# Patient Record
Sex: Female | Born: 1955
Health system: Southern US, Community
[De-identification: ages and names within clinical notes are randomized; demographics above are authoritative.]

## PROBLEM LIST (undated history)

## (undated) DIAGNOSIS — E785 Hyperlipidemia, unspecified: Secondary | ICD-10-CM

## (undated) DIAGNOSIS — I10 Essential (primary) hypertension: Secondary | ICD-10-CM

## (undated) DIAGNOSIS — R7309 Other abnormal glucose: Secondary | ICD-10-CM

## (undated) DIAGNOSIS — L659 Nonscarring hair loss, unspecified: Secondary | ICD-10-CM

## (undated) DIAGNOSIS — M1710 Unilateral primary osteoarthritis, unspecified knee: Secondary | ICD-10-CM

## (undated) DIAGNOSIS — R7303 Prediabetes: Secondary | ICD-10-CM

## (undated) DIAGNOSIS — E119 Type 2 diabetes mellitus without complications: Secondary | ICD-10-CM

## (undated) DIAGNOSIS — M179 Osteoarthritis of knee, unspecified: Secondary | ICD-10-CM

## (undated) DIAGNOSIS — N83209 Unspecified ovarian cyst, unspecified side: Secondary | ICD-10-CM

## (undated) DIAGNOSIS — E559 Vitamin D deficiency, unspecified: Secondary | ICD-10-CM

## (undated) DIAGNOSIS — D259 Leiomyoma of uterus, unspecified: Secondary | ICD-10-CM

## (undated) HISTORY — DX: Type 2 diabetes mellitus without complications: E11.9

## (undated) HISTORY — DX: Essential (primary) hypertension: I10

## (undated) HISTORY — DX: Hyperlipidemia, unspecified: E78.5

## (undated) HISTORY — PX: COLONOSCOPY: SHX174

## (undated) HISTORY — DX: Other abnormal glucose: R73.09

## (undated) HISTORY — PX: CYSTECTOMY: SUR359

## (undated) HISTORY — PX: FOOT SURGERY: SHX648

---

## 1998-08-23 ENCOUNTER — Ambulatory Visit (HOSPITAL_COMMUNITY): Admission: RE | Admit: 1998-08-23 | Discharge: 1998-08-23 | Payer: Self-pay | Admitting: Obstetrics

## 1998-08-23 ENCOUNTER — Encounter: Payer: Self-pay | Admitting: Obstetrics

## 1999-05-17 ENCOUNTER — Other Ambulatory Visit: Admission: RE | Admit: 1999-05-17 | Discharge: 1999-05-17 | Payer: Self-pay | Admitting: Obstetrics

## 1999-06-06 ENCOUNTER — Encounter: Payer: Self-pay | Admitting: Urology

## 1999-06-06 ENCOUNTER — Ambulatory Visit (HOSPITAL_COMMUNITY): Admission: RE | Admit: 1999-06-06 | Discharge: 1999-06-06 | Payer: Self-pay | Admitting: Urology

## 1999-10-19 ENCOUNTER — Ambulatory Visit (HOSPITAL_COMMUNITY): Admission: RE | Admit: 1999-10-19 | Discharge: 1999-10-19 | Payer: Self-pay | Admitting: Obstetrics

## 1999-10-19 ENCOUNTER — Encounter: Payer: Self-pay | Admitting: Obstetrics

## 2000-05-06 ENCOUNTER — Other Ambulatory Visit: Admission: RE | Admit: 2000-05-06 | Discharge: 2000-05-06 | Payer: Self-pay | Admitting: Obstetrics

## 2000-11-08 ENCOUNTER — Ambulatory Visit (HOSPITAL_COMMUNITY): Admission: RE | Admit: 2000-11-08 | Discharge: 2000-11-08 | Payer: Self-pay | Admitting: Obstetrics

## 2000-11-08 ENCOUNTER — Encounter: Payer: Self-pay | Admitting: Obstetrics

## 2001-02-28 ENCOUNTER — Encounter: Payer: Self-pay | Admitting: Obstetrics

## 2001-02-28 ENCOUNTER — Ambulatory Visit (HOSPITAL_COMMUNITY): Admission: RE | Admit: 2001-02-28 | Discharge: 2001-02-28 | Payer: Self-pay | Admitting: Obstetrics

## 2001-09-09 ENCOUNTER — Encounter: Payer: Self-pay | Admitting: Obstetrics

## 2001-09-09 ENCOUNTER — Inpatient Hospital Stay (HOSPITAL_COMMUNITY): Admission: AD | Admit: 2001-09-09 | Discharge: 2001-09-09 | Payer: Self-pay | Admitting: Obstetrics

## 2001-11-12 ENCOUNTER — Ambulatory Visit (HOSPITAL_COMMUNITY): Admission: RE | Admit: 2001-11-12 | Discharge: 2001-11-12 | Payer: Self-pay | Admitting: Obstetrics

## 2001-11-12 ENCOUNTER — Encounter: Payer: Self-pay | Admitting: Obstetrics

## 2002-11-26 ENCOUNTER — Encounter: Payer: Self-pay | Admitting: Obstetrics and Gynecology

## 2002-11-26 ENCOUNTER — Ambulatory Visit (HOSPITAL_COMMUNITY): Admission: RE | Admit: 2002-11-26 | Discharge: 2002-11-26 | Payer: Self-pay | Admitting: Obstetrics and Gynecology

## 2002-12-07 ENCOUNTER — Other Ambulatory Visit: Admission: RE | Admit: 2002-12-07 | Discharge: 2002-12-07 | Payer: Self-pay | Admitting: Obstetrics and Gynecology

## 2003-02-19 ENCOUNTER — Encounter: Payer: Self-pay | Admitting: Obstetrics and Gynecology

## 2003-02-19 ENCOUNTER — Ambulatory Visit (HOSPITAL_COMMUNITY): Admission: RE | Admit: 2003-02-19 | Discharge: 2003-02-19 | Payer: Self-pay | Admitting: Obstetrics and Gynecology

## 2003-03-04 ENCOUNTER — Encounter: Payer: Self-pay | Admitting: Obstetrics and Gynecology

## 2003-03-04 ENCOUNTER — Ambulatory Visit (HOSPITAL_COMMUNITY): Admission: RE | Admit: 2003-03-04 | Discharge: 2003-03-04 | Payer: Self-pay | Admitting: Obstetrics and Gynecology

## 2003-05-26 ENCOUNTER — Observation Stay (HOSPITAL_COMMUNITY): Admission: RE | Admit: 2003-05-26 | Discharge: 2003-05-27 | Payer: Self-pay | Admitting: Obstetrics and Gynecology

## 2003-05-26 ENCOUNTER — Encounter (INDEPENDENT_AMBULATORY_CARE_PROVIDER_SITE_OTHER): Payer: Self-pay

## 2003-05-30 ENCOUNTER — Inpatient Hospital Stay (HOSPITAL_COMMUNITY): Admission: AD | Admit: 2003-05-30 | Discharge: 2003-05-30 | Payer: Self-pay | Admitting: Obstetrics and Gynecology

## 2003-06-26 HISTORY — PX: INCONTINENCE SURGERY: SHX676

## 2003-12-08 ENCOUNTER — Ambulatory Visit (HOSPITAL_COMMUNITY): Admission: RE | Admit: 2003-12-08 | Discharge: 2003-12-08 | Payer: Self-pay | Admitting: Obstetrics and Gynecology

## 2004-01-04 ENCOUNTER — Other Ambulatory Visit: Admission: RE | Admit: 2004-01-04 | Discharge: 2004-01-04 | Payer: Self-pay | Admitting: Obstetrics and Gynecology

## 2004-06-25 DIAGNOSIS — E785 Hyperlipidemia, unspecified: Secondary | ICD-10-CM

## 2004-06-25 HISTORY — DX: Hyperlipidemia, unspecified: E78.5

## 2004-12-22 ENCOUNTER — Ambulatory Visit (HOSPITAL_COMMUNITY): Admission: RE | Admit: 2004-12-22 | Discharge: 2004-12-22 | Payer: Self-pay | Admitting: Obstetrics and Gynecology

## 2005-01-08 ENCOUNTER — Other Ambulatory Visit: Admission: RE | Admit: 2005-01-08 | Discharge: 2005-01-08 | Payer: Self-pay | Admitting: Obstetrics and Gynecology

## 2005-01-19 ENCOUNTER — Ambulatory Visit: Payer: Self-pay | Admitting: Internal Medicine

## 2005-12-19 ENCOUNTER — Ambulatory Visit: Payer: Self-pay | Admitting: Internal Medicine

## 2005-12-25 ENCOUNTER — Ambulatory Visit (HOSPITAL_COMMUNITY): Admission: RE | Admit: 2005-12-25 | Discharge: 2005-12-25 | Payer: Self-pay | Admitting: Obstetrics and Gynecology

## 2006-06-11 ENCOUNTER — Ambulatory Visit: Payer: Self-pay | Admitting: Internal Medicine

## 2006-06-11 LAB — CONVERTED CEMR LAB
Creatinine,U: 178.7 mg/dL
Microalb, Ur: 0.7 mg/dL (ref 0.0–1.9)
Potassium: 4 meq/L (ref 3.5–5.1)

## 2006-12-30 ENCOUNTER — Ambulatory Visit (HOSPITAL_COMMUNITY): Admission: RE | Admit: 2006-12-30 | Discharge: 2006-12-30 | Payer: Self-pay | Admitting: Obstetrics and Gynecology

## 2006-12-30 ENCOUNTER — Telehealth (INDEPENDENT_AMBULATORY_CARE_PROVIDER_SITE_OTHER): Payer: Self-pay | Admitting: *Deleted

## 2006-12-31 ENCOUNTER — Ambulatory Visit: Payer: Self-pay | Admitting: Internal Medicine

## 2007-06-03 ENCOUNTER — Encounter: Payer: Self-pay | Admitting: Internal Medicine

## 2008-01-01 ENCOUNTER — Ambulatory Visit (HOSPITAL_COMMUNITY): Admission: RE | Admit: 2008-01-01 | Discharge: 2008-01-01 | Payer: Self-pay | Admitting: Obstetrics and Gynecology

## 2008-02-26 ENCOUNTER — Ambulatory Visit: Payer: Self-pay | Admitting: Internal Medicine

## 2008-02-26 DIAGNOSIS — E1169 Type 2 diabetes mellitus with other specified complication: Secondary | ICD-10-CM | POA: Insufficient documentation

## 2008-02-26 DIAGNOSIS — E785 Hyperlipidemia, unspecified: Secondary | ICD-10-CM | POA: Insufficient documentation

## 2008-02-26 DIAGNOSIS — E1159 Type 2 diabetes mellitus with other circulatory complications: Secondary | ICD-10-CM | POA: Insufficient documentation

## 2008-02-26 DIAGNOSIS — I10 Essential (primary) hypertension: Secondary | ICD-10-CM | POA: Insufficient documentation

## 2008-02-26 LAB — CONVERTED CEMR LAB
HDL goal, serum: 40 mg/dL
LDL Goal: 160 mg/dL

## 2008-02-28 LAB — CONVERTED CEMR LAB
AST: 29 units/L (ref 0–37)
Alkaline Phosphatase: 67 units/L (ref 39–117)
BUN: 14 mg/dL (ref 6–23)
Basophils Absolute: 0.1 10*3/uL (ref 0.0–0.1)
Basophils Relative: 0.8 % (ref 0.0–3.0)
Bilirubin, Direct: 0.1 mg/dL (ref 0.0–0.3)
Calcium: 9.2 mg/dL (ref 8.4–10.5)
Chloride: 108 meq/L (ref 96–112)
Eosinophils Relative: 0.9 % (ref 0.0–5.0)
HCT: 36.7 % (ref 36.0–46.0)
Hemoglobin: 12.6 g/dL (ref 12.0–15.0)
Hgb A1c MFr Bld: 6 % (ref 4.6–6.0)
Lymphocytes Relative: 15.7 % (ref 12.0–46.0)
Neutro Abs: 7.4 10*3/uL (ref 1.4–7.7)
Platelets: 266 10*3/uL (ref 150–400)
Potassium: 4 meq/L (ref 3.5–5.1)
RBC: 4.07 M/uL (ref 3.87–5.11)
Sodium: 141 meq/L (ref 135–145)
TSH: 2.06 microintl units/mL (ref 0.35–5.50)
Total Protein: 7.3 g/dL (ref 6.0–8.3)

## 2008-03-02 ENCOUNTER — Encounter (INDEPENDENT_AMBULATORY_CARE_PROVIDER_SITE_OTHER): Payer: Self-pay | Admitting: *Deleted

## 2008-03-05 ENCOUNTER — Ambulatory Visit: Payer: Self-pay | Admitting: Internal Medicine

## 2008-03-05 LAB — CONVERTED CEMR LAB
OCCULT 1: NEGATIVE
OCCULT 2: NEGATIVE

## 2008-03-08 ENCOUNTER — Encounter (INDEPENDENT_AMBULATORY_CARE_PROVIDER_SITE_OTHER): Payer: Self-pay | Admitting: *Deleted

## 2008-09-29 ENCOUNTER — Telehealth (INDEPENDENT_AMBULATORY_CARE_PROVIDER_SITE_OTHER): Payer: Self-pay | Admitting: *Deleted

## 2009-01-06 ENCOUNTER — Ambulatory Visit (HOSPITAL_COMMUNITY): Admission: RE | Admit: 2009-01-06 | Discharge: 2009-01-06 | Payer: Self-pay | Admitting: Obstetrics and Gynecology

## 2009-01-17 ENCOUNTER — Ambulatory Visit: Payer: Self-pay | Admitting: Internal Medicine

## 2009-03-16 ENCOUNTER — Ambulatory Visit: Payer: Self-pay | Admitting: Internal Medicine

## 2009-03-21 ENCOUNTER — Encounter (INDEPENDENT_AMBULATORY_CARE_PROVIDER_SITE_OTHER): Payer: Self-pay | Admitting: *Deleted

## 2009-03-21 LAB — CONVERTED CEMR LAB
Alkaline Phosphatase: 72 units/L (ref 39–117)
Basophils Absolute: 0 10*3/uL (ref 0.0–0.1)
Basophils Relative: 0.6 % (ref 0.0–3.0)
Bilirubin, Direct: 0 mg/dL (ref 0.0–0.3)
Eosinophils Relative: 2.9 % (ref 0.0–5.0)
HCT: 36.9 % (ref 36.0–46.0)
HDL: 43.1 mg/dL (ref >39.00)
Lymphs Abs: 2.2 10*3/uL (ref 0.7–4.0)
MCHC: 33.8 g/dL (ref 30.0–36.0)
MCV: 89.9 fL (ref 78.0–100.0)
Monocytes Absolute: 0.5 10*3/uL (ref 0.1–1.0)
Neutro Abs: 2.6 10*3/uL (ref 1.4–7.7)
TSH: 2.88 microintl units/mL (ref 0.35–5.50)
Total Bilirubin: 0.6 mg/dL (ref 0.3–1.2)
Total Protein: 7 g/dL (ref 6.0–8.3)
Triglycerides: 61 mg/dL (ref 0.0–149.0)

## 2009-03-24 ENCOUNTER — Ambulatory Visit: Payer: Self-pay | Admitting: Internal Medicine

## 2009-03-24 DIAGNOSIS — R7303 Prediabetes: Secondary | ICD-10-CM

## 2009-03-24 DIAGNOSIS — E119 Type 2 diabetes mellitus without complications: Secondary | ICD-10-CM | POA: Insufficient documentation

## 2009-05-24 ENCOUNTER — Ambulatory Visit (HOSPITAL_BASED_OUTPATIENT_CLINIC_OR_DEPARTMENT_OTHER): Admission: RE | Admit: 2009-05-24 | Discharge: 2009-05-24 | Payer: Self-pay | Admitting: Surgery

## 2009-06-25 HISTORY — PX: LIPOMA EXCISION: SHX5283

## 2010-01-12 ENCOUNTER — Ambulatory Visit (HOSPITAL_COMMUNITY): Admission: RE | Admit: 2010-01-12 | Discharge: 2010-01-12 | Payer: Self-pay | Admitting: Obstetrics and Gynecology

## 2010-04-19 ENCOUNTER — Ambulatory Visit: Payer: Self-pay | Admitting: Internal Medicine

## 2010-04-19 DIAGNOSIS — IMO0002 Reserved for concepts with insufficient information to code with codable children: Secondary | ICD-10-CM | POA: Insufficient documentation

## 2010-04-20 ENCOUNTER — Ambulatory Visit: Payer: Self-pay | Admitting: Internal Medicine

## 2010-04-21 LAB — CONVERTED CEMR LAB
CO2: 29 meq/L (ref 19–32)
Calcium: 8.9 mg/dL (ref 8.4–10.5)
Glucose, Bld: 83 mg/dL (ref 70–99)
Sodium: 134 meq/L — ABNORMAL LOW (ref 135–145)

## 2010-06-22 ENCOUNTER — Ambulatory Visit: Payer: Self-pay | Admitting: Internal Medicine

## 2010-06-22 ENCOUNTER — Encounter: Payer: Self-pay | Admitting: Internal Medicine

## 2010-06-22 ENCOUNTER — Ambulatory Visit
Admission: RE | Admit: 2010-06-22 | Discharge: 2010-06-22 | Payer: Self-pay | Source: Home / Self Care | Attending: Internal Medicine | Admitting: Internal Medicine

## 2010-06-22 DIAGNOSIS — R635 Abnormal weight gain: Secondary | ICD-10-CM | POA: Insufficient documentation

## 2010-06-22 DIAGNOSIS — M546 Pain in thoracic spine: Secondary | ICD-10-CM | POA: Insufficient documentation

## 2010-06-23 LAB — CONVERTED CEMR LAB
Albumin: 3.5 g/dL (ref 3.5–5.2)
Alkaline Phosphatase: 92 units/L (ref 39–117)
Basophils Absolute: 0 10*3/uL (ref 0.0–0.1)
Cholesterol: 164 mg/dL (ref 0–200)
Eosinophils Relative: 1.5 % (ref 0.0–5.0)
HDL: 43.6 mg/dL (ref >39.00)
Hemoglobin: 13.7 g/dL (ref 12.0–15.0)
LDL Cholesterol: 104 mg/dL — ABNORMAL HIGH (ref 0–99)
Lymphs Abs: 1.9 10*3/uL (ref 0.7–4.0)
MCHC: 33.1 g/dL (ref 30.0–36.0)
MCV: 89.1 fL (ref 78.0–100.0)
Monocytes Absolute: 0.5 10*3/uL (ref 0.1–1.0)
Neutro Abs: 3.2 10*3/uL (ref 1.4–7.7)
RDW: 15.1 % — ABNORMAL HIGH (ref 11.5–14.6)
TSH: 1.89 microintl units/mL (ref 0.35–5.50)
Total CHOL/HDL Ratio: 4
WBC: 5.7 10*3/uL (ref 4.5–10.5)

## 2010-07-25 NOTE — Assessment & Plan Note (Signed)
Summary: INCREASED BP--DISCUSS MED/KB   Vital Signs:  Patient profile:   55 year old female Weight:      256 pounds BMI:     44.10 Temp:     98.6 degrees F oral Pulse rate:   76 / minute Resp:     15 per minute BP sitting:   136 / 84  (left arm) Cuff size:   large  Vitals Entered By: Shonna Chock CMA (April 19, 2010 4:16 PM) CC: Increased B/P   CC:  Increased B/P.  History of Present Illness: Hypertension Follow-Up      This is a 55 year old woman who presents for Hypertension follow-up.  Her BP was 160/ 90@ Gyn. The patient reports urinary frequency( she D/Ced HCTZ in 12/2009), but denies lightheadedness, headaches, and fatigue.  The patient denies the following associated symptoms: chest pain, chest pressure, exercise intolerance, dyspnea, palpitations, syncope, leg edema, and pedal edema.  Compliance with medications (by patient report) has been sporadic, missing doses.  The patient reports that dietary compliance has been poor.  The patient reports no regular exercise.  Adjunctive measures currently used by the patient include  some salt restriction.                                                                                   "Electrical shock" @ bra level R > L intermittently 2X/ week for seconds  w/o specific trigger. She questions "shingles" recurrence in samr area . No back trauma.  Allergies: 1)  ! Benazepril Hcl (Benazepril Hcl)  Review of Systems GU:  Denies incontinence. Derm:  Denies lesion(s) and rash. Neuro:  Denies brief paralysis, disturbances in coordination, numbness, poor balance, tingling, and weakness.  Physical Exam  General:  well-nourished,in no acute distress; alert,appropriate and cooperative throughout examination Eyes:  No corneal or conjunctival inflammation noted. Perrla. Funduscopic exam benign, without hemorrhages, exudates or papilledema. Arteriolar narrowing Lungs:  Normal respiratory effort, chest expands symmetrically. Lungs are clear to  auscultation, no crackles or wheezes. Heart:  Normal rate and regular rhythm. S1 and S2 normal without gallop, murmur, click, rub or other extra sounds. Abdomen:  Bowel sounds positive,abdomen soft and non-tender without masses, organomegaly or hernias noted. No AAA or renal bruits Pulses:  R and L carotid,radial,dorsalis pedis and posterior tibial pulses are full and equal bilaterally Extremities:  No clubbing, cyanosis, edema, or deformity noted with normal full range of motion of all joints.   Neurologic:  alert & oriented X3, strength normal in all extremities, and DTRs symmetrical and normal.   Skin:  Intact without suspicious lesions or rashes Psych:  memory intact for recent and remote, normally interactive, and good eye contact.     Impression & Recommendations:  Problem # 1:  HYPERTENSION (ICD-401.9)  The following medications were removed from the medication list:    Hydrochlorothiazide 25 Mg Tabs (Hydrochlorothiazide) .Marland Kitchen... 1 by mouth once daily    Labetalol Hcl 300 Mg Tabs (Labetalol hcl) .Marland Kitchen... Take 1 two times a day Her updated medication list for this problem includes:    Amlodipine Besylate 5 Mg Tabs (Amlodipine besylate) .Marland Kitchen... 1 once daily    Metoprolol Succinate 100 Mg  Xr24h-tab (Metoprolol succinate) .Marland Kitchen... 1 once daily  Problem # 2:  RADICULOPATHY (ICD-729.2) Thoracic , intermittent  Complete Medication List: 1)  Tramadol Hcl 50 Mg Tabs (Tramadol hcl) .Marland Kitchen.. 1 q 6 hrs as needed 2)  Amlodipine Besylate 5 Mg Tabs (Amlodipine besylate) .Marland Kitchen.. 1 once daily 3)  Metoprolol Succinate 100 Mg Xr24h-tab (Metoprolol succinate) .Marland Kitchen.. 1 once daily  Patient Instructions: 1)  Check your Blood Pressure regularly. If it is above: 135/85 ON AVERAGE  you should make an appointment.Keep a Diary of triggers for thoracic neuritis. Consider stretch aerobics or yoga for the back. Please consider fasting labs:BMP , ICD-9:401.9 Prescriptions: METOPROLOL SUCCINATE 100 MG XR24H-TAB (METOPROLOL  SUCCINATE) 1 once daily  #30 x 5   Entered and Authorized by:   Marga Melnick MD   Signed by:   Marga Melnick MD on 04/19/2010   Method used:   Print then Give to Patient   RxID:   [id number] AMLODIPINE BESYLATE 5 MG TABS (AMLODIPINE BESYLATE) 1 once daily  #30 x 5   Entered and Authorized by:   Marga Melnick MD   Signed by:   Marga Melnick MD on 04/19/2010   Method used:   Print then Give to Patient   RxID:   [id number]    Orders Added: 1)  Est. Patient Level IV [84696]

## 2010-07-27 NOTE — Assessment & Plan Note (Signed)
Summary: cpx/cbs   Vital Signs:  Patient profile:   55 year old female Height:      64.5 inches Weight:      252.2 pounds Temp:     98.2 degrees F oral Pulse rate:   60 / minute Pulse rhythm:   regular Resp:     15 per minute BP sitting:   124 / 82  (right arm) Cuff size:   large  Vitals Entered By: Almeta Monas CMA Duncan Dull) (June 22, 2010 12:17 PM) CC: CPX--No problems or concerns, Back pain   CC:  CPX--No problems or concerns and Back pain.  History of Present Illness:     [name] is here for a  physical; she continues to have intermittent sharp & burning  mid  thoracic spine pain.  The patient denies fever, chills, weakness, loss of sensation, fecal incontinence, urinary incontinence, and urinary retention.  The pain  does not radiate; it  is made better by not wearing bra . It also improved with  this holiday vacation. 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 Rx: none. She has gained 20 # over 12 months; bra size has increased from 38 DDD to 42 DDD.                                                                                                                                                       Hypertension Follow-Up:  The patient denies lightheadedness, urinary frequency, headaches, edema, and fatigue.  The patient denies the following associated symptoms: chest pain, chest pressure, exercise intolerance, dyspnea, palpitations, and syncope.  Compliance with medications (by patient report) has been near 100%.  Adjunctive measures currently used by the patient include  modified salt restriction.    Current Medications (verified): 1)  Amlodipine Besylate 5 Mg Tabs (Amlodipine Besylate) .Marland Kitchen.. 1 Once Daily 2)  Metoprolol Succinate 100 Mg Xr24h-Tab (Metoprolol Succinate) .Marland Kitchen.. 1 Once Daily  Allergies (verified): 1)  ! 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  weight up 20#  Physical Exam  General:  well-nourished;alert,appropriate and cooperative throughout examination Head:  Normocephalic and atraumatic without obvious abnormalities. Eyes:  No corneal or conjunctival inflammation noted. EOMI. Perrla. Funduscopic exam benign, without hemorrhages, exudates or papilledema.  Ears:  External ear exam shows no significant lesions or deformities.  Otoscopic examination reveals clear canals, tympanic membranes are intact bilaterally without bulging, retraction, inflammation or discharge. Hearing is grossly normal bilaterally. Nose:  External nasal examination shows no deformity or inflammation. Nasal mucosa are pink and moist without lesions or exudates. Mouth:  Oral mucosa and oropharynx without lesions or exudates.  Teeth in good repair. Neck:  No deformities, masses, or tenderness noted. Lungs:  Normal respiratory effort, chest expands symmetrically. Lungs are clear to auscultation, no crackles or wheezes. Heart:  Normal rate and regular rhythm. S1 and S2 normal without gallop, murmur, click, rub.S4 Abdomen:  Bowel sounds positive,abdomen soft and  non-tender without masses, organomegaly or hernias noted. Genitalia:  Dr Dareen Piano Msk:  No deformity or scoliosis noted of thoracic or lumbar spine.   Pulses:  R and L carotid,radial,dorsalis pedis and posterior tibial pulses are full and equal bilaterally Extremities:  No clubbing, cyanosis, edema, or deformity noted with normal full range of motion of all joints.   Crepitus L knee Neurologic:  alert & oriented X3, strength normal in all extremities, and DTRs symmetrical and normal.   Skin:  Hyperpigmentation mid thoracic spine Cervical Nodes:  No lymphadenopathy noted Axillary Nodes:  No palpable lymphadenopathy Psych:  memory intact for recent and remote, normally interactive, and good eye contact.     Impression & Recommendations:  Problem # 1:  ROUTINE GENERAL MEDICAL EXAM@HEALTH  CARE FACL (ICD-V70.0)  Orders: EKG w/ Interpretation (93000) Venipuncture (04540) TLB-Lipid Panel (80061-LIPID) TLB-CBC Platelet - w/Differential (85025-CBCD) TLB-Hepatic/Liver Function Pnl (80076-HEPATIC) TLB-TSH (Thyroid Stimulating Hormone) (84443-TSH) T-Thoracic Spine 2 Views (98119JY)  Problem # 2:  BACK PAIN, THORACIC REGION (ICD-724.1)  ? related to  large breasts  The following medications were removed from the medication list:    Tramadol Hcl 50 Mg Tabs (Tramadol hcl) .Marland Kitchen... 1 q 6 hrs as needed  Orders: T-Thoracic Spine 2 Views [phone])  Problem # 3:  HYPERTENSION (ICD-401.9)  Her updated medication list for this problem includes:    Amlodipine Besylate 5 Mg Tabs (Amlodipine besylate) .Marland Kitchen... 1 once daily    Metoprolol Succinate 100 Mg Xr24h-tab (Metoprolol succinate) .Marland Kitchen... 1 once daily  Problem # 4:  WEIGHT GAIN (ICD-783.1)  Complete Medication List: 1)  Amlodipine Besylate 5 Mg Tabs (Amlodipine besylate) .Marland Kitchen.. 1 once daily 2)  Metoprolol Succinate 100 Mg Xr24h-tab (Metoprolol succinate) .Marland Kitchen.. 1 once daily  Patient Instructions: 1)  Consider low carb nutrition program such as The  Flat Belly Diet.   Orders Added: 1)  Est. Patient 40-64 years [99396] 2)  EKG w/ Interpretation [93000] 3)  Venipuncture [36415] 4)  TLB-Lipid Panel [80061-LIPID] 5)  TLB-CBC Platelet - w/Differential [85025-CBCD] 6)  TLB-Hepatic/Liver Function Pnl [80076-HEPATIC] 7)  TLB-TSH (Thyroid Stimulating Hormone) [84443-TSH] 8)  T-Thoracic Spine 2 Views [72070TC]

## 2010-09-27 LAB — DIFFERENTIAL
Eosinophils Absolute: 0.1 10*3/uL (ref 0.0–0.7)
Lymphs Abs: 1.9 10*3/uL (ref 0.7–4.0)
Monocytes Absolute: 0.5 10*3/uL (ref 0.1–1.0)
Neutrophils Relative %: 63 % (ref 43–77)

## 2010-09-27 LAB — CBC
HCT: 35.4 % — ABNORMAL LOW (ref 36.0–46.0)
MCHC: 34.4 g/dL (ref 30.0–36.0)
Platelets: 230 10*3/uL (ref 150–400)
RBC: 3.97 MIL/uL (ref 3.87–5.11)
WBC: 6.8 10*3/uL (ref 4.0–10.5)

## 2010-09-27 LAB — BASIC METABOLIC PANEL
BUN: 17 mg/dL (ref 6–23)
Chloride: 105 mEq/L (ref 96–112)
GFR calc non Af Amer: 59 mL/min — ABNORMAL LOW (ref >60)
Sodium: 139 mEq/L (ref 135–145)

## 2010-10-14 ENCOUNTER — Other Ambulatory Visit: Payer: Self-pay | Admitting: Internal Medicine

## 2010-10-16 ENCOUNTER — Telehealth: Payer: Self-pay | Admitting: *Deleted

## 2010-10-16 NOTE — Telephone Encounter (Signed)
Toprol is on manufacture back order. Pharmacy does not know when this issue will resolve. Would like to change to Lopressor.

## 2010-10-16 NOTE — Telephone Encounter (Signed)
Change to metoprolol;s he would be the same total daily dose but in twice a day form. Example: if on Toprol XL  50 daily; then  metoprolol 25 mg twice a day would be substituted.

## 2010-10-17 MED ORDER — METOPROLOL TARTRATE 25 MG PO TABS: 25.0000 mg | ORAL_TABLET | Freq: Two times a day (BID) | ORAL | Status: DC

## 2010-11-10 NOTE — Op Note (Signed)
[name], [name]                          ACCOUNT NO.:  [id number]   MEDICAL RECORD NO.:  [id number]                   PATIENT TYPE:  OBV   LOCATION:  9312                                 FACILITY:  [facility]   PHYSICIAN:  Randye Lobo, M.D.                DATE OF BIRTH:  08/22/1955   DATE OF PROCEDURE:  05/26/2003  DATE OF DISCHARGE:                                 OPERATIVE REPORT   PREOPERATIVE DIAGNOSIS:  Urethral diverticulum.   POSTOPERATIVE DIAGNOSIS:  Urethral diverticulum.   PROCEDURE:  Urethral diverticulectomy, closure of urethrotomy, with  cystoscopy.   SURGEON:  Randye Lobo, M.D.   ANESTHESIA:  General endotracheal.   FLUIDS REPLACED:  2250 mL Ringer's lactate.   ESTIMATED BLOOD LOSS:  300 mL.   URINE OUTPUT:  150 mL.   INDICATION FOR PROCEDURE:  The patient is a 55 year old gravida 1, para 0-0-  1-0, African-American female who presented with a known urethral  diverticulum and dyspareunia urinary incontinence.  The patient specifically  denied any symptoms of leakage of urine with coughing and sneezing, and she  reported postvoid dribbling.  The patient had a voiding cystourethrogram  performed approximately three years ago, which documented the diverticulum.  She had a follow-up voiding cystourethrogram when she presented for  evaluation in August 2004.  It was thought that possibly two urethral  diverticula existed and an MRI was therefore performed to further delineate  the anatomy.  An MRI documented one urethral diverticulum measuring 2 x 1 cm  on the right side of the urethra, displacing the urethra to the left.  The  urethral diverticulum contained a thin stalk distally and anteriorly.   The patient wished for surgical treatment of the urethral diverticulum, and  a plan was made to proceed with the diverticulectomy and cystoscopy after  risks, benefits, and alternatives were discussed with the patient, including  but not limited to potential  worsening of her incontinence.  The patient  chose to proceed.   FINDINGS:  Examination under anesthesia revealed a 1.5 cm suburethral  diverticulum.  The diverticulum appeared to be at the level of the mid- to  proximal urethra and was noted to be on the right side of the urethra.  The  stalk could not be clearly identified but was thought to exist again more  proximally and to the right side.  During the course of the dissection of  the diverticulum, the urethra was entered and repaired.   Cystoscopy performed at the beginning of the procedure documented a normal  bladder throughout 360 degrees.  The ureters were noted to be patent.  The  stalk of the diverticulum could not be clearly identified.  At the  vesicourethral junction, there was a slight widening noted of the urethra to  the right side.  However, this did not fit the description of the  diverticulum noted with the MRI.   Cystoscopy  performed at the end of the procedure documented the bladder to  be normal throughout 360 degrees.  The ureters were noted to be patent  bilaterally.  The bladder dome and the trigone regions were visualized.  The  urethra was noted to be intact.  There was evidence of Vicryl suture  consistent with repair of the urethrotomy.   SPECIMENS:  The urethral diverticulum was sent to pathology.   DESCRIPTION OF PROCEDURE:  The patient was re-identified in the preoperative  hold area.  The patient did receive Ancef 1 g intravenously for antibiotic  prophylaxis.  The patient was brought to the operating room, where general  endotracheal anesthesia was induced.  The patient was then placed in the  dorsal lithotomy position.  The patient's vagina and perineum were then  sterilely prepped and draped.   Cystoscopy was performed first, and the findings are as noted above.  Allis  clamps were then used to mark the midline of the vaginal mucosa overlying  the urethra and the mucosa was injected with 0.5%  lidocaine with 1:200,000  of epinephrine.  A vertical midline incision was then created with a  scalpel, and the subvaginal tissue was dissected off of the overlying  vaginal mucosa using sharp dissection bilaterally.  Allis clamps were used  to retract the vaginal mucosa so that the diverticulum could be identified.  The endopelvic fascia overlying the diverticulum was then incised in a  vertical fashion, and sharp dissection was used to dissect the diverticulum  from the surrounding endopelvic fascia.  This dissection continued off to  the right of the urethra.  During the course of the dissection, the urethra  was entered in this region.  The area involved for the urethrotomy measured  approximately between 1.5 and 2 cm.  The diverticulum was clamped with a  tonsil clamp at its base and the specimen was excised, essentially  performing a partial ablation technique.  The bladder was filled in a  retrograde fashion with sterile crystalloid solution at this time, and the  edges of the urethra were identified.  The urethra was closed using  interrupted sutures of 4-0 Vicryl.  The base of the diverticular stalk was  then closed with two interrupted sutures of 4-0 Vicryl.  The Silastic Foley  catheter was in place during the course of this suturing.  Again the bladder  was retrograde filled with crystalloid solution, and the urethra was noted  to be intact and without leakage of fluid.  A vest-over-pants closure of the  periurethral fascia was next performed using 2-0 Vicryl suture.  This  essentially allowed the closure of two layers of endopelvic fascia overlying  the urethra.  Final cystoscopy was performed at this time after indigo  carmine was injected intravenously.  The repair was noted to be intact and  without evidence of leakage.  The bladder was drained and the Silastic Foley  was replaced, and the vagina was then closed with a running locked suture of 2-0 Vicryl.  A small tear in  the vaginal mucosa up to the patient's left-  hand side of the vaginal mucosa was repaired with a running locked suture of  3-0 Vicryl.  Hemostasis was noted to be excellent at this time.  A vaginal  packing with gauze moistened with Premarin cream was then placed in the  vagina.   This concluded the patient's surgery.  All needle, instrument, and sponge  counts were correct.  Randye Lobo, M.D.    BES/MEDQ  D:  05/26/2003  T:  05/27/2003  Job:  [id number]

## 2010-11-10 NOTE — H&P (Signed)
[name], [name]                          ACCOUNT NO.:  [id number]   MEDICAL RECORD NO.:  [id number]                   PATIENT TYPE:  AMB   LOCATION:  SDC                                  FACILITY:  [facility]   PHYSICIAN:  Randye Lobo, M.D.                DATE OF BIRTH:  11-Jun-1956   DATE OF ADMISSION:  DATE OF DISCHARGE:                                HISTORY & PHYSICAL   CHIEF COMPLAINT:  Urinary incontinence.   HISTORY OF PRESENT ILLNESS:  The patient is a 55 year old, gravida 1, para 0-  0-1-0, African American female who presents with urinary incontinence of  approximately four years' duration.  The patient reports loss of urine which  can occur at any time.  The patient denies any leakage of urine with  coughing or sneezing.  She does report some urgency with urge incontinence.  The patient states that she has had a voiding cystourethrogram at the  [facility] approximately three years ago, at which time she was  diagnosed with a urethral diverticulum.  The patient has represented now out  of interest for proceeding with surgical treatment.  She reports urethral  discomfort with intercourse in addition to the leakage of urine.  The  patient had a repeat voiding cystourethrogram performed on February 19, 2003,  at which time the bladder was noted to be normal and there was no evidence  of vesicoureteral reflux.  With voiding, there were two areas adjacent to  the urethra, to the right and to the left, which were thought to represent  either two or one diverticulum.  In followup to this, the patient had an MRI  performed on March 04, 2003, at which time a single urethral diverticulum  measuring 2.1 cm in size was noted.  The diverticulum is on the right side  of the urethra and displaces the urethra slightly to the left.  The  diverticulum has a thin stalk distally and anteriorly on the urethra.  There  is no wide base seen to the diverticulum.   PAST OBSTETRIC AND  GYNECOLOGIC HISTORY:  Status post spontaneous abortion x  1.  The patient did not have a dilatation and evacuation performed.  The  patient does have a history of infertility.  She is not currently using any  contraception.  The patient has her menses on a monthly basis and reports no  intramenstrual bleeding.  She does have known uterine leiomyomata by  ultrasound.  The patient's last Pap smear was performed on December 07, 2002,  and was within normal limits.  Her last mammogram was performed on November 26, 2002, and was within normal limits with no evidence of malignancy.   PAST MEDICAL HISTORY:  1. Urinary tract infections.  2. Lipoma over the left distal clavicle.   PAST SURGICAL HISTORY:  1. Status post bilateral foot surgeries.  2. Status post excision of a pilonidal cyst  in the 1970s.   MEDICATIONS:  None.   ALLERGIES:  No known drug allergies.   SOCIAL HISTORY:  The patient denies the use of tobacco, alcohol, or illicit  drugs.   FAMILY HISTORY:  Noncontributory.   REVIEW OF SYSTEMS:  Unremarkable.   PHYSICAL EXAMINATION:  VITAL SIGNS:  The blood pressure is 130/78.  WEIGHT:  253 pounds.  GENERAL APPEARANCE:  The patient is [place] American female in no acute  distress.  NECK:  Negative for adenopathy and thyromegaly.  LUNGS:  Clear to auscultation bilaterally.  HEART:  S1 and S2 with a regular rate and rhythm.  ABDOMEN:  Soft and nontender and without evidence of hepatosplenomegaly or  organomegaly.  PELVIC:  Normal external genitalia.  Normal-appearing urethra.  The urethra  is noted to be tender upon palpation.  There is a fullness appreciated  overlying the urethra and the margins are not clearly delineated.  Speculum  exam demonstrates a normal cervix and vagina.  The uterus is noted to be  small and nontender.  No adnexal masses nor tenderness are appreciated.   IMPRESSION:  The patient is a 55 year old, gravida 1, para 0-0-1-0, African  American female with a  symptomatic urethral diverticulum.   PLAN:  Urethral diverticulectomy and cystoscopy will be performed at the  [facility] on May 26, 2003.  The risks, benefits,  and alternatives have been discussed with the patient, who wishes to  proceed.                                               Randye Lobo, M.D.    BES/MEDQ  D:  05/25/2003  T:  05/25/2003  Job:  [id number]

## 2010-11-10 NOTE — Discharge Summary (Signed)
[name], [name]                          ACCOUNT NO.:  [id number]   MEDICAL RECORD NO.:  [id number]                   PATIENT TYPE:  OBV   LOCATION:  9312                                 FACILITY:  [facility]   PHYSICIAN:  Randye Lobo, M.D.                DATE OF BIRTH:  [birth year]   DATE OF ADMISSION:  05/26/2003  DATE OF DISCHARGE:  05/27/2003                                 DISCHARGE SUMMARY   ADMISSION DIAGNOSES:  Urethral diverticulum.   DISCHARGE DIAGNOSES:  1. Urethral diverticulum.  2. Status post urethral diverticulectomy, urethrotomy repair, cystoscopy.   OPERATION/PROCEDURE:  The patient underwent a urethral diverticulectomy with  urethrotomy repair and cystoscopy under the direction of Randye Lobo,  M.D. under the assistance of W. Lodema Hong, M.D. at the [facility]  [place] on May 26, 2003.   PERTINENT HISTORY AND PHYSICAL EXAMINATION:  The patient is a 55 year old  gravida 1, para 0-0-1-0 African-American female with a known history of a  urethral diverticulum by a voiding cystourethrogram approximately three  years prior who presented with painful intercourse and with postvoid urinary  incontinence and a desire for surgical repair.  The patient was noted to  have tenderness underneath the urethra with a soft tissue mass measuring  approximately 1.5 cm on physical examination.  The patient underwent a  repeat voiding cystourethrogram which suggested the possibility of two  diverticula, one to the right and one to the left of the urethra.  An MRI  was therefore ordered in follow-up which documented a single urethral  diverticulum measuring 2 cm x 1 cm in diameter.  There was a thin stalk  which attached the diverticulum to the distal urethra in an anterior  location.   HOSPITAL COURSE:  The patient was admitted on May 26, 2003 at which time  she underwent a urethral diverticulectomy with urethrotomy repair and  cystoscopy at the [facility] of [place].  Estimated blood loss from  surgery was 300 mL.  Cystoscopy performed at the end of the procedure  documented a normal bladder throughout 360 degrees.  The patient had a  normal bladder dome and trigone.  The ureters were noted to be patent  bilaterally.  There was evidence of a Vicryl suture for the urethrotomy  repair.  There was patency of the urethra.   Postoperatively the patient's surgical recovery was uncomplicated.  She was  admitted for a brief overnight stay.  The patient had Toradol for pain  control.  The patient was able to ambulate independently during her  hospitalization.  She had a vaginal packing which was removed on  postoperative day #1.  Her hemoglobin measured 9.7 and she was tolerating  this well.  Her white blood cell count was 6.3.  The patient used TED hose  for DVT prophylaxis.  The patient's urine output remained clear and yellow  during the hospitalization.  The patient was found to be in good condition and ready for discharge on  postoperative day #1.   DISCHARGE INSTRUCTIONS:  1. Discharged to home.  2. The patient will take the following medications:     a. Percocet one p.o. q.4-6h. p.r.n. pain.     b. Ibuprofen 600 mg p.o. q.6h. p.r.n.     c. Macrobid 100 mg p.o. q.h.s. x9 days.     d. Iron sulfate one p.o. b.i.d.  3. The patient will have decreased activity for the next six weeks.  4. The patient will take a regular diet.  5. The patient will follow up for catheter removal in nine days.  6. The patient will call if she experiences fever, increased pain, increased     vaginal drainage, increased vaginal bleeding, nausea and vomiting, or any     other concern.                                               Randye Lobo, M.D.    BES/MEDQ  D:  05/27/2003  T:  05/27/2003  Job:  [id number]

## 2010-12-07 ENCOUNTER — Ambulatory Visit (INDEPENDENT_AMBULATORY_CARE_PROVIDER_SITE_OTHER): Payer: Federal, State, Local not specified - PPO | Admitting: Family Medicine

## 2010-12-07 VITALS — BP 110/84 | Temp 97.8°F | Wt 255.2 lb

## 2010-12-07 DIAGNOSIS — R197 Diarrhea, unspecified: Secondary | ICD-10-CM | POA: Insufficient documentation

## 2010-12-07 MED ORDER — DIPHENOXYLATE-ATROPINE 2.5-0.025 MG PO TABS: 2.0000 | ORAL_TABLET | Freq: Four times a day (QID) | ORAL | Status: AC | PRN

## 2010-12-07 NOTE — Patient Instructions (Signed)
We'll notify you of your lab results Drink LOTS of water- you do not want to get dehydrated! Use the Lomotil as needed for diarrhea.  Take 2 pills to start and then decrease to 1 pill as needed.  Take w/ food to avoid upset stomach Call with any questions or concerns Hang in there!

## 2010-12-07 NOTE — Progress Notes (Signed)
  Subjective:    Patient ID: [name], female    DOB: 1955-08-04, 55 y.o.   MRN: [id number]  HPI Diarrhea- sxs started 3 days ago.  Having explosive loose/watery stools.  No blood.  'i've been on the bathroom for 2 hrs.  i went 4 times w/in 30 minutes'.  Denies abdominal pain.  Having loud gastric noises.  Vomited yesterday x1.  Denies nausea currently.  No fevers.  No one at home w/ similar sxs.  Denies new or different foods, recent travel, recent abx.  Taking immodium, pepto, baking soda.   Review of Systems For ROS see HPI     Objective:   Physical Exam  Constitutional: She is oriented to person, place, and time. She appears well-developed and well-nourished. No distress.  HENT:  Head: Normocephalic and atraumatic.       MMM  Neck: Neck supple.  Cardiovascular: Normal rate, regular rhythm and intact distal pulses.   Pulmonary/Chest: Effort normal and breath sounds normal. No respiratory distress. She has no wheezes. She has no rales.  Abdominal: Soft. She exhibits no distension. There is no tenderness. There is no rebound.       Hyperactive BS  Lymphadenopathy:    She has no cervical adenopathy.  Neurological: She is alert and oriented to person, place, and time.  Skin: Skin is warm and dry.          Assessment & Plan:

## 2010-12-08 LAB — HEPATIC FUNCTION PANEL
AST: 28 U/L (ref 0–37)
Albumin: 3.7 g/dL (ref 3.5–5.2)
Alkaline Phosphatase: 95 U/L (ref 39–117)

## 2010-12-08 LAB — CBC WITH DIFFERENTIAL/PLATELET
Basophils Relative: 0.4 % (ref 0.0–3.0)
Eosinophils Absolute: 0.1 10*3/uL (ref 0.0–0.7)
Hemoglobin: 13.4 g/dL (ref 12.0–15.0)
Lymphs Abs: 1.8 10*3/uL (ref 0.7–4.0)
MCHC: 33 g/dL (ref 30.0–36.0)
MCV: 89.8 fl (ref 78.0–100.0)
Monocytes Absolute: 0.8 10*3/uL (ref 0.1–1.0)
Monocytes Relative: 21.7 % — ABNORMAL HIGH (ref 3.0–12.0)
RBC: 4.54 Mil/uL (ref 3.87–5.11)
RDW: 14.4 % (ref 11.5–14.6)

## 2010-12-08 LAB — BASIC METABOLIC PANEL
BUN: 15 mg/dL (ref 6–23)
CO2: 29 mEq/L (ref 19–32)
Calcium: 8.8 mg/dL (ref 8.4–10.5)
Chloride: 105 mEq/L (ref 96–112)
Creatinine, Ser: 0.8 mg/dL (ref 0.4–1.2)
GFR: 91.89 mL/min (ref >60.00)
Potassium: 4 mEq/L (ref 3.5–5.1)
Sodium: 139 mEq/L (ref 135–145)

## 2010-12-12 NOTE — Assessment & Plan Note (Signed)
Check labs to r/o electrolyte abnormality and infxn.  sxs are most likely viral.  Start Lomotil prn.  Reviewed supportive care and red flags that should prompt return.  Pt expressed understanding and is in agreement w/ plan.

## 2010-12-28 ENCOUNTER — Other Ambulatory Visit (HOSPITAL_COMMUNITY): Payer: Self-pay | Admitting: Obstetrics and Gynecology

## 2010-12-28 DIAGNOSIS — Z1231 Encounter for screening mammogram for malignant neoplasm of breast: Secondary | ICD-10-CM

## 2011-01-18 ENCOUNTER — Ambulatory Visit (HOSPITAL_COMMUNITY)
Admission: RE | Admit: 2011-01-18 | Discharge: 2011-01-18 | Disposition: A | Discharge status: Home / Self Care | Payer: Federal, State, Local not specified - PPO | Source: Ambulatory Visit | Attending: Obstetrics and Gynecology | Admitting: Obstetrics and Gynecology

## 2011-01-18 DIAGNOSIS — Z1231 Encounter for screening mammogram for malignant neoplasm of breast: Secondary | ICD-10-CM | POA: Insufficient documentation

## 2011-04-25 ENCOUNTER — Other Ambulatory Visit: Payer: Self-pay | Admitting: Obstetrics and Gynecology

## 2011-06-27 ENCOUNTER — Other Ambulatory Visit: Payer: Self-pay | Admitting: Internal Medicine

## 2011-08-28 ENCOUNTER — Other Ambulatory Visit: Payer: Self-pay | Admitting: Internal Medicine

## 2011-08-29 NOTE — Telephone Encounter (Signed)
Prescription sent to pharmacy.

## 2011-09-26 ENCOUNTER — Ambulatory Visit (INDEPENDENT_AMBULATORY_CARE_PROVIDER_SITE_OTHER): Payer: Federal, State, Local not specified - PPO | Admitting: Internal Medicine

## 2011-09-26 ENCOUNTER — Encounter: Payer: Self-pay | Admitting: Internal Medicine

## 2011-09-26 VITALS — BP 130/92 | HR 63 | Temp 97.7°F | Resp 12 | Ht 64.25 in | Wt 260.0 lb

## 2011-09-26 DIAGNOSIS — Z0000 Encounter for general adult medical examination without abnormal findings: Secondary | ICD-10-CM

## 2011-09-26 DIAGNOSIS — R7309 Other abnormal glucose: Secondary | ICD-10-CM

## 2011-09-26 LAB — CBC WITH DIFFERENTIAL/PLATELET
Eosinophils Absolute: 0.1 10*3/uL (ref 0.0–0.7)
Eosinophils Relative: 1.9 % (ref 0.0–5.0)
HCT: 41.2 % (ref 36.0–46.0)
Hemoglobin: 13.5 g/dL (ref 12.0–15.0)
Lymphocytes Relative: 30.7 % (ref 12.0–46.0)
MCV: 89 fl (ref 78.0–100.0)
Monocytes Relative: 7.2 % (ref 3.0–12.0)
Neutro Abs: 3.9 10*3/uL (ref 1.4–7.7)
Platelets: 233 10*3/uL (ref 150.0–400.0)
RBC: 4.62 Mil/uL (ref 3.87–5.11)
RDW: 14.7 % — ABNORMAL HIGH (ref 11.5–14.6)

## 2011-09-26 LAB — TSH: TSH: 2.02 u[IU]/mL (ref 0.35–5.50)

## 2011-09-26 LAB — BASIC METABOLIC PANEL
Calcium: 9.2 mg/dL (ref 8.4–10.5)
GFR: 80.35 mL/min (ref >60.00)
Glucose, Bld: 82 mg/dL (ref 70–99)

## 2011-09-26 LAB — HEPATIC FUNCTION PANEL
Bilirubin, Direct: 0 mg/dL (ref 0.0–0.3)
Total Bilirubin: 0.5 mg/dL (ref 0.3–1.2)
Total Protein: 7.2 g/dL (ref 6.0–8.3)

## 2011-09-26 LAB — LIPID PANEL
Cholesterol: 162 mg/dL (ref 0–200)
HDL: 46.5 mg/dL (ref >39.00)
Triglycerides: 51 mg/dL (ref 0.0–149.0)

## 2011-09-26 MED ORDER — METOPROLOL SUCCINATE ER 100 MG PO TB24: ORAL_TABLET | ORAL | Status: DC

## 2011-09-26 MED ORDER — AMLODIPINE BESYLATE 5 MG PO TABS: ORAL_TABLET | ORAL | Status: DC

## 2011-09-26 NOTE — Progress Notes (Signed)
  Subjective:    Patient ID: [name], female    DOB: 11-03-1955, 57 y.o.   MRN: [id number]  HPI  She  is here for a physical; she denies acute issues.      Review of Systems HYPERTENSION: Disease Monitoring: Blood pressure range-variable ,  130-135/69-100  Chest pain, palpitations- no       Dyspnea- no Medications: Compliance- yes  Lightheadedness,Syncope- no    Edema- no  DIABETES, BORDERLINE: Disease Monitoring: Blood Sugar ranges-not monitored  Polyuria/phagia/dipsia- frquency      Visual problems- no  ROS:Abd pain, bowel changes- LUQ intermittent discomfort ,worse bending @ worse. "? Pants too tight with weight gain". She denies melena or rectal bleeding. She denies significant dysphagia or dyspepsia. Colonoscopy was negative in 2007   Muscle aches- occasionally in legs in knees & in popliteal spaces @ end of day. No treatment. Occasional muscle cramps ; better with increased water intake.         Objective:   Physical Exam Gen.: well-nourished in appearance. Alert, appropriate and cooperative throughout exam. Head: Normocephalic without obvious abnormalities Eyes: No corneal or conjunctival inflammation noted. Pupils equal round reactive to light and accommodation. Fundal exam is benign without hemorrhages, exudate, papilledema. Extraocular motion intact. Vision grossly normal. Ears: External  ear exam reveals no significant lesions or deformities. Canals clear .TMs normal. Hearing is grossly normal bilaterally. Nose: External nasal exam reveals no deformity or inflammation. Nasal mucosa are pink and moist. No lesions or exudates noted.  Mouth: Oral mucosa and oropharynx reveal no lesions or exudates. Teeth in good repair. Neck: No deformities, masses, or tenderness noted. Range of motion & Thyroid normal Lungs: Normal respiratory effort; chest expands symmetrically. Lungs are clear to auscultation without rales, wheezes, or increased work of breathing. Heart:  Normal rate and rhythm. Normal S1 and S2. No gallop, click, or rub. Grade 1/2 systolic murmur. Abdomen: Bowel sounds normal; abdomen soft and nontender. No masses, organomegaly or hernias noted. Genitalia: Dr Dareen Piano                           Musculoskeletal/extremities: No deformity or scoliosis noted of  the thoracic or lumbar spine. No clubbing, cyanosis, edema, or deformity noted. Range of motion  normal .Tone & strength  normal.Joints normal. Nail health  good. Vascular: Carotid, radial artery, dorsalis pedis and  posterior tibial pulses are full and equal. No bruits present. Neurologic: Alert and oriented x3. Deep tendon reflexes symmetrical and normal.          Skin: Intact without suspicious lesions or rashes. Lymph: No cervical, axillary lymphadenopathy present. Psych: Mood and affect are normal. Normally interactive                                                                                         Assessment & Plan:  #1 comprehensive physical exam; no acute findings #2 see Problem List with Assessments & Recommendations  #3 intermittent left upper quadrant discomfort which appears to be positional. Hiatal hernia suggested  #4 knee pain, probable degenerative joint disease  #5 muscle cramps Plan: see Orders

## 2011-09-26 NOTE — Patient Instructions (Signed)
Preventive Health Care: Exercise  30-45  minutes a day, 3-4 days a week. Walking is especially valuable in preventing Osteoporosis. Eat a low-fat diet with lots of fruits and vegetables, up to 7-9 servings per day. Avoid obesity; your goal = waist less than 35 inches.Consume less than 30 grams of sugar per day from foods & drinks with High Fructose Corn Syrup as # 1,2,3 or #4 on label. Eye Doctor - have an eye exam @ least annually Health Care Power of Attorney & Living Will place you in charge of your health care  decisions. Verify these are  in place. Blood Pressure Goal  Ideally is an AVERAGE < 135/85. This AVERAGE should be calculated from @ least 5-7 BP readings taken @ different times of day on different days of week. You should not respond to isolated BP readings , but rather the AVERAGE for that week.  Please try to go on My Chart within the next 24 hours to allow me to release the results directly to you.

## 2011-10-02 ENCOUNTER — Other Ambulatory Visit (INDEPENDENT_AMBULATORY_CARE_PROVIDER_SITE_OTHER): Payer: Federal, State, Local not specified - PPO

## 2011-10-02 DIAGNOSIS — Z1289 Encounter for screening for malignant neoplasm of other sites: Secondary | ICD-10-CM

## 2012-01-02 ENCOUNTER — Other Ambulatory Visit: Payer: Self-pay | Admitting: Internal Medicine

## 2012-01-02 DIAGNOSIS — Z1231 Encounter for screening mammogram for malignant neoplasm of breast: Secondary | ICD-10-CM

## 2012-01-23 ENCOUNTER — Ambulatory Visit (HOSPITAL_COMMUNITY)
Admission: RE | Admit: 2012-01-23 | Discharge: 2012-01-23 | Disposition: A | Discharge status: Home / Self Care | Payer: Federal, State, Local not specified - PPO | Source: Ambulatory Visit | Attending: Internal Medicine | Admitting: Internal Medicine

## 2012-01-23 DIAGNOSIS — Z1231 Encounter for screening mammogram for malignant neoplasm of breast: Secondary | ICD-10-CM

## 2012-07-15 ENCOUNTER — Ambulatory Visit (INDEPENDENT_AMBULATORY_CARE_PROVIDER_SITE_OTHER): Payer: Federal, State, Local not specified - PPO | Admitting: Family Medicine

## 2012-07-15 VITALS — BP 130/86 | HR 67 | Temp 98.0°F | Resp 17 | Ht 65.5 in | Wt 270.0 lb

## 2012-07-15 DIAGNOSIS — J069 Acute upper respiratory infection, unspecified: Secondary | ICD-10-CM

## 2012-07-15 MED ORDER — GUAIFENESIN-CODEINE 100-10 MG/5ML PO SYRP: 5.0000 mL | ORAL_SOLUTION | Freq: Three times a day (TID) | ORAL | Status: DC | PRN

## 2012-07-15 NOTE — Patient Instructions (Addendum)
Thank you for coming in today. You have a virus causing your cough. You may cough for 2 more weeks.  Call or go to the emergency room if you get worse, have trouble breathing, have chest pains, or palpitations.  Take the cough medicine as needed.   Upper Respiratory Infection, Adult An upper respiratory infection (URI) is also sometimes known as the common cold. The upper respiratory tract includes the nose, sinuses, throat, trachea, and bronchi. Bronchi are the airways leading to the lungs. Most people improve within 1 week, but symptoms can last up to 2 weeks. A residual cough may last even longer.   CAUSES Many different viruses can infect the tissues lining the upper respiratory tract. The tissues become irritated and inflamed and often become very moist. Mucus production is also common. A cold is contagious. You can easily spread the virus to others by oral contact. This includes kissing, sharing a glass, coughing, or sneezing. Touching your mouth or nose and then touching a surface, which is then touched by another person, can also spread the virus. SYMPTOMS   Symptoms typically develop 1 to 3 days after you come in contact with a cold virus. Symptoms vary from person to person. They may include:  Runny nose.   Sneezing.   Nasal congestion.   Sinus irritation.   Sore throat.   Loss of voice (laryngitis).   Cough.   Fatigue.   Muscle aches.   Loss of appetite.   Headache.   Low-grade fever.  DIAGNOSIS   You might diagnose your own cold based on familiar symptoms, since most people get a cold 2 to 3 times a year. Your caregiver can confirm this based on your exam. Most importantly, your caregiver can check that your symptoms are not due to another disease such as strep throat, sinusitis, pneumonia, asthma, or epiglottitis. Blood tests, throat tests, and X-rays are not necessary to diagnose a common cold, but they may sometimes be helpful in excluding other more serious  diseases. Your caregiver will decide if any further tests are required. RISKS AND COMPLICATIONS   You may be at risk for a more severe case of the common cold if you smoke cigarettes, have chronic heart disease (such as heart failure) or lung disease (such as asthma), or if you have a weakened immune system. The very young and very old are also at risk for more serious infections. Bacterial sinusitis, middle ear infections, and bacterial pneumonia can complicate the common cold. The common cold can worsen asthma and chronic obstructive pulmonary disease (COPD). Sometimes, these complications can require emergency medical care and may be life-threatening. PREVENTION   The best way to protect against getting a cold is to practice good hygiene. Avoid oral or hand contact with people with cold symptoms. Wash your hands often if contact occurs. There is no clear evidence that vitamin C, vitamin E, echinacea, or exercise reduces the chance of developing a cold. However, it is always recommended to get plenty of rest and practice good nutrition. TREATMENT   Treatment is directed at relieving symptoms. There is no cure. Antibiotics are not effective, because the infection is caused by a virus, not by bacteria. Treatment may include:  Increased fluid intake. Sports drinks offer valuable electrolytes, sugars, and fluids.   Breathing heated mist or steam (vaporizer or shower).   Eating chicken soup or other clear broths, and maintaining good nutrition.   Getting plenty of rest.   Using gargles or lozenges for comfort.  Controlling fevers with ibuprofen or acetaminophen as directed by your caregiver.   Increasing usage of your inhaler if you have asthma.  Zinc gel and zinc lozenges, taken in the first 24 hours of the common cold, can shorten the duration and lessen the severity of symptoms. Pain medicines may help with fever, muscle aches, and throat pain. A variety of non-prescription medicines are  available to treat congestion and runny nose. Your caregiver can make recommendations and may suggest nasal or lung inhalers for other symptoms.   HOME CARE INSTRUCTIONS    Only take over-the-counter or prescription medicines for pain, discomfort, or fever as directed by your caregiver.   Use a warm mist humidifier or inhale steam from a shower to increase air moisture. This may keep secretions moist and make it easier to breathe.   Drink enough water and fluids to keep your urine clear or pale yellow.   Rest as needed.   Return to work when your temperature has returned to normal or as your caregiver advises. You may need to stay home longer to avoid infecting others. You can also use a face mask and careful hand washing to prevent spread of the virus.  SEEK MEDICAL CARE IF:    After the first few days, you feel you are getting worse rather than better.   You need your caregiver's advice about medicines to control symptoms.   You develop chills, worsening shortness of breath, or brown or red sputum. These may be signs of pneumonia.   You develop yellow or brown nasal discharge or pain in the face, especially when you bend forward. These may be signs of sinusitis.   You develop a fever, swollen neck glands, pain with swallowing, or white areas in the back of your throat. These may be signs of strep throat.  SEEK IMMEDIATE MEDICAL CARE IF:    You have a fever.   You develop severe or persistent headache, ear pain, sinus pain, or chest pain.   You develop wheezing, a prolonged cough, cough up blood, or have a change in your usual mucus (if you have chronic lung disease).   You develop sore muscles or a stiff neck.  Document Released: 12/05/2000 Document Revised: 09/03/2011 Document Reviewed: 10/13/2010 [facility] Patient Information 2013 [place], [place].

## 2012-07-15 NOTE — Progress Notes (Signed)
[name] is a 57 y.o. female who presents to [facility] today for her 6 days. Initially patient had symptoms such as body aches fevers chills and nasal congestion. Does have improved however she has a persistent nonproductive cough. She denies any trouble breathing current fevers chills nausea vomiting or diarrhea. Initially she denies any chest pain or palpitation. She has tried several over-the-counter cold medications which have only been marginally helpful.  She feels well otherwise.   PMH: Reviewed diabetes and hypertension History  Substance Use Topics  . Smoking status: Never Smoker   . Smokeless tobacco: Not on file  . Alcohol Use: No   ROS as above  Medications reviewed. Current Outpatient Prescriptions  Medication Sig Dispense Refill  . amLODipine (NORVASC) 5 MG tablet TAKE ONE TABLET BY MOUTH EVERY DAY  90 tablet  3  . metoprolol succinate (TOPROL-XL) 100 MG 24 hr tablet TAKE ONE TABLET BY MOUTH EVERY DAY  90 tablet  3  . guaiFENesin-codeine (ROBITUSSIN AC) 100-10 MG/5ML syrup Take 5 mLs by mouth 3 (three) times daily as needed for cough.  120 mL  0    Exam:  BP 130/86  Pulse 67  Temp 98 F (36.7 C) (Oral)  Resp 17  Ht 5' 5.5" (1.664 m)  Wt 270 lb (122.471 kg)  BMI 44.25 kg/m2  SpO2 96% Gen: Well NAD HEENT: EOMI,  MMM, mildly erythematous posterior pharynx. Normal tympanic membranes bilaterally. Lungs: CTABL Nl WOB Heart: RRR no MRG Abd: NABS, NT, ND Exts: Non edematous BL  LE, warm and well perfused.   No results found for this or any previous visit (from the past 72 hour(s)).  Assessment and Plan: 57 y.o. female with viral URI with cough. This may be post viral cough additionally. Patient is clinically well with normal oxygen saturation, respirations and lung exam.  I do not feel this patient has pneumonia or bacterial bronchitis. Plan to treat symptomatically with Tylenol, and codeine/guaifenesin cough syrup. Discussed warning signs or symptoms. Please see  discharge instructions. Patient expresses understanding. Followup as needed.

## 2012-09-18 ENCOUNTER — Other Ambulatory Visit: Payer: Self-pay | Admitting: Internal Medicine

## 2012-09-21 ENCOUNTER — Other Ambulatory Visit: Payer: Self-pay | Admitting: Internal Medicine

## 2012-10-01 ENCOUNTER — Encounter: Payer: Self-pay | Admitting: Internal Medicine

## 2012-10-01 ENCOUNTER — Ambulatory Visit (INDEPENDENT_AMBULATORY_CARE_PROVIDER_SITE_OTHER): Payer: Federal, State, Local not specified - PPO | Admitting: Internal Medicine

## 2012-10-01 VITALS — BP 142/86 | HR 75 | Temp 97.5°F | Resp 15 | Ht 65.0 in | Wt 266.0 lb

## 2012-10-01 DIAGNOSIS — R7309 Other abnormal glucose: Secondary | ICD-10-CM

## 2012-10-01 DIAGNOSIS — E559 Vitamin D deficiency, unspecified: Secondary | ICD-10-CM

## 2012-10-01 DIAGNOSIS — Z0000 Encounter for general adult medical examination without abnormal findings: Secondary | ICD-10-CM

## 2012-10-01 DIAGNOSIS — L639 Alopecia areata, unspecified: Secondary | ICD-10-CM

## 2012-10-01 NOTE — Patient Instructions (Addendum)
Preventive Health Care: Exercise  30-45  minutes a day, 3-4 days a week. Walking is especially valuable in preventing Osteoporosis. Eat a low-fat diet with lots of fruits and vegetables, up to 7-9 servings per day. This would eliminate need for vitamin supplements for most individuals. Consume less than 30 grams of sugar per day from foods & drinks with High Fructose Corn Syrup as #2,3 or #4 on label. Minimal Blood Pressure Goal= AVERAGE < 140/90;  Ideal is an AVERAGE < 135/85. This AVERAGE should be calculated from @ least 5-7 BP readings taken @ different times of day on different days of week. You should not respond to isolated BP readings , but rather the AVERAGE for that week .Please bring your  blood pressure cuff to office visits to verify that it is reliable.It  can also be checked against the blood pressure device at the pharmacy. Finger or wrist cuffs are not dependable; an arm cuff is. The legal document "Health Care Power of Attorney & Living Will " verifies decisions concerning your health care. Use T-Gel , a coal tar shampoo, one 2 times per week. This will have an antibacterial effect on scalp lesions. Use Eucerin or Aveeno Daily  Moisturizing Lotion  twice a day  for the dry skin. Bathe with moisturizing liquid soap , not bar soap. Review and correct the record as indicated. Please share record with all medical staff seen.

## 2012-10-01 NOTE — Progress Notes (Signed)
  Subjective:    Patient ID: [name], female    DOB: [birth year], 57 y.o.   MRN: [id number]  HPI  She is here for a physical;acute issues include patchy alopecia since 05/2012     Review of Systems HYPERTENSION: Disease Monitoring: Blood pressure range-not monitored  Chest pain, palpitations- no       Dyspnea- no Medications: Compliance- yes  Lightheadedness,Syncope- no    Edema- no  PMH of HYPERGLYCEMIA: Disease Monitoring: A1c 6.4 % in 2013 Blood Sugar ranges-no monitor  Polyuria/phagia/dipsia- no      Visual problems-no  HYPERLIPIDEMIA: Disease Monitoring: See symptoms for Hypertension Medications: Compliance- no statin  Diet: modified heart healthy Exercise: started walking 09/30/12      Objective:   Physical Exam Gen.: Well-nourished in appearance. Alert, appropriate and cooperative throughout exam.  Head: Normocephalic without obvious abnormalities Eyes: No corneal or conjunctival inflammation noted. Extraocular motion intact. Distant vision grossly normal without lenses Ears: External  ear exam reveals no significant lesions or deformities. Canals clear .TMs normal. Hearing is grossly normal bilaterally. Nose: External nasal exam reveals no deformity or inflammation. Nasal mucosa are pink and moist. No lesions or exudates noted.   Mouth: Oral mucosa and oropharynx reveal no lesions or exudates. Teeth in good repair. Neck: No deformities, masses, or tenderness noted. Range of motion & Thyroid normal. Lungs: Normal respiratory effort; chest expands symmetrically. Lungs are clear to auscultation without rales, wheezes, or increased work of breathing. Heart: Normal rate and rhythm. Normal S1 ; S2 accentuated. No gallop, click, or rub.No definite murmur. Abdomen: Bowel sounds normal; abdomen soft and nontender. No masses, organomegaly or hernias noted. Genitalia: As per Dr Samule Ohm                                  Musculoskeletal/extremities: Accentuated  curvature of upper thoracic spine. No clubbing, cyanosis, edema, or significant extremity  deformity noted. Range of motion normal .Tone & strength  Normal. Joints normal. Nail health good. Able to lie down & sit up w/o help. Negative SLR bilaterally Vascular: Carotid, radial artery, dorsalis pedis and  posterior tibial pulses are full and equal. No bruits present. Neurologic: Alert and oriented x3. Deep tendon reflexes symmetrical ; 1/2+ @ knees.          Skin: Intact without suspicious lesions or rashes.Two areas of alopecia areata. Localized hyperpigmentation L anterior shin changes Lymph: No cervical, axillary lymphadenopathy present. Psych: Mood and affect are normal. Normally interactive                                                                                        Assessment & Plan:  #1 comprehensive physical exam; no acute findings #2 alopecia areata  Plan: see Orders  & Recommendations

## 2012-10-02 LAB — CBC WITH DIFFERENTIAL/PLATELET
Basophils Relative: 1.4 % (ref 0.0–3.0)
Eosinophils Relative: 5.8 % — ABNORMAL HIGH (ref 0.0–5.0)
HCT: 41.1 % (ref 36.0–46.0)
Lymphs Abs: 1 10*3/uL (ref 0.7–4.0)
MCV: 87.1 fl (ref 78.0–100.0)
Monocytes Absolute: 0.6 10*3/uL (ref 0.1–1.0)
Neutrophils Relative %: 71.9 % (ref 43.0–77.0)

## 2012-10-02 LAB — BASIC METABOLIC PANEL
BUN: 15 mg/dL (ref 6–23)
CO2: 27 mEq/L (ref 19–32)
Calcium: 9 mg/dL (ref 8.4–10.5)
Creatinine, Ser: 0.9 mg/dL (ref 0.4–1.2)
GFR: 88.81 mL/min (ref >60.00)
Glucose, Bld: 68 mg/dL — ABNORMAL LOW (ref 70–99)
Sodium: 140 mEq/L (ref 135–145)

## 2012-10-02 LAB — HEPATIC FUNCTION PANEL
ALT: 15 U/L (ref 0–35)
AST: 21 U/L (ref 0–37)
Bilirubin, Direct: 0.1 mg/dL (ref 0.0–0.3)
Total Protein: 7.4 g/dL (ref 6.0–8.3)

## 2012-10-02 LAB — LIPID PANEL
Total CHOL/HDL Ratio: 4
Triglycerides: 69 mg/dL (ref 0.0–149.0)

## 2012-10-06 LAB — VITAMIN D 1,25 DIHYDROXY
Vitamin D 1, 25 (OH)2 Total: 57 pg/mL (ref 18–72)
Vitamin D2 1, 25 (OH)2: <8 pg/mL
Vitamin D3 1, 25 (OH)2: 57 pg/mL

## 2013-01-12 ENCOUNTER — Other Ambulatory Visit: Payer: Self-pay | Admitting: Internal Medicine

## 2013-01-12 DIAGNOSIS — Z1231 Encounter for screening mammogram for malignant neoplasm of breast: Secondary | ICD-10-CM

## 2013-01-28 ENCOUNTER — Ambulatory Visit (HOSPITAL_COMMUNITY)
Admission: RE | Admit: 2013-01-28 | Discharge: 2013-01-28 | Disposition: A | Discharge status: Home / Self Care | Payer: Federal, State, Local not specified - PPO | Source: Ambulatory Visit | Attending: Internal Medicine | Admitting: Internal Medicine

## 2013-01-28 DIAGNOSIS — Z1231 Encounter for screening mammogram for malignant neoplasm of breast: Secondary | ICD-10-CM

## 2013-03-23 ENCOUNTER — Other Ambulatory Visit: Payer: Self-pay | Admitting: Internal Medicine

## 2013-03-23 DIAGNOSIS — I10 Essential (primary) hypertension: Secondary | ICD-10-CM

## 2013-03-23 NOTE — Telephone Encounter (Signed)
Refills sent

## 2013-04-30 ENCOUNTER — Other Ambulatory Visit: Payer: Self-pay

## 2013-05-13 ENCOUNTER — Other Ambulatory Visit: Payer: Self-pay | Admitting: Obstetrics and Gynecology

## 2013-09-24 ENCOUNTER — Ambulatory Visit (INDEPENDENT_AMBULATORY_CARE_PROVIDER_SITE_OTHER): Payer: Federal, State, Local not specified - PPO | Admitting: Internal Medicine

## 2013-09-24 ENCOUNTER — Encounter: Payer: Self-pay | Admitting: Internal Medicine

## 2013-09-24 VITALS — BP 140/100 | HR 57 | Temp 97.9°F | Resp 14 | Ht 65.0 in | Wt 262.8 lb

## 2013-09-24 DIAGNOSIS — Z0000 Encounter for general adult medical examination without abnormal findings: Secondary | ICD-10-CM

## 2013-09-24 DIAGNOSIS — L639 Alopecia areata, unspecified: Secondary | ICD-10-CM | POA: Insufficient documentation

## 2013-09-24 DIAGNOSIS — I10 Essential (primary) hypertension: Secondary | ICD-10-CM

## 2013-09-24 MED ORDER — METOPROLOL SUCCINATE ER 100 MG PO TB24: ORAL_TABLET | ORAL | Status: DC

## 2013-09-24 MED ORDER — AMLODIPINE BESYLATE 5 MG PO TABS: ORAL_TABLET | ORAL | Status: DC

## 2013-09-24 MED ORDER — CLOBETASOL PROPIONATE 0.05 % EX SOLN: 1.0000 "application " | Freq: Two times a day (BID) | CUTANEOUS | Status: DC

## 2013-09-24 NOTE — Patient Instructions (Signed)

## 2013-09-24 NOTE — Progress Notes (Signed)
   Subjective:    Patient ID: [name], female    DOB: 1955/12/22, 58 y.o.   MRN: [id number]  HPI She is here for a physical;acute issues include alopecia. The medication from the Dermatologist helped while on it.     Review of Systems Blood pressure range / average :not monitored Compliant with anti hypertemsive medication. No lightheadedness or other adverse medication effect described.  A low salt diet is followed. No exercise.   Significant headaches, epistaxis, chest pain, palpitations, exertional dyspnea, claudication, paroxysmal nocturnal dyspnea, or edema absent.     Objective:   Physical Exam  Gen.: Healthy and well-nourished in appearance.Weight excess. Alert, appropriate and cooperative throughout exam. Appears younger than stated age  Head: Normocephalic without obvious abnormalities;  patchy alopecia  Eyes: No corneal or conjunctival inflammation noted. Pupils equal round reactive to light and accommodation. Extraocular motion intact.  Ears: External  ear exam reveals no significant lesions or deformities. Wax bilaterally. Hearing is grossly normal bilaterally. Nose: External nasal exam reveals no deformity or inflammation. Nasal mucosa are pink and moist. No lesions or exudates noted.   Mouth: Oral mucosa and oropharynx reveal no lesions or exudates. Teeth in good repair. Neck: No deformities, masses, or tenderness noted. Range of motion & Thyroid normal. Lungs: Normal respiratory effort; chest expands symmetrically. Lungs are clear to auscultation without rales, wheezes, or increased work of breathing. Heart: Normal rate and rhythm. Normal S1 and S2. No gallop, click, or rub.S4 w/o murmur. Abdomen: Bowel sounds normal; abdomen soft and nontender. No masses, organomegaly or hernias noted.                          Musculoskeletal/extremities: No deformity or scoliosis noted of  the thoracic or lumbar spine.  No clubbing, cyanosis, edema, or significant extremity   deformity noted. Range of motion normal .Tone & strength normal. Hand joints normal Crepitus L > R knee  Fingernail health good. Able to lie down & sit up w/o help. Negative SLR bilaterally Vascular: Carotid, radial artery, dorsalis pedis and  posterior tibial pulses are full and equal. No bruits present. Neurologic: Alert and oriented x3. Deep tendon reflexes symmetrical and normal.  Gait normal  . Skin: Intact without suspicious lesions or rashes. Lymph: No cervical, axillary lymphadenopathy present. Psych: Mood and affect are normal. Normally interactive                                                                                      Assessment & Plan:  #1 comprehensive physical exam; no acute findings  Plan: see Orders  & Recommendations

## 2013-09-24 NOTE — Progress Notes (Signed)
Pre visit review using our clinic review tool, if applicable. No additional management support is needed unless otherwise documented below in the visit note. 

## 2013-09-28 ENCOUNTER — Telehealth: Payer: Self-pay | Admitting: Internal Medicine

## 2013-09-28 NOTE — Telephone Encounter (Signed)
Relevant patient education assigned to patient using Emmi. ° °

## 2013-10-05 ENCOUNTER — Other Ambulatory Visit (INDEPENDENT_AMBULATORY_CARE_PROVIDER_SITE_OTHER): Payer: Federal, State, Local not specified - PPO

## 2013-10-05 DIAGNOSIS — Z0000 Encounter for general adult medical examination without abnormal findings: Secondary | ICD-10-CM

## 2013-10-05 LAB — CBC WITH DIFFERENTIAL/PLATELET
Basophils Absolute: 0 10*3/uL (ref 0.0–0.1)
Basophils Relative: 0.4 % (ref 0.0–3.0)
EOS PCT: 6.6 % — AB (ref 0.0–5.0)
Eosinophils Absolute: 0.5 10*3/uL (ref 0.0–0.7)
HCT: 41.3 % (ref 36.0–46.0)
Hemoglobin: 13.7 g/dL (ref 12.0–15.0)
LYMPHS PCT: 27.5 % (ref 12.0–46.0)
Lymphs Abs: 2 10*3/uL (ref 0.7–4.0)
MCHC: 33.2 g/dL (ref 30.0–36.0)
MCV: 87.3 fl (ref 78.0–100.0)
Monocytes Absolute: 0.6 10*3/uL (ref 0.1–1.0)
Monocytes Relative: 7.9 % (ref 3.0–12.0)
NEUTROS ABS: 4.2 10*3/uL (ref 1.4–7.7)
NEUTROS PCT: 57.6 % (ref 43.0–77.0)
Platelets: 222 10*3/uL (ref 150.0–400.0)
RBC: 4.72 Mil/uL (ref 3.87–5.11)
RDW: 14.6 % (ref 11.5–14.6)
WBC: 7.3 10*3/uL (ref 4.5–10.5)

## 2013-10-05 LAB — LIPID PANEL
CHOLESTEROL: 154 mg/dL (ref 0–200)
HDL: 46.7 mg/dL (ref >39.00)
LDL CALC: 101 mg/dL — AB (ref 0–99)
Total CHOL/HDL Ratio: 3
Triglycerides: 34 mg/dL (ref 0.0–149.0)
VLDL: 6.8 mg/dL (ref 0.0–40.0)

## 2013-10-05 LAB — HEPATIC FUNCTION PANEL
ALBUMIN: 3.7 g/dL (ref 3.5–5.2)
ALT: 17 U/L (ref 0–35)
AST: 22 U/L (ref 0–37)
Alkaline Phosphatase: 78 U/L (ref 39–117)
BILIRUBIN DIRECT: 0.1 mg/dL (ref 0.0–0.3)
TOTAL PROTEIN: 7.3 g/dL (ref 6.0–8.3)
Total Bilirubin: 0.4 mg/dL (ref 0.3–1.2)

## 2013-10-05 LAB — BASIC METABOLIC PANEL
BUN: 19 mg/dL (ref 6–23)
CALCIUM: 9.3 mg/dL (ref 8.4–10.5)
CO2: 28 meq/L (ref 19–32)
CREATININE: 0.9 mg/dL (ref 0.4–1.2)
Chloride: 105 mEq/L (ref 96–112)
GFR: 88.49 mL/min (ref >60.00)
GLUCOSE: 97 mg/dL (ref 70–99)
Potassium: 4.4 mEq/L (ref 3.5–5.1)
Sodium: 140 mEq/L (ref 135–145)

## 2013-10-05 LAB — TSH: TSH: 2.6 u[IU]/mL (ref 0.35–5.50)

## 2013-10-05 LAB — HEMOGLOBIN A1C: Hgb A1c MFr Bld: 6.3 % (ref 4.6–6.5)

## 2013-10-11 LAB — VITAMIN D 1,25 DIHYDROXY
Vitamin D 1, 25 (OH)2 Total: 65 pg/mL (ref 18–72)
Vitamin D2 1, 25 (OH)2: <8 pg/mL
Vitamin D3 1, 25 (OH)2: 65 pg/mL

## 2014-01-21 ENCOUNTER — Other Ambulatory Visit (HOSPITAL_COMMUNITY): Payer: Self-pay | Admitting: Obstetrics and Gynecology

## 2014-01-21 DIAGNOSIS — Z1231 Encounter for screening mammogram for malignant neoplasm of breast: Secondary | ICD-10-CM

## 2014-01-27 ENCOUNTER — Ambulatory Visit (HOSPITAL_COMMUNITY): Payer: Federal, State, Local not specified - PPO

## 2014-02-03 ENCOUNTER — Ambulatory Visit (HOSPITAL_COMMUNITY)
Admission: RE | Admit: 2014-02-03 | Discharge: 2014-02-03 | Disposition: A | Discharge status: Home / Self Care | Payer: Federal, State, Local not specified - PPO | Source: Ambulatory Visit | Attending: Obstetrics and Gynecology | Admitting: Obstetrics and Gynecology

## 2014-02-03 DIAGNOSIS — Z1231 Encounter for screening mammogram for malignant neoplasm of breast: Secondary | ICD-10-CM | POA: Diagnosis present

## 2014-08-11 ENCOUNTER — Other Ambulatory Visit: Payer: Self-pay | Admitting: Obstetrics and Gynecology

## 2014-08-12 LAB — CYTOLOGY - PAP

## 2014-09-26 ENCOUNTER — Other Ambulatory Visit: Payer: Self-pay | Admitting: Internal Medicine

## 2014-11-03 ENCOUNTER — Telehealth: Payer: Self-pay | Admitting: Internal Medicine

## 2014-11-03 ENCOUNTER — Encounter: Payer: Self-pay | Admitting: Internal Medicine

## 2014-11-03 ENCOUNTER — Ambulatory Visit (INDEPENDENT_AMBULATORY_CARE_PROVIDER_SITE_OTHER): Payer: Federal, State, Local not specified - PPO | Admitting: Internal Medicine

## 2014-11-03 ENCOUNTER — Other Ambulatory Visit (INDEPENDENT_AMBULATORY_CARE_PROVIDER_SITE_OTHER): Payer: Federal, State, Local not specified - PPO

## 2014-11-03 VITALS — BP 120/90 | HR 67 | Temp 97.8°F | Ht 65.0 in | Wt 255.8 lb

## 2014-11-03 DIAGNOSIS — Z0189 Encounter for other specified special examinations: Secondary | ICD-10-CM | POA: Diagnosis not present

## 2014-11-03 DIAGNOSIS — Z0000 Encounter for general adult medical examination without abnormal findings: Secondary | ICD-10-CM | POA: Diagnosis not present

## 2014-11-03 DIAGNOSIS — I10 Essential (primary) hypertension: Secondary | ICD-10-CM

## 2014-11-03 DIAGNOSIS — E785 Hyperlipidemia, unspecified: Secondary | ICD-10-CM | POA: Diagnosis not present

## 2014-11-03 LAB — CBC WITH DIFFERENTIAL/PLATELET
BASOS ABS: 0 10*3/uL (ref 0.0–0.1)
Basophils Relative: 0.5 % (ref 0.0–3.0)
EOS ABS: 0.4 10*3/uL (ref 0.0–0.7)
Eosinophils Relative: 5 % (ref 0.0–5.0)
HEMATOCRIT: 41.8 % (ref 36.0–46.0)
Hemoglobin: 13.9 g/dL (ref 12.0–15.0)
Lymphocytes Relative: 21.6 % (ref 12.0–46.0)
Lymphs Abs: 1.7 10*3/uL (ref 0.7–4.0)
MCHC: 33.4 g/dL (ref 30.0–36.0)
MCV: 85.5 fl (ref 78.0–100.0)
MONOS PCT: 7.8 % (ref 3.0–12.0)
Monocytes Absolute: 0.6 10*3/uL (ref 0.1–1.0)
Neutro Abs: 5.1 10*3/uL (ref 1.4–7.7)
Neutrophils Relative %: 65.1 % (ref 43.0–77.0)
PLATELETS: 240 10*3/uL (ref 150.0–400.0)
RBC: 4.89 Mil/uL (ref 3.87–5.11)
RDW: 14.5 % (ref 11.5–15.5)
WBC: 7.9 10*3/uL (ref 4.0–10.5)

## 2014-11-03 LAB — LIPID PANEL
Cholesterol: 160 mg/dL (ref 0–200)
HDL: 49.9 mg/dL (ref >39.00)
LDL Cholesterol: 96 mg/dL (ref 0–99)
NONHDL: 110.1
Total CHOL/HDL Ratio: 3
Triglycerides: 71 mg/dL (ref 0.0–149.0)
VLDL: 14.2 mg/dL (ref 0.0–40.0)

## 2014-11-03 LAB — BASIC METABOLIC PANEL
BUN: 19 mg/dL (ref 6–23)
CHLORIDE: 105 meq/L (ref 96–112)
CO2: 28 mEq/L (ref 19–32)
Calcium: 9.7 mg/dL (ref 8.4–10.5)
Creatinine, Ser: 0.89 mg/dL (ref 0.40–1.20)
GFR: 83.6 mL/min (ref >60.00)
Glucose, Bld: 93 mg/dL (ref 70–99)
Potassium: 4 mEq/L (ref 3.5–5.1)
Sodium: 138 mEq/L (ref 135–145)

## 2014-11-03 LAB — HEPATIC FUNCTION PANEL
ALBUMIN: 3.9 g/dL (ref 3.5–5.2)
ALT: 13 U/L (ref 0–35)
AST: 21 U/L (ref 0–37)
Alkaline Phosphatase: 93 U/L (ref 39–117)
BILIRUBIN TOTAL: 0.4 mg/dL (ref 0.2–1.2)
Bilirubin, Direct: 0.1 mg/dL (ref 0.0–0.3)
Total Protein: 7.5 g/dL (ref 6.0–8.3)

## 2014-11-03 LAB — TSH: TSH: 2.64 u[IU]/mL (ref 0.35–4.50)

## 2014-11-03 LAB — HEMOGLOBIN A1C: Hgb A1c MFr Bld: 6.2 % (ref 4.6–6.5)

## 2014-11-03 NOTE — Progress Notes (Signed)
Pre visit review using our clinic review tool, if applicable. No additional management support is needed unless otherwise documented below in the visit note. 

## 2014-11-03 NOTE — Telephone Encounter (Signed)
Pt is having her CPE today and she wanted me to send you a note, in case she forgets to ask you during her visit, who you would recommend her to see for primary care once you retire.  Pt stated she would want to be seen here or in the [facility] office once you leave.  Please advise.

## 2014-11-03 NOTE — Patient Instructions (Signed)
Minimal Blood Pressure Goal= AVERAGE < 140/90;  Ideal is an AVERAGE < 135/85. This AVERAGE should be calculated from @ least 5-7 BP readings taken @ different times of day on different days of week. You should not respond to isolated BP readings , but rather the AVERAGE for that week .Please bring your  blood pressure cuff to office visits to verify that it is reliable.It  can also be checked against the blood pressure device at the pharmacy. Finger or wrist cuffs are not dependable; an arm cuff is.As per the Standard of Care , screening Colonoscopy recommended @ 50 & every 5-10 years thereafter . More frequent monitor would be dictated by family history or findings @ Colonoscopy Your next office appointment will be determined based upon review of your pending labs . Those instructions will be transmitted to you by My Chart Critical results will be called. Followup as needed for any active or acute issue. Please report any significant change in your symptoms.

## 2014-11-03 NOTE — Progress Notes (Signed)
Subjective:    Patient ID: [name], female    DOB: [birth year], 59 y.o.   MRN: [id number]  HPI  She is here for a physical;acute issues denied.  She has been compliant with her medications without adverse effects. She does eat fried foods but denies excess red meat or salt intake. She is not exercising. Blood pressure ranges 115/80-140/100. Other than occasional left lower extremity edema she denies any active cardio pulmonary symptoms.  Her father had a stroke at 41.  Colonoscopy was 2007; she is asymptomatic except for occasional loose stool. There is no family history of colon cancer.  Her A1c was 6.3%, prediabetic. She has burning intermittently along the right lateral thigh but no other symptoms of diabetes.     Review of Systems. Chest pain, palpitations, tachycardia, exertional dyspnea, paroxysmal nocturnal dyspnea, claudication or edema are absent.  Unexplained weight loss, abdominal pain, significant dyspepsia, dysphagia, melena, rectal bleeding, or persistently small caliber stools are denied.  She denies polyuria, polydipsia, polyphagia. She has no nonhealing skin lesions. She denies numbness, tingling, burning in the feet. Weight is stable    Objective:   Physical Exam  Gen.: Adequately nourished in appearance. Alert, appropriate and cooperative throughout exam. BMI: 42.56 Appears younger than stated age  Head: Normocephalic without obvious abnormalities  Eyes: No corneal or conjunctival inflammation noted. Pupils equal round reactive to light and accommodation. Extraocular motion intact.  Ears: External  ear exam reveals no significant lesions or deformities. Canals clear .TMs normal. Hearing is grossly normal bilaterally. Nose: External nasal exam reveals no deformity or inflammation. Nasal mucosa are pink and moist. No lesions or exudates noted.   Mouth: Oral mucosa and oropharynx reveal no lesions or exudates. Teeth in good repair. Neck: No deformities,  masses, or tenderness noted. Range of motion & Thyroid normal. Lungs: Normal respiratory effort; chest expands symmetrically. Lungs are clear to auscultation without rales, wheezes, or increased work of breathing. Heart: Normal rate and rhythm. Normal S1 and S2. No gallop, click, or rub. No murmur. Abdomen: Bowel sounds normal; abdomen soft and nontender. No masses, organomegaly or hernias noted. Genitalia:  as per Gyn                                  Musculoskeletal/extremities: No deformity or scoliosis noted of  the thoracic or lumbar spine. No clubbing, cyanosis, edema, or significant extremity  deformity noted.  Range of motion normal . Tone & strength normal. Hand joints reveal minor  DJD DIP changes.  Fingernail  health good. Crepitus of knees  Able to lie down & sit up w/o help.  Negative SLR bilaterally Vascular: Carotid, radial artery, dorsalis pedis and  posterior tibial pulses are full and equal. No bruits present. Neurologic: Alert and oriented x3. Deep tendon reflexes symmetrical and normal.  Gait normal     Skin: Intact without suspicious lesions or rashes. Lymph: No cervical, axillary lymphadenopathy present. Psych: Mood and affect are normal. Normally interactive  Assessment & Plan:  #1 comprehensive physical exam; no acute findings  Plan: see Orders  & Recommendations

## 2014-11-04 ENCOUNTER — Encounter: Payer: Self-pay | Admitting: Internal Medicine

## 2015-01-26 ENCOUNTER — Other Ambulatory Visit (HOSPITAL_COMMUNITY): Payer: Self-pay | Admitting: Obstetrics and Gynecology

## 2015-01-26 DIAGNOSIS — Z1231 Encounter for screening mammogram for malignant neoplasm of breast: Secondary | ICD-10-CM

## 2015-02-09 ENCOUNTER — Ambulatory Visit (HOSPITAL_COMMUNITY)
Admission: RE | Admit: 2015-02-09 | Discharge: 2015-02-09 | Disposition: A | Discharge status: Home / Self Care | Payer: Federal, State, Local not specified - PPO | Source: Ambulatory Visit | Attending: Obstetrics and Gynecology | Admitting: Obstetrics and Gynecology

## 2015-02-09 DIAGNOSIS — Z1231 Encounter for screening mammogram for malignant neoplasm of breast: Secondary | ICD-10-CM | POA: Insufficient documentation

## 2015-04-01 ENCOUNTER — Other Ambulatory Visit: Payer: Self-pay | Admitting: Internal Medicine

## 2015-08-17 ENCOUNTER — Other Ambulatory Visit: Payer: Self-pay | Admitting: Obstetrics and Gynecology

## 2015-08-18 LAB — CYTOLOGY - PAP

## 2015-09-14 ENCOUNTER — Ambulatory Visit (INDEPENDENT_AMBULATORY_CARE_PROVIDER_SITE_OTHER): Payer: Federal, State, Local not specified - PPO | Admitting: Family Medicine

## 2015-09-14 VITALS — BP 130/80 | HR 71 | Temp 98.2°F | Resp 18 | Ht 65.0 in | Wt 260.0 lb

## 2015-09-14 DIAGNOSIS — K529 Noninfective gastroenteritis and colitis, unspecified: Secondary | ICD-10-CM

## 2015-09-14 MED ORDER — ONDANSETRON 8 MG PO TBDP: 8.0000 mg | ORAL_TABLET | Freq: Three times a day (TID) | ORAL | Status: DC | PRN

## 2015-09-14 NOTE — Addendum Note (Signed)
Addended by: Robyn Haber on: 09/14/2015 12:22 PM   Modules accepted: Level of Service

## 2015-09-14 NOTE — Patient Instructions (Addendum)
Use ice chips at first.  Let me know if you are not improving in 24 hours.    IF you received an x-ray today, you will receive an invoice from [facility]. Please contact [facility] at [phone] with questions or concerns regarding your invoice.   IF you received labwork today, you will receive an invoice from [employer]. Please contact [facility] at [phone] with questions or concerns regarding your invoice.   Our billing staff will not be able to assist you with questions regarding bills from these companies.  You will be contacted with the lab results as soon as they are available. The fastest way to get your results is to activate your My Chart account. Instructions are located on the last page of this paperwork. If you have not heard from [place] regarding the results in 2 weeks, please contact this office.

## 2015-09-14 NOTE — Progress Notes (Signed)
This is a 60 year old who works for the [employer] and developed acute N,V, and diarrhea last night.  No ongoing problems with gi tract No abdominal pains.  Objective:  BP 130/80 mmHg  Pulse 71  Temp(Src) 98.2 F (36.8 C) (Oral)  Resp 18  Ht 5\' 5"  (1.651 m)  Wt 260 lb (117.935 kg)  BMI 43.27 kg/m2  SpO2 97% HEENT:  Unremarkable with moist MM Chest: clear Heart:  Reg, no murmur Abdomen:  Active BS, soft, nontender   Assessment:  Not acutely dehydrated.    ICD-9-CM ICD-10-CM   1. Noninfectious gastroenteritis, unspecified 558.9 K52.9 ondansetron (ZOFRAN-ODT) 8 MG disintegrating tablet     Signed, Robyn Haber, MD

## 2016-01-03 ENCOUNTER — Telehealth: Payer: Self-pay | Admitting: Internal Medicine

## 2016-01-03 DIAGNOSIS — I10 Essential (primary) hypertension: Secondary | ICD-10-CM

## 2016-01-03 DIAGNOSIS — L639 Alopecia areata, unspecified: Secondary | ICD-10-CM

## 2016-01-03 MED ORDER — AMLODIPINE BESYLATE 5 MG PO TABS: ORAL_TABLET | ORAL | Status: DC

## 2016-01-03 MED ORDER — CLOBETASOL PROPIONATE 0.05 % EX SOLN: 1.0000 "application " | Freq: Two times a day (BID) | CUTANEOUS | Status: DC

## 2016-01-03 MED ORDER — METOPROLOL SUCCINATE ER 100 MG PO TB24: 100.0000 mg | ORAL_TABLET | Freq: Every day | ORAL | Status: DC

## 2016-01-03 NOTE — Telephone Encounter (Signed)
Cream sent

## 2016-01-03 NOTE — Telephone Encounter (Signed)
Patient is transferring to [facility] in October.  Please refill amlodipine, metoprolol and cormax to be sent to [facility] on [address].

## 2016-01-03 NOTE — Telephone Encounter (Signed)
Have sent in BP meds, please advise on Cream.

## 2016-01-04 ENCOUNTER — Other Ambulatory Visit: Payer: Self-pay | Admitting: Internal Medicine

## 2016-01-25 ENCOUNTER — Other Ambulatory Visit: Payer: Self-pay | Admitting: Obstetrics and Gynecology

## 2016-01-25 DIAGNOSIS — Z1231 Encounter for screening mammogram for malignant neoplasm of breast: Secondary | ICD-10-CM

## 2016-02-15 ENCOUNTER — Ambulatory Visit
Admission: RE | Admit: 2016-02-15 | Discharge: 2016-02-15 | Disposition: A | Discharge status: Home / Self Care | Payer: Federal, State, Local not specified - PPO | Source: Ambulatory Visit | Attending: Obstetrics and Gynecology | Admitting: Obstetrics and Gynecology

## 2016-02-15 DIAGNOSIS — Z1231 Encounter for screening mammogram for malignant neoplasm of breast: Secondary | ICD-10-CM

## 2016-04-04 ENCOUNTER — Other Ambulatory Visit: Payer: Self-pay | Admitting: Internal Medicine

## 2016-04-04 DIAGNOSIS — I10 Essential (primary) hypertension: Secondary | ICD-10-CM

## 2016-04-11 ENCOUNTER — Other Ambulatory Visit (INDEPENDENT_AMBULATORY_CARE_PROVIDER_SITE_OTHER): Payer: Federal, State, Local not specified - PPO

## 2016-04-11 ENCOUNTER — Ambulatory Visit (INDEPENDENT_AMBULATORY_CARE_PROVIDER_SITE_OTHER): Payer: Federal, State, Local not specified - PPO | Admitting: Internal Medicine

## 2016-04-11 ENCOUNTER — Encounter: Payer: Self-pay | Admitting: Internal Medicine

## 2016-04-11 VITALS — BP 134/80 | HR 68 | Temp 98.0°F | Resp 16 | Ht 65.0 in | Wt 259.0 lb

## 2016-04-11 DIAGNOSIS — I10 Essential (primary) hypertension: Secondary | ICD-10-CM

## 2016-04-11 DIAGNOSIS — Z0000 Encounter for general adult medical examination without abnormal findings: Secondary | ICD-10-CM

## 2016-04-11 DIAGNOSIS — R7303 Prediabetes: Secondary | ICD-10-CM | POA: Diagnosis not present

## 2016-04-11 DIAGNOSIS — M170 Bilateral primary osteoarthritis of knee: Secondary | ICD-10-CM | POA: Diagnosis not present

## 2016-04-11 DIAGNOSIS — Z114 Encounter for screening for human immunodeficiency virus [HIV]: Secondary | ICD-10-CM

## 2016-04-11 DIAGNOSIS — Z1159 Encounter for screening for other viral diseases: Secondary | ICD-10-CM | POA: Diagnosis not present

## 2016-04-11 DIAGNOSIS — Z1211 Encounter for screening for malignant neoplasm of colon: Secondary | ICD-10-CM

## 2016-04-11 LAB — LIPID PANEL
CHOL/HDL RATIO: 4
Cholesterol: 157 mg/dL (ref 0–200)
HDL: 43.6 mg/dL (ref >39.00)
LDL Cholesterol: 99 mg/dL (ref 0–99)
NONHDL: 113.06
Triglycerides: 68 mg/dL (ref 0.0–149.0)
VLDL: 13.6 mg/dL (ref 0.0–40.0)

## 2016-04-11 LAB — COMPREHENSIVE METABOLIC PANEL
ALK PHOS: 85 U/L (ref 39–117)
ALT: 11 U/L (ref 0–35)
AST: 15 U/L (ref 0–37)
Albumin: 3.8 g/dL (ref 3.5–5.2)
BILIRUBIN TOTAL: 0.4 mg/dL (ref 0.2–1.2)
BUN: 19 mg/dL (ref 6–23)
CO2: 28 mEq/L (ref 19–32)
CREATININE: 0.93 mg/dL (ref 0.40–1.20)
Calcium: 9.4 mg/dL (ref 8.4–10.5)
Chloride: 107 mEq/L (ref 96–112)
GFR: 79.08 mL/min (ref >60.00)
GLUCOSE: 104 mg/dL — AB (ref 70–99)
POTASSIUM: 3.8 meq/L (ref 3.5–5.1)
SODIUM: 140 meq/L (ref 135–145)
TOTAL PROTEIN: 7.1 g/dL (ref 6.0–8.3)

## 2016-04-11 LAB — CBC WITH DIFFERENTIAL/PLATELET
BASOS ABS: 0 10*3/uL (ref 0.0–0.1)
Basophils Relative: 0.5 % (ref 0.0–3.0)
EOS ABS: 0.3 10*3/uL (ref 0.0–0.7)
Eosinophils Relative: 4.2 % (ref 0.0–5.0)
HCT: 41.1 % (ref 36.0–46.0)
HEMOGLOBIN: 13.6 g/dL (ref 12.0–15.0)
LYMPHS ABS: 1.7 10*3/uL (ref 0.7–4.0)
Lymphocytes Relative: 21.9 % (ref 12.0–46.0)
MCHC: 33.2 g/dL (ref 30.0–36.0)
MCV: 85.9 fl (ref 78.0–100.0)
MONO ABS: 0.6 10*3/uL (ref 0.1–1.0)
Monocytes Relative: 7.1 % (ref 3.0–12.0)
NEUTROS PCT: 66.3 % (ref 43.0–77.0)
Neutro Abs: 5.3 10*3/uL (ref 1.4–7.7)
Platelets: 241 10*3/uL (ref 150.0–400.0)
RBC: 4.78 Mil/uL (ref 3.87–5.11)
RDW: 14.3 % (ref 11.5–15.5)
WBC: 7.9 10*3/uL (ref 4.0–10.5)

## 2016-04-11 LAB — TSH: TSH: 1.96 u[IU]/mL (ref 0.35–4.50)

## 2016-04-11 LAB — HIV ANTIBODY (ROUTINE TESTING W REFLEX): HIV: NONREACTIVE

## 2016-04-11 LAB — HEPATITIS C ANTIBODY: HCV Ab: NEGATIVE

## 2016-04-11 LAB — HEMOGLOBIN A1C: Hgb A1c MFr Bld: 6.2 % (ref 4.6–6.5)

## 2016-04-11 NOTE — Progress Notes (Signed)
Pre visit review using our clinic review tool, if applicable. No additional management support is needed unless otherwise documented below in the visit note. 

## 2016-04-11 NOTE — Assessment & Plan Note (Signed)
Has seen ortho Pain is intermittent

## 2016-04-11 NOTE — Patient Instructions (Signed)
Test(s) ordered today. Your results will be released to [place] (or called to you) after review, usually within 72hours after test completion. If any changes need to be made, you will be notified at that same time.  All other Health Maintenance issues reviewed.   All recommended immunizations and age-appropriate screenings are up-to-date or discussed.  No immunizations administered today.   Medications reviewed and updated.  No changes recommended at this time.   A referral was ordered for GI for a colonoscopy  Please followup in one year  Health Maintenance, Female Adopting a healthy lifestyle and getting preventive care can go a long way to promote health and wellness. Talk with your health care provider about what schedule of regular examinations is right for you. This is a good chance for you to check in with your provider about disease prevention and staying healthy. In between checkups, there are plenty of things you can do on your own. Experts have done a lot of research about which lifestyle changes and preventive measures are most likely to keep you healthy. Ask your health care provider for more information. WEIGHT AND DIET  Eat a healthy diet  Be sure to include plenty of vegetables, fruits, low-fat dairy products, and lean protein.  Do not eat a lot of foods high in solid fats, added sugars, or salt.  Get regular exercise. This is one of the most important things you can do for your health.  Most adults should exercise for at least 150 minutes each week. The exercise should increase your heart rate and make you sweat (moderate-intensity exercise).  Most adults should also do strengthening exercises at least twice a week. This is in addition to the moderate-intensity exercise.  Maintain a healthy weight  Body mass index (BMI) is a measurement that can be used to identify possible weight problems. It estimates body fat based on height and weight. Your health care provider  can help determine your BMI and help you achieve or maintain a healthy weight.  For females 74 years of age and older:   A BMI below 18.5 is considered underweight.  A BMI of 18.5 to 24.9 is normal.  A BMI of 25 to 29.9 is considered overweight.  A BMI of 30 and above is considered obese.  Watch levels of cholesterol and blood lipids  You should start having your blood tested for lipids and cholesterol at 60 years of age, then have this test every 5 years.  You may need to have your cholesterol levels checked more often if:  Your lipid or cholesterol levels are high.  You are older than 60 years of age.  You are at high risk for heart disease.  CANCER SCREENING   Lung Cancer  Lung cancer screening is recommended for adults 37-25 years old who are at high risk for lung cancer because of a history of smoking.  A yearly low-dose CT scan of the lungs is recommended for people who:  Currently smoke.  Have quit within the past 15 years.  Have at least a 30-pack-year history of smoking. A pack year is smoking an average of one pack of cigarettes a day for 1 year.  Yearly screening should continue until it has been 15 years since you quit.  Yearly screening should stop if you develop a health problem that would prevent you from having lung cancer treatment.  Breast Cancer  Practice breast self-awareness. This means understanding how your breasts normally appear and feel.  It also means  doing regular breast self-exams. Let your health care provider know about any changes, no matter how small.  If you are in your 20s or 30s, you should have a clinical breast exam (CBE) by a health care provider every 1-3 years as part of a regular health exam.  If you are 95 or older, have a CBE every year. Also consider having a breast X-ray (mammogram) every year.  If you have a family history of breast cancer, talk to your health care provider about genetic screening.  If you are at  high risk for breast cancer, talk to your health care provider about having an MRI and a mammogram every year.  Breast cancer gene (BRCA) assessment is recommended for women who have family members with BRCA-related cancers. BRCA-related cancers include:  Breast.  Ovarian.  Tubal.  Peritoneal cancers.  Results of the assessment will determine the need for genetic counseling and BRCA1 and BRCA2 testing. Cervical Cancer Your health care provider may recommend that you be screened regularly for cancer of the pelvic organs (ovaries, uterus, and vagina). This screening involves a pelvic examination, including checking for microscopic changes to the surface of your cervix (Pap test). You may be encouraged to have this screening done every 3 years, beginning at age 25.  For women ages 39-65, health care providers may recommend pelvic exams and Pap testing every 3 years, or they may recommend the Pap and pelvic exam, combined with testing for human papilloma virus (HPV), every 5 years. Some types of HPV increase your risk of cervical cancer. Testing for HPV may also be done on women of any age with unclear Pap test results.  Other health care providers may not recommend any screening for nonpregnant women who are considered low risk for pelvic cancer and who do not have symptoms. Ask your health care provider if a screening pelvic exam is right for you.  If you have had past treatment for cervical cancer or a condition that could lead to cancer, you need Pap tests and screening for cancer for at least 20 years after your treatment. If Pap tests have been discontinued, your risk factors (such as having a new sexual partner) need to be reassessed to determine if screening should resume. Some women have medical problems that increase the chance of getting cervical cancer. In these cases, your health care provider may recommend more frequent screening and Pap tests. Colorectal Cancer  This type of cancer  can be detected and often prevented.  Routine colorectal cancer screening usually begins at 60 years of age and continues through 60 years of age.  Your health care provider may recommend screening at an earlier age if you have risk factors for colon cancer.  Your health care provider may also recommend using home test kits to check for hidden blood in the stool.  A small camera at the end of a tube can be used to examine your colon directly (sigmoidoscopy or colonoscopy). This is done to check for the earliest forms of colorectal cancer.  Routine screening usually begins at age 59.  Direct examination of the colon should be repeated every 5-10 years through 60 years of age. However, you may need to be screened more often if early forms of precancerous polyps or small growths are found. Skin Cancer  Check your skin from head to toe regularly.  Tell your health care provider about any new moles or changes in moles, especially if there is a change in a mole's shape or  color.  Also tell your health care provider if you have a mole that is larger than the size of a pencil eraser.  Always use sunscreen. Apply sunscreen liberally and repeatedly throughout the day.  Protect yourself by wearing long sleeves, pants, a wide-brimmed hat, and sunglasses whenever you are outside. HEART DISEASE, DIABETES, AND HIGH BLOOD PRESSURE   High blood pressure causes heart disease and increases the risk of stroke. High blood pressure is more likely to develop in:  People who have blood pressure in the high end of the normal range (130-139/85-89 mm Hg).  People who are overweight or obese.  People who are African American.  If you are 59-21 years of age, have your blood pressure checked every 3-5 years. If you are 109 years of age or older, have your blood pressure checked every year. You should have your blood pressure measured twice--once when you are at a hospital or clinic, and once when you are not at a  hospital or clinic. Record the average of the two measurements. To check your blood pressure when you are not at a hospital or clinic, you can use:  An automated blood pressure machine at a pharmacy.  A home blood pressure monitor.  If you are between 15 years and 70 years old, ask your health care provider if you should take aspirin to prevent strokes.  Have regular diabetes screenings. This involves taking a blood sample to check your fasting blood sugar level.  If you are at a normal weight and have a low risk for diabetes, have this test once every three years after 60 years of age.  If you are overweight and have a high risk for diabetes, consider being tested at a younger age or more often. PREVENTING INFECTION  Hepatitis B  If you have a higher risk for hepatitis B, you should be screened for this virus. You are considered at high risk for hepatitis B if:  You were born in a country where hepatitis B is common. Ask your health care provider which countries are considered high risk.  Your parents were born in a high-risk country, and you have not been immunized against hepatitis B (hepatitis B vaccine).  You have HIV or AIDS.  You use needles to inject street drugs.  You live with someone who has hepatitis B.  You have had sex with someone who has hepatitis B.  You get hemodialysis treatment.  You take certain medicines for conditions, including cancer, organ transplantation, and autoimmune conditions. Hepatitis C  Blood testing is recommended for:  Everyone born from [birth year] through 1965.  Anyone with known risk factors for hepatitis C. Sexually transmitted infections (STIs)  You should be screened for sexually transmitted infections (STIs) including gonorrhea and chlamydia if:  You are sexually active and are younger than 60 years of age.  You are older than 60 years of age and your health care provider tells you that you are at risk for this type of  infection.  Your sexual activity has changed since you were last screened and you are at an increased risk for chlamydia or gonorrhea. Ask your health care provider if you are at risk.  If you do not have HIV, but are at risk, it may be recommended that you take a prescription medicine daily to prevent HIV infection. This is called pre-exposure prophylaxis (PrEP). You are considered at risk if:  You are sexually active and do not regularly use condoms or know the HIV status  of your partner(s).  You take drugs by injection.  You are sexually active with a partner who has HIV. Talk with your health care provider about whether you are at high risk of being infected with HIV. If you choose to begin PrEP, you should first be tested for HIV. You should then be tested every 3 months for as long as you are taking PrEP.  PREGNANCY   If you are premenopausal and you may become pregnant, ask your health care provider about preconception counseling.  If you may become pregnant, take 400 to 800 micrograms (mcg) of folic acid every day.  If you want to prevent pregnancy, talk to your health care provider about birth control (contraception). OSTEOPOROSIS AND MENOPAUSE   Osteoporosis is a disease in which the bones lose minerals and strength with aging. This can result in serious bone fractures. Your risk for osteoporosis can be identified using a bone density scan.  If you are 24 years of age or older, or if you are at risk for osteoporosis and fractures, ask your health care provider if you should be screened.  Ask your health care provider whether you should take a calcium or vitamin D supplement to lower your risk for osteoporosis.  Menopause may have certain physical symptoms and risks.  Hormone replacement therapy may reduce some of these symptoms and risks. Talk to your health care provider about whether hormone replacement therapy is right for you.  HOME CARE INSTRUCTIONS   Schedule regular  health, dental, and eye exams.  Stay current with your immunizations.   Do not use any tobacco products including cigarettes, chewing tobacco, or electronic cigarettes.  If you are pregnant, do not drink alcohol.  If you are breastfeeding, limit how much and how often you drink alcohol.  Limit alcohol intake to no more than 1 drink per day for nonpregnant women. One drink equals 12 ounces of beer, 5 ounces of wine, or 1 ounces of hard liquor.  Do not use street drugs.  Do not share needles.  Ask your health care provider for help if you need support or information about quitting drugs.  Tell your health care provider if you often feel depressed.  Tell your health care provider if you have ever been abused or do not feel safe at home.   This information is not intended to replace advice given to you by your health care provider. Make sure you discuss any questions you have with your health care provider.   Document Released: 12/25/2010 Document Revised: 07/02/2014 Document Reviewed: 05/13/2013 Elsevier Interactive Patient Education [employer].

## 2016-04-11 NOTE — Assessment & Plan Note (Signed)
Check a1c Stressed exercise and weight loss

## 2016-04-11 NOTE — Progress Notes (Signed)
Subjective:    Patient ID: [name], female    DOB: 1956-06-11, 60 y.o.   MRN: [id number]  HPI   She is here to establish with a new pcp.   She is here for a physical exam.     Medications and allergies reviewed with patient and updated if appropriate.  Patient Active Problem List   Diagnosis Date Noted  . Alopecia areata 09/24/2013  . Unspecified vitamin D deficiency 10/01/2012  . Pre-diabetes 03/24/2009  . HYPERLIPIDEMIA 02/26/2008  . Essential hypertension 02/26/2008    Current Outpatient Prescriptions on File Prior to Visit  Medication Sig Dispense Refill  . amLODipine (NORVASC) 5 MG tablet TAKE ONE TABLET BY MOUTH ONCE DAILY 90 tablet 0  . aspirin 81 MG tablet Take 81 mg by mouth daily.    . Biotin 5000 MCG CAPS Take by mouth daily.    . Cholecalciferol (VITAMIN D-3) 1000 UNITS CAPS Take 1 capsule by mouth daily.    . clobetasol (TEMOVATE) 0.05 % external solution Apply 1 application topically 2 (two) times daily. 50 mL 0  . metoprolol succinate (TOPROL-XL) 100 MG 24 hr tablet Take 1 tablet (100 mg total) by mouth daily. Must keep 04/11/16 appt w/new pcp for future refills 90 tablet 0   No current facility-administered medications on file prior to visit.     Past Medical History:  Diagnosis Date  . Herpes zoster 1992   posterior thorax  . Hyperlipidemia 2006    LDL 104, HDL 50  . Hypertension   . Other abnormal glucose     Past Surgical History:  Procedure Laterality Date  . COLONOSCOPY  2007   negative  . CYSTECTOMY     peri rectal  . FOOT SURGERY     bilaterally  . INCONTINENCE SURGERY  2005   Dr Quincy Simmonds  . LIPOMA EXCISION  2011    L thorax , Dr Georgette Dover    Social History   Social History  . Marital status: Single    Spouse name: N/A  . Number of children: N/A  . Years of education: N/A   Social History Main Topics  . Smoking status: Former Smoker    Quit date: 06/25/1980  . Smokeless tobacco: Never Used     Comment: smoked [age] , up to  1/2 ppd  . Alcohol use Yes     Comment:  very rarely  . Drug use: No  . Sexual activity: Yes    Birth control/ protection: None   Other Topics Concern  . None   Social History Narrative   No regular exercise    Family History  Problem Relation Age of Onset  . Diabetes Mother   . Stroke Father 75  . Hypertension Father   . Hypertension Sister   . Hypertension Maternal Aunt   . Diabetes Maternal Aunt   . Cancer Neg Hx     Review of Systems  Constitutional: Negative for appetite change, chills, fatigue, fever and unexpected weight change.  HENT: Negative for hearing loss.   Eyes: Negative for visual disturbance.  Respiratory: Negative for cough, shortness of breath and wheezing.   Cardiovascular: Negative for chest pain, palpitations and leg swelling.  Gastrointestinal: Positive for diarrhea (frequent, better than in the past). Negative for abdominal pain, blood in stool, constipation and nausea.       No gerd  Endocrine: Negative for polydipsia and polyuria.  Genitourinary: Positive for frequency. Negative for dysuria and hematuria.  Musculoskeletal: Positive for arthralgias (left  shoulder pain, b/l knees). Negative for back pain.  Skin: Negative for color change and rash.  Neurological: Negative for dizziness, light-headedness and headaches.  Psychiatric/Behavioral: Negative for dysphoric mood. The patient is not nervous/anxious.        Objective:   Vitals:   04/11/16 1401  BP: 134/80  Pulse: 68  Resp: 16  Temp: 98 F (36.7 C)   Filed Weights   04/11/16 1401  Weight: 259 lb (117.5 kg)   Body mass index is 43.1 kg/m.   Physical Exam Constitutional: She appears well-developed and well-nourished. No distress.  HENT:  Head: Normocephalic and atraumatic.  Right Ear: External ear normal. Normal ear canal and TM Left Ear: External ear normal.  Normal ear canal and TM Mouth/Throat: Oropharynx is clear and moist.  Eyes: Conjunctivae and EOM are normal.  Neck:  Neck supple. No tracheal deviation present. No thyromegaly present.  No carotid bruit  Cardiovascular: Normal rate, regular rhythm and normal heart sounds.   No murmur heard.  No edema. Pulmonary/Chest: Effort normal and breath sounds normal. No respiratory distress. She has no wheezes. She has no rales.  Breast: deferred to Gyn Abdominal: Soft. She exhibits no distension. There is no tenderness.  Lymphadenopathy: She has no cervical adenopathy.  Skin: Skin is warm and dry. She is not diaphoretic. skin tags on abd and back appear benign Psychiatric: She has a normal mood and affect. Her behavior is normal.         Assessment & Plan:   Physical exam: Screening blood work ordered Immunizations -  Deferred shingles and flu vaccine Colonoscopy - due - referred Mammogram  Up to date  Gyn  Up to date Eye exams  Up to date  Exercise - none - stressed regular exercise Weight - morbidly obese - encouraged weight loss Skin  - no concerning skin findings Substance abuse - none  See Problem List for Assessment and Plan of chronic medical problems.

## 2016-04-11 NOTE — Assessment & Plan Note (Signed)
BP well controlled Current regimen effective and well tolerated Continue current medications at current doses  

## 2016-04-12 ENCOUNTER — Encounter: Payer: Self-pay | Admitting: Internal Medicine

## 2016-05-25 ENCOUNTER — Encounter: Payer: Self-pay | Admitting: Internal Medicine

## 2016-06-01 ENCOUNTER — Encounter: Payer: Self-pay | Admitting: Gastroenterology

## 2016-07-06 ENCOUNTER — Other Ambulatory Visit: Payer: Self-pay | Admitting: Internal Medicine

## 2016-07-06 DIAGNOSIS — I10 Essential (primary) hypertension: Secondary | ICD-10-CM

## 2016-07-18 ENCOUNTER — Ambulatory Visit (AMBULATORY_SURGERY_CENTER): Payer: Self-pay | Admitting: *Deleted

## 2016-07-18 VITALS — Ht 64.0 in | Wt 254.8 lb

## 2016-07-18 DIAGNOSIS — Z1211 Encounter for screening for malignant neoplasm of colon: Secondary | ICD-10-CM

## 2016-07-18 MED ORDER — NA SULFATE-K SULFATE-MG SULF 17.5-3.13-1.6 GM/177ML PO SOLN: 1.0000 | Freq: Once | ORAL | 0 refills | Status: AC

## 2016-07-18 NOTE — Progress Notes (Signed)
Denies allergies to eggs or soy products. Denies complications with sedation or anesthesia. Denies O2 use. Denies use of diet or weight loss medications.  Emmi instructions declined for colonoscopy.  

## 2016-07-25 ENCOUNTER — Ambulatory Visit (AMBULATORY_SURGERY_CENTER): Payer: Federal, State, Local not specified - PPO | Admitting: Gastroenterology

## 2016-07-25 ENCOUNTER — Encounter: Payer: Self-pay | Admitting: Gastroenterology

## 2016-07-25 VITALS — BP 149/86 | HR 69 | Temp 96.8°F | Resp 11 | Ht 64.0 in | Wt 254.0 lb

## 2016-07-25 DIAGNOSIS — Z1212 Encounter for screening for malignant neoplasm of rectum: Secondary | ICD-10-CM | POA: Diagnosis not present

## 2016-07-25 DIAGNOSIS — K635 Polyp of colon: Secondary | ICD-10-CM | POA: Diagnosis not present

## 2016-07-25 DIAGNOSIS — Z1211 Encounter for screening for malignant neoplasm of colon: Secondary | ICD-10-CM | POA: Diagnosis not present

## 2016-07-25 DIAGNOSIS — D123 Benign neoplasm of transverse colon: Secondary | ICD-10-CM

## 2016-07-25 MED ORDER — SODIUM CHLORIDE 0.9 % IV SOLN: 500.0000 mL | INTRAVENOUS | Status: DC

## 2016-07-25 NOTE — Op Note (Signed)
[facility] Patient Name: [name] Procedure Date: 07/25/2016 10:25 AM MRN: [id number] Endoscopist: Remo Lipps P. Armbruster MD, MD Age: 61 Referring MD:  Date of Birth: 11-Mar-1956 Gender: Female Account #: [id number] Procedure:                Colonoscopy Indications:              Screening for colorectal malignant neoplasm Medicines:                Monitored Anesthesia Care Procedure:                Pre-Anesthesia Assessment:                           - Prior to the procedure, a History and Physical                            was performed, and patient medications and                            allergies were reviewed. The patient's tolerance of                            previous anesthesia was also reviewed. The risks                            and benefits of the procedure and the sedation                            options and risks were discussed with the patient.                            All questions were answered, and informed consent                            was obtained. Prior Anticoagulants: The patient has                            taken aspirin, last dose was 1 day prior to                            procedure. ASA Grade Assessment: II - A patient                            with mild systemic disease. After reviewing the                            risks and benefits, the patient was deemed in                            satisfactory condition to undergo the procedure.                           After obtaining informed consent, the colonoscope  was passed under direct vision. Throughout the                            procedure, the patient's blood pressure, pulse, and                            oxygen saturations were monitored continuously. The                            Model CF-HQ190L [id number]) scope was introduced                            through the anus and advanced to the the cecum,   identified by appendiceal orifice and ileocecal                            valve. The colonoscopy was performed without                            difficulty. The patient tolerated the procedure                            well. The quality of the bowel preparation was                            good. The ileocecal valve, appendiceal orifice, and                            rectum were photographed. Scope In: 10:44:48 AM Scope Out: [id number] AM Scope Withdrawal Time: 0 hours 13 minutes 6 seconds  Total Procedure Duration: 0 hours 16 minutes 30 seconds  Findings:                 The perianal and digital rectal examinations were                            normal.                           A 4 mm polyp was found in the hepatic flexure. The                            polyp was sessile. The polyp was removed with a                            cold snare. Resection and retrieval were complete.                           A few medium-mouthed diverticula were found in the                            sigmoid colon.                           The exam was otherwise without abnormality. Complications:  No immediate complications. Estimated blood loss:                            Minimal. Estimated Blood Loss:     Estimated blood loss was minimal. Impression:               - One 4 mm polyp at the hepatic flexure, removed                            with a cold snare. Resected and retrieved.                           - Diverticulosis in the sigmoid colon.                           - The examination was otherwise normal. Recommendation:           - Patient has a contact number available for                            emergencies. The signs and symptoms of potential                            delayed complications were discussed with the                            patient. Return to normal activities tomorrow.                            Written discharge instructions were provided to the                             patient.                           - Resume previous diet.                           - Continue present medications.                           - No ibuprofen, naproxen, or other non-steroidal                            anti-inflammatory drugs for 2 weeks after polyp                            removal.                           - Await pathology results.                           - Repeat colonoscopy is recommended for                            surveillance. The colonoscopy date will  be                            determined after pathology results from today's                            exam become available for review. Remo Lipps P. Armbruster MD, MD 07/25/2016 11:06:16 AM This report has been signed electronically.

## 2016-07-25 NOTE — Progress Notes (Signed)
Report to PACU, RN, vss, BBS= Clear.  

## 2016-07-25 NOTE — Patient Instructions (Signed)
YOU HAD AN ENDOSCOPIC PROCEDURE TODAY AT THE [facility]:   Refer to the procedure report that was given to you for any specific questions about what was found during the examination.  If the procedure report does not answer your questions, please call your gastroenterologist to clarify.  If you requested that your care partner not be given the details of your procedure findings, then the procedure report has been included in a sealed envelope for you to review at your convenience later.  YOU SHOULD EXPECT: Some feelings of bloating in the abdomen. Passage of more gas than usual.  Walking can help get rid of the air that was put into your GI tract during the procedure and reduce the bloating. If you had a lower endoscopy (such as a colonoscopy or flexible sigmoidoscopy) you may notice spotting of blood in your stool or on the toilet paper. If you underwent a bowel prep for your procedure, you may not have a normal bowel movement for a few days.  Please Note:  You might notice some irritation and congestion in your nose or some drainage.  This is from the oxygen used during your procedure.  There is no need for concern and it should clear up in a day or so.  SYMPTOMS TO REPORT IMMEDIATELY:   Following lower endoscopy (colonoscopy or flexible sigmoidoscopy):  Excessive amounts of blood in the stool  Significant tenderness or worsening of abdominal pains  Swelling of the abdomen that is new, acute  Fever of 100F or higher  For urgent or emergent issues, a gastroenterologist can be reached at any hour by calling [phone].   DIET:  We do recommend a small meal at first, but then you may proceed to your regular diet.  Drink plenty of fluids but you should avoid alcoholic beverages for 24 hours.  ACTIVITY:  You should plan to take it easy for the rest of today and you should NOT DRIVE or use heavy machinery until tomorrow (because of the sedation medicines used during the test).     FOLLOW UP: Our staff will call the number listed on your records the next business day following your procedure to check on you and address any questions or concerns that you may have regarding the information given to you following your procedure. If we do not reach you, we will leave a message.  However, if you are feeling well and you are not experiencing any problems, there is no need to return our call.  We will assume that you have returned to your regular daily activities without incident.  If any biopsies were taken you will be contacted by phone or by letter within the next 1-3 weeks.  Please call us at [phone] if you have not heard about the biopsies in 3 weeks.    SIGNATURES/CONFIDENTIALITY: You and/or your care partner have signed paperwork which will be entered into your electronic medical record.  These signatures attest to the fact that that the information above on your After Visit Summary has been reviewed and is understood.  Full responsibility of the confidentiality of this discharge information lies with you and/or your care-partner.  Polyp,diverticulosis, and high fiber diet information given.  

## 2016-07-25 NOTE — Progress Notes (Signed)
Called to room to assist during endoscopic procedure.  Patient ID and intended procedure confirmed with present staff. Received instructions for my participation in the procedure from the performing physician.  

## 2016-07-26 ENCOUNTER — Telehealth: Payer: Self-pay

## 2016-07-26 ENCOUNTER — Telehealth: Payer: Self-pay | Admitting: *Deleted

## 2016-07-26 NOTE — Telephone Encounter (Signed)
  Follow up Call-  Call back number 07/25/2016  Post procedure Call Back phone  # [phone]  Permission to leave phone message Yes  Some recent data might be hidden    Patient was called for follow up after her procedure on 07/25/2016. No answer at the number given for follow up phone call. A message was left on the answering machine.

## 2016-07-26 NOTE — Telephone Encounter (Signed)
Message left

## 2016-07-30 ENCOUNTER — Encounter: Payer: Self-pay | Admitting: Gastroenterology

## 2016-08-29 ENCOUNTER — Other Ambulatory Visit: Payer: Self-pay | Admitting: Obstetrics and Gynecology

## 2016-08-29 ENCOUNTER — Telehealth: Payer: Self-pay | Admitting: Gastroenterology

## 2016-08-29 DIAGNOSIS — Z6841 Body Mass Index (BMI) 40.0 and over, adult: Secondary | ICD-10-CM | POA: Diagnosis not present

## 2016-08-29 DIAGNOSIS — Z01419 Encounter for gynecological examination (general) (routine) without abnormal findings: Secondary | ICD-10-CM | POA: Diagnosis not present

## 2016-08-29 DIAGNOSIS — Z124 Encounter for screening for malignant neoplasm of cervix: Secondary | ICD-10-CM | POA: Diagnosis not present

## 2016-08-31 LAB — CYTOLOGY - PAP

## 2016-10-11 ENCOUNTER — Telehealth: Payer: Self-pay | Admitting: Internal Medicine

## 2016-10-11 NOTE — Telephone Encounter (Signed)
Pt called stating she received a bill in the mail  for a $50 no show fee, there are no, no shows in her past. States this has been going on since January

## 2016-10-26 NOTE — Telephone Encounter (Signed)
No show fee is from 07/18/16 with Dr. Havery Moros (GI?). Sent MyChart msg to patient informing her to contact that office for further assistance.

## 2016-11-16 NOTE — Telephone Encounter (Signed)
I reached out to [facility] and had that No Show Fee removed. It was an error. I  called [name] to  Apologize. No answer left a voicemail to return my call.

## 2017-01-09 ENCOUNTER — Other Ambulatory Visit: Payer: Self-pay | Admitting: Internal Medicine

## 2017-01-09 DIAGNOSIS — I10 Essential (primary) hypertension: Secondary | ICD-10-CM

## 2017-01-23 ENCOUNTER — Other Ambulatory Visit: Payer: Self-pay | Admitting: Internal Medicine

## 2017-01-23 DIAGNOSIS — Z1231 Encounter for screening mammogram for malignant neoplasm of breast: Secondary | ICD-10-CM

## 2017-02-20 ENCOUNTER — Ambulatory Visit
Admission: RE | Admit: 2017-02-20 | Discharge: 2017-02-20 | Disposition: A | Discharge status: Home / Self Care | Payer: Federal, State, Local not specified - PPO | Source: Ambulatory Visit | Attending: Internal Medicine | Admitting: Internal Medicine

## 2017-02-20 DIAGNOSIS — Z1231 Encounter for screening mammogram for malignant neoplasm of breast: Secondary | ICD-10-CM

## 2017-03-15 DIAGNOSIS — Z0279 Encounter for issue of other medical certificate: Secondary | ICD-10-CM

## 2017-04-07 ENCOUNTER — Telehealth: Payer: Self-pay | Admitting: Internal Medicine

## 2017-04-07 DIAGNOSIS — I10 Essential (primary) hypertension: Secondary | ICD-10-CM

## 2017-04-11 MED ORDER — AMLODIPINE BESYLATE 5 MG PO TABS: 5.0000 mg | ORAL_TABLET | Freq: Every day | ORAL | 0 refills | Status: DC

## 2017-04-11 MED ORDER — METOPROLOL SUCCINATE ER 100 MG PO TB24: 100.0000 mg | ORAL_TABLET | Freq: Every day | ORAL | 0 refills | Status: DC

## 2017-04-11 NOTE — Telephone Encounter (Signed)
Patient has set up an appointment for Oct 24

## 2017-04-11 NOTE — Addendum Note (Signed)
Addended by: Terence Lux B on: 04/11/2017 11:54 AM   Modules accepted: Orders

## 2017-04-17 ENCOUNTER — Encounter: Payer: Self-pay | Admitting: Internal Medicine

## 2017-04-17 ENCOUNTER — Ambulatory Visit (INDEPENDENT_AMBULATORY_CARE_PROVIDER_SITE_OTHER): Payer: Federal, State, Local not specified - PPO | Admitting: Internal Medicine

## 2017-04-17 VITALS — BP 124/82 | HR 81 | Temp 98.2°F | Resp 16 | Wt 259.0 lb

## 2017-04-17 DIAGNOSIS — R7303 Prediabetes: Secondary | ICD-10-CM

## 2017-04-17 DIAGNOSIS — M25511 Pain in right shoulder: Secondary | ICD-10-CM | POA: Diagnosis not present

## 2017-04-17 DIAGNOSIS — M25512 Pain in left shoulder: Secondary | ICD-10-CM | POA: Diagnosis not present

## 2017-04-17 DIAGNOSIS — G8929 Other chronic pain: Secondary | ICD-10-CM

## 2017-04-17 DIAGNOSIS — M170 Bilateral primary osteoarthritis of knee: Secondary | ICD-10-CM | POA: Diagnosis not present

## 2017-04-17 DIAGNOSIS — I10 Essential (primary) hypertension: Secondary | ICD-10-CM

## 2017-04-17 DIAGNOSIS — Z0001 Encounter for general adult medical examination with abnormal findings: Secondary | ICD-10-CM

## 2017-04-17 DIAGNOSIS — Z0000 Encounter for general adult medical examination without abnormal findings: Secondary | ICD-10-CM

## 2017-04-17 NOTE — Assessment & Plan Note (Signed)
Following with orthopedics. 

## 2017-04-17 NOTE — Assessment & Plan Note (Signed)
Will refer to ortho

## 2017-04-17 NOTE — Patient Instructions (Addendum)
Test(s) ordered today. Your results will be released to [place] (or called to you) after review, usually within 72hours after test completion. If any changes need to be made, you will be notified at that same time.  All other Health Maintenance issues reviewed.   All recommended immunizations and age-appropriate screenings are up-to-date or discussed.  No immunizations administered today.   Medications reviewed and updated.  No changes recommended at this time.   A referral was ordered for orthopedics for your shoulder pain.   Please followup in 6 months   Health Maintenance, Female Adopting a healthy lifestyle and getting preventive care can go a long way to promote health and wellness. Talk with your health care provider about what schedule of regular examinations is right for you. This is a good chance for you to check in with your provider about disease prevention and staying healthy. In between checkups, there are plenty of things you can do on your own. Experts have done a lot of research about which lifestyle changes and preventive measures are most likely to keep you healthy. Ask your health care provider for more information. Weight and diet Eat a healthy diet  Be sure to include plenty of vegetables, fruits, low-fat dairy products, and lean protein.  Do not eat a lot of foods high in solid fats, added sugars, or salt.  Get regular exercise. This is one of the most important things you can do for your health. ? Most adults should exercise for at least 150 minutes each week. The exercise should increase your heart rate and make you sweat (moderate-intensity exercise). ? Most adults should also do strengthening exercises at least twice a week. This is in addition to the moderate-intensity exercise.  Maintain a healthy weight  Body mass index (BMI) is a measurement that can be used to identify possible weight problems. It estimates body fat based on height and weight. Your  health care provider can help determine your BMI and help you achieve or maintain a healthy weight.  For females 39 years of age and older: ? A BMI below 18.5 is considered underweight. ? A BMI of 18.5 to 24.9 is normal. ? A BMI of 25 to 29.9 is considered overweight. ? A BMI of 30 and above is considered obese.  Watch levels of cholesterol and blood lipids  You should start having your blood tested for lipids and cholesterol at 61 years of age, then have this test every 5 years.  You may need to have your cholesterol levels checked more often if: ? Your lipid or cholesterol levels are high. ? You are older than 61 years of age. ? You are at high risk for heart disease.  Cancer screening Lung Cancer  Lung cancer screening is recommended for adults 34-4 years old who are at high risk for lung cancer because of a history of smoking.  A yearly low-dose CT scan of the lungs is recommended for people who: ? Currently smoke. ? Have quit within the past 15 years. ? Have at least a 30-pack-year history of smoking. A pack year is smoking an average of one pack of cigarettes a day for 1 year.  Yearly screening should continue until it has been 15 years since you quit.  Yearly screening should stop if you develop a health problem that would prevent you from having lung cancer treatment.  Breast Cancer  Practice breast self-awareness. This means understanding how your breasts normally appear and feel.  It also means doing  regular breast self-exams. Let your health care provider know about any changes, no matter how small.  If you are in your 20s or 30s, you should have a clinical breast exam (CBE) by a health care provider every 1-3 years as part of a regular health exam.  If you are 28 or older, have a CBE every year. Also consider having a breast X-ray (mammogram) every year.  If you have a family history of breast cancer, talk to your health care provider about genetic  screening.  If you are at high risk for breast cancer, talk to your health care provider about having an MRI and a mammogram every year.  Breast cancer gene (BRCA) assessment is recommended for women who have family members with BRCA-related cancers. BRCA-related cancers include: ? Breast. ? Ovarian. ? Tubal. ? Peritoneal cancers.  Results of the assessment will determine the need for genetic counseling and BRCA1 and BRCA2 testing.  Cervical Cancer Your health care provider may recommend that you be screened regularly for cancer of the pelvic organs (ovaries, uterus, and vagina). This screening involves a pelvic examination, including checking for microscopic changes to the surface of your cervix (Pap test). You may be encouraged to have this screening done every 3 years, beginning at age 55.  For women ages 75-65, health care providers may recommend pelvic exams and Pap testing every 3 years, or they may recommend the Pap and pelvic exam, combined with testing for human papilloma virus (HPV), every 5 years. Some types of HPV increase your risk of cervical cancer. Testing for HPV may also be done on women of any age with unclear Pap test results.  Other health care providers may not recommend any screening for nonpregnant women who are considered low risk for pelvic cancer and who do not have symptoms. Ask your health care provider if a screening pelvic exam is right for you.  If you have had past treatment for cervical cancer or a condition that could lead to cancer, you need Pap tests and screening for cancer for at least 20 years after your treatment. If Pap tests have been discontinued, your risk factors (such as having a new sexual partner) need to be reassessed to determine if screening should resume. Some women have medical problems that increase the chance of getting cervical cancer. In these cases, your health care provider may recommend more frequent screening and Pap  tests.  Colorectal Cancer  This type of cancer can be detected and often prevented.  Routine colorectal cancer screening usually begins at 61 years of age and continues through 61 years of age.  Your health care provider may recommend screening at an earlier age if you have risk factors for colon cancer.  Your health care provider may also recommend using home test kits to check for hidden blood in the stool.  A small camera at the end of a tube can be used to examine your colon directly (sigmoidoscopy or colonoscopy). This is done to check for the earliest forms of colorectal cancer.  Routine screening usually begins at age 72.  Direct examination of the colon should be repeated every 5-10 years through 61 years of age. However, you may need to be screened more often if early forms of precancerous polyps or small growths are found.  Skin Cancer  Check your skin from head to toe regularly.  Tell your health care provider about any new moles or changes in moles, especially if there is a change in a mole's  shape or color.  Also tell your health care provider if you have a mole that is larger than the size of a pencil eraser.  Always use sunscreen. Apply sunscreen liberally and repeatedly throughout the day.  Protect yourself by wearing long sleeves, pants, a wide-brimmed hat, and sunglasses whenever you are outside.  Heart disease, diabetes, and high blood pressure  High blood pressure causes heart disease and increases the risk of stroke. High blood pressure is more likely to develop in: ? People who have blood pressure in the high end of the normal range (130-139/85-89 mm Hg). ? People who are overweight or obese. ? People who are African American.  If you are 65-64 years of age, have your blood pressure checked every 3-5 years. If you are 12 years of age or older, have your blood pressure checked every year. You should have your blood pressure measured twice-once when you are at  a hospital or clinic, and once when you are not at a hospital or clinic. Record the average of the two measurements. To check your blood pressure when you are not at a hospital or clinic, you can use: ? An automated blood pressure machine at a pharmacy. ? A home blood pressure monitor.  If you are between 53 years and 43 years old, ask your health care provider if you should take aspirin to prevent strokes.  Have regular diabetes screenings. This involves taking a blood sample to check your fasting blood sugar level. ? If you are at a normal weight and have a low risk for diabetes, have this test once every three years after 61 years of age. ? If you are overweight and have a high risk for diabetes, consider being tested at a younger age or more often. Preventing infection Hepatitis B  If you have a higher risk for hepatitis B, you should be screened for this virus. You are considered at high risk for hepatitis B if: ? You were born in a country where hepatitis B is common. Ask your health care provider which countries are considered high risk. ? Your parents were born in a high-risk country, and you have not been immunized against hepatitis B (hepatitis B vaccine). ? You have HIV or AIDS. ? You use needles to inject street drugs. ? You live with someone who has hepatitis B. ? You have had sex with someone who has hepatitis B. ? You get hemodialysis treatment. ? You take certain medicines for conditions, including cancer, organ transplantation, and autoimmune conditions.  Hepatitis C  Blood testing is recommended for: ? Everyone born from [birth year] through 1965. ? Anyone with known risk factors for hepatitis C.  Sexually transmitted infections (STIs)  You should be screened for sexually transmitted infections (STIs) including gonorrhea and chlamydia if: ? You are sexually active and are younger than 61 years of age. ? You are older than 61 years of age and your health care provider tells  you that you are at risk for this type of infection. ? Your sexual activity has changed since you were last screened and you are at an increased risk for chlamydia or gonorrhea. Ask your health care provider if you are at risk.  If you do not have HIV, but are at risk, it may be recommended that you take a prescription medicine daily to prevent HIV infection. This is called pre-exposure prophylaxis (PrEP). You are considered at risk if: ? You are sexually active and do not regularly use condoms or know  the HIV status of your partner(s). ? You take drugs by injection. ? You are sexually active with a partner who has HIV.  Talk with your health care provider about whether you are at high risk of being infected with HIV. If you choose to begin PrEP, you should first be tested for HIV. You should then be tested every 3 months for as long as you are taking PrEP. Pregnancy  If you are premenopausal and you may become pregnant, ask your health care provider about preconception counseling.  If you may become pregnant, take 400 to 800 micrograms (mcg) of folic acid every day.  If you want to prevent pregnancy, talk to your health care provider about birth control (contraception). Osteoporosis and menopause  Osteoporosis is a disease in which the bones lose minerals and strength with aging. This can result in serious bone fractures. Your risk for osteoporosis can be identified using a bone density scan.  If you are 46 years of age or older, or if you are at risk for osteoporosis and fractures, ask your health care provider if you should be screened.  Ask your health care provider whether you should take a calcium or vitamin D supplement to lower your risk for osteoporosis.  Menopause may have certain physical symptoms and risks.  Hormone replacement therapy may reduce some of these symptoms and risks. Talk to your health care provider about whether hormone replacement therapy is right for  you. Follow these instructions at home:  Schedule regular health, dental, and eye exams.  Stay current with your immunizations.  Do not use any tobacco products including cigarettes, chewing tobacco, or electronic cigarettes.  If you are pregnant, do not drink alcohol.  If you are breastfeeding, limit how much and how often you drink alcohol.  Limit alcohol intake to no more than 1 drink per day for nonpregnant women. One drink equals 12 ounces of beer, 5 ounces of wine, or 1 ounces of hard liquor.  Do not use street drugs.  Do not share needles.  Ask your health care provider for help if you need support or information about quitting drugs.  Tell your health care provider if you often feel depressed.  Tell your health care provider if you have ever been abused or do not feel safe at home. This information is not intended to replace advice given to you by your health care provider. Make sure you discuss any questions you have with your health care provider. Document Released: 12/25/2010 Document Revised: 11/17/2015 Document Reviewed: 03/15/2015 Elsevier Interactive Patient Education  [employer].

## 2017-04-17 NOTE — Progress Notes (Signed)
Subjective:    Patient ID: [name], female    DOB: 02/01/1956, 61 y.o.   MRN: [id number]  HPI She is here for a physical exam.   She wants to retire in a year and a half.  She has a lot of sick time built up and ideally wants to get written out of work for an extended period of time to avoid losing it.    She has been seeing ortho for her knee pain.  She has chronic b/l shoulder pain and would like to see ortho for that was well.     Medications and allergies reviewed with patient and updated if appropriate.  Patient Active Problem List   Diagnosis Date Noted  . Bilateral primary osteoarthritis of knee 04/11/2016  . Alopecia areata 09/24/2013  . Unspecified vitamin D deficiency 10/01/2012  . Pre-diabetes 03/24/2009  . Essential hypertension 02/26/2008    Current Outpatient Prescriptions on File Prior to Visit  Medication Sig Dispense Refill  . amLODipine (NORVASC) 5 MG tablet Take 1 tablet (5 mg total) by mouth daily. 30 tablet 0  . aspirin 81 MG tablet Take 81 mg by mouth daily.    . Biotin 5000 MCG CAPS Take by mouth daily.    . Cholecalciferol (VITAMIN D-3) 1000 UNITS CAPS Take 1 capsule by mouth daily.    Marland Kitchen loratadine (CLARITIN) 10 MG tablet Take 10 mg by mouth daily.    . metoprolol succinate (TOPROL-XL) 100 MG 24 hr tablet Take 1 tablet (100 mg total) by mouth daily. 30 tablet 0   No current facility-administered medications on file prior to visit.     Past Medical History:  Diagnosis Date  . Herpes zoster 1992   posterior thorax  . Hyperlipidemia 2006    LDL 104, HDL 50  . Hypertension   . Other abnormal glucose     Past Surgical History:  Procedure Laterality Date  . COLONOSCOPY  2007   negative  . CYSTECTOMY     peri rectal  . FOOT SURGERY     bilaterally  . INCONTINENCE SURGERY  2005   Dr Quincy Simmonds  . LIPOMA EXCISION  2011    L thorax , Dr Georgette Dover    Social History   Social History  . Marital status: Single    Spouse name: N/A  . Number  of children: N/A  . Years of education: N/A   Social History Main Topics  . Smoking status: Former Smoker    Quit date: 06/25/1980  . Smokeless tobacco: Never Used     Comment: smoked [age] , up to 1/2 ppd  . Alcohol use Yes     Comment:  very rarely  . Drug use: No  . Sexual activity: Yes    Birth control/ protection: None   Other Topics Concern  . None   Social History Narrative   No regular exercise    Family History  Problem Relation Age of Onset  . Diabetes Mother   . Stroke Father 33  . Hypertension Father   . Hypertension Sister   . Hypertension Maternal Aunt   . Diabetes Maternal Aunt   . Cancer Neg Hx   . Colon cancer Neg Hx   . Breast cancer Neg Hx     Review of Systems  Constitutional: Positive for fatigue. Negative for chills and fever.  Eyes: Negative for visual disturbance.  Respiratory: Negative for cough, shortness of breath and wheezing.   Cardiovascular: Negative for chest pain, palpitations and  leg swelling.  Gastrointestinal: Negative for abdominal pain, blood in stool, constipation, diarrhea and nausea.       Freq BM  Genitourinary: Negative for dysuria and hematuria.  Musculoskeletal: Positive for arthralgias (b/l shoulder pain, knee pain).  Skin: Negative for color change and rash.  Neurological: Negative for light-headedness and headaches.  Psychiatric/Behavioral: Negative for dysphoric mood and sleep disturbance. The patient is not nervous/anxious.        Objective:   Vitals:   04/17/17 1511  BP: 124/82  Pulse: 81  Resp: 16  Temp: 98.2 F (36.8 C)  SpO2: 98%   Filed Weights   04/17/17 1511  Weight: 259 lb (117.5 kg)   Body mass index is 44.46 kg/m.  Wt Readings from Last 3 Encounters:  04/17/17 259 lb (117.5 kg)  07/25/16 254 lb (115.2 kg)  07/18/16 254 lb 12.8 oz (115.6 kg)     Physical Exam Constitutional: She appears well-developed and well-nourished. No distress.  HENT:  Head: Normocephalic and atraumatic.  Right  Ear: External ear normal. Normal ear canal and TM Left Ear: External ear normal.  Normal ear canal and TM Mouth/Throat: Oropharynx is clear and moist.  Eyes: Conjunctivae and EOM are normal.  Neck: Neck supple. No tracheal deviation present. No thyromegaly present.  No carotid bruit  Cardiovascular: Normal rate, regular rhythm and normal heart sounds.   No murmur heard.  No edema. Pulmonary/Chest: Effort normal and breath sounds normal. No respiratory distress. She has no wheezes. She has no rales.  Breast: deferred to Gyn Abdominal: Soft. She exhibits no distension. There is no tenderness.  Lymphadenopathy: She has no cervical adenopathy.  Skin: Skin is warm and dry. She is not diaphoretic.  Psychiatric: She has a normal mood and affect. Her behavior is normal.        Assessment & Plan:   Physical exam: Screening blood work   ordered Immunizations flu vaccine deferred, discussed shingles vaccine Colonoscopy    up-to-date Mammogram    up-to-date Gyn    up-to-date Eye exams  Up to date  EKG   last EKG 2014 Exercise - no regular exericse Weight  Not eating well - advised weight loss Skin  No concerns Substance abuse   none  See Problem List for Assessment and Plan of chronic medical problems.  FU in 6 months

## 2017-04-17 NOTE — Assessment & Plan Note (Signed)
Check a1c Low sugar / carb diet Stressed regular exercise   

## 2017-04-17 NOTE — Assessment & Plan Note (Signed)
BP well controlled Current regimen effective and well tolerated Continue current medications at current doses cmp  

## 2017-04-18 ENCOUNTER — Other Ambulatory Visit (INDEPENDENT_AMBULATORY_CARE_PROVIDER_SITE_OTHER): Payer: Federal, State, Local not specified - PPO

## 2017-04-18 ENCOUNTER — Encounter: Payer: Self-pay | Admitting: Internal Medicine

## 2017-04-18 DIAGNOSIS — I10 Essential (primary) hypertension: Secondary | ICD-10-CM

## 2017-04-18 DIAGNOSIS — Z0000 Encounter for general adult medical examination without abnormal findings: Secondary | ICD-10-CM

## 2017-04-18 DIAGNOSIS — R7303 Prediabetes: Secondary | ICD-10-CM | POA: Diagnosis not present

## 2017-04-18 LAB — CBC WITH DIFFERENTIAL/PLATELET
BASOS ABS: 0.1 10*3/uL (ref 0.0–0.1)
BASOS PCT: 0.8 % (ref 0.0–3.0)
EOS ABS: 0.2 10*3/uL (ref 0.0–0.7)
Eosinophils Relative: 2.5 % (ref 0.0–5.0)
HEMATOCRIT: 41.5 % (ref 36.0–46.0)
HEMOGLOBIN: 13.5 g/dL (ref 12.0–15.0)
LYMPHS PCT: 32.3 % (ref 12.0–46.0)
Lymphs Abs: 2.5 10*3/uL (ref 0.7–4.0)
MCHC: 32.6 g/dL (ref 30.0–36.0)
MCV: 89.6 fl (ref 78.0–100.0)
MONOS PCT: 9.7 % (ref 3.0–12.0)
Monocytes Absolute: 0.7 10*3/uL (ref 0.1–1.0)
Neutro Abs: 4.2 10*3/uL (ref 1.4–7.7)
Neutrophils Relative %: 54.7 % (ref 43.0–77.0)
Platelets: 247 10*3/uL (ref 150.0–400.0)
RBC: 4.63 Mil/uL (ref 3.87–5.11)
RDW: 14.7 % (ref 11.5–15.5)
WBC: 7.6 10*3/uL (ref 4.0–10.5)

## 2017-04-18 LAB — COMPREHENSIVE METABOLIC PANEL
ALBUMIN: 3.8 g/dL (ref 3.5–5.2)
ALK PHOS: 79 U/L (ref 39–117)
ALT: 10 U/L (ref 0–35)
AST: 14 U/L (ref 0–37)
BUN: 21 mg/dL (ref 6–23)
CALCIUM: 9.2 mg/dL (ref 8.4–10.5)
CHLORIDE: 108 meq/L (ref 96–112)
CO2: 29 mEq/L (ref 19–32)
Creatinine, Ser: 0.92 mg/dL (ref 0.40–1.20)
GFR: 79.8 mL/min (ref >60.00)
Glucose, Bld: 101 mg/dL — ABNORMAL HIGH (ref 70–99)
POTASSIUM: 4.1 meq/L (ref 3.5–5.1)
SODIUM: 141 meq/L (ref 135–145)
TOTAL PROTEIN: 6.9 g/dL (ref 6.0–8.3)
Total Bilirubin: 0.3 mg/dL (ref 0.2–1.2)

## 2017-04-18 LAB — LIPID PANEL
CHOLESTEROL: 157 mg/dL (ref 0–200)
HDL: 50.2 mg/dL (ref >39.00)
LDL Cholesterol: 96 mg/dL (ref 0–99)
NONHDL: 106.88
Total CHOL/HDL Ratio: 3
Triglycerides: 52 mg/dL (ref 0.0–149.0)
VLDL: 10.4 mg/dL (ref 0.0–40.0)

## 2017-04-18 LAB — TSH: TSH: 2.55 u[IU]/mL (ref 0.35–4.50)

## 2017-04-18 LAB — HEMOGLOBIN A1C: HEMOGLOBIN A1C: 6.1 % (ref 4.6–6.5)

## 2017-05-01 DIAGNOSIS — G8929 Other chronic pain: Secondary | ICD-10-CM | POA: Diagnosis not present

## 2017-05-01 DIAGNOSIS — M7542 Impingement syndrome of left shoulder: Secondary | ICD-10-CM | POA: Diagnosis not present

## 2017-05-01 DIAGNOSIS — M25512 Pain in left shoulder: Secondary | ICD-10-CM | POA: Diagnosis not present

## 2017-05-01 DIAGNOSIS — M25511 Pain in right shoulder: Secondary | ICD-10-CM | POA: Diagnosis not present

## 2017-05-01 DIAGNOSIS — M7541 Impingement syndrome of right shoulder: Secondary | ICD-10-CM | POA: Diagnosis not present

## 2017-05-01 DIAGNOSIS — M50121 Cervical disc disorder at C4-C5 level with radiculopathy: Secondary | ICD-10-CM | POA: Diagnosis not present

## 2017-05-17 ENCOUNTER — Other Ambulatory Visit: Payer: Self-pay | Admitting: Internal Medicine

## 2017-05-17 DIAGNOSIS — I10 Essential (primary) hypertension: Secondary | ICD-10-CM

## 2017-06-04 DIAGNOSIS — M25512 Pain in left shoulder: Secondary | ICD-10-CM | POA: Diagnosis not present

## 2017-06-04 DIAGNOSIS — M25511 Pain in right shoulder: Secondary | ICD-10-CM | POA: Diagnosis not present

## 2017-06-04 DIAGNOSIS — G8929 Other chronic pain: Secondary | ICD-10-CM | POA: Diagnosis not present

## 2017-06-10 DIAGNOSIS — G8929 Other chronic pain: Secondary | ICD-10-CM | POA: Diagnosis not present

## 2017-06-10 DIAGNOSIS — M25511 Pain in right shoulder: Secondary | ICD-10-CM | POA: Diagnosis not present

## 2017-06-10 DIAGNOSIS — M25512 Pain in left shoulder: Secondary | ICD-10-CM | POA: Diagnosis not present

## 2017-06-19 DIAGNOSIS — M25512 Pain in left shoulder: Secondary | ICD-10-CM | POA: Diagnosis not present

## 2017-06-19 DIAGNOSIS — M25511 Pain in right shoulder: Secondary | ICD-10-CM | POA: Diagnosis not present

## 2017-06-19 DIAGNOSIS — G8929 Other chronic pain: Secondary | ICD-10-CM | POA: Diagnosis not present

## 2017-06-19 DIAGNOSIS — S46011D Strain of muscle(s) and tendon(s) of the rotator cuff of right shoulder, subsequent encounter: Secondary | ICD-10-CM | POA: Diagnosis not present

## 2017-06-20 ENCOUNTER — Telehealth: Payer: Self-pay | Admitting: Internal Medicine

## 2017-06-20 NOTE — Telephone Encounter (Signed)
Patient dropped off clearance form from [facility].  Is requesting form to be faxed in.  Would like phone call once sent. Form has been given to the CMA for Dr. Quay Burow to advise on.

## 2017-06-26 NOTE — Telephone Encounter (Signed)
Surgical Clearance has been faxed to [facility].

## 2017-09-11 DIAGNOSIS — Z01419 Encounter for gynecological examination (general) (routine) without abnormal findings: Secondary | ICD-10-CM | POA: Diagnosis not present

## 2017-09-11 DIAGNOSIS — Z124 Encounter for screening for malignant neoplasm of cervix: Secondary | ICD-10-CM | POA: Diagnosis not present

## 2017-09-11 DIAGNOSIS — Z6841 Body Mass Index (BMI) 40.0 and over, adult: Secondary | ICD-10-CM | POA: Diagnosis not present

## 2017-10-09 ENCOUNTER — Ambulatory Visit: Payer: Self-pay | Admitting: Orthopedic Surgery

## 2017-10-16 ENCOUNTER — Encounter (HOSPITAL_COMMUNITY): Payer: Self-pay

## 2017-10-16 ENCOUNTER — Ambulatory Visit: Payer: Self-pay | Admitting: Orthopedic Surgery

## 2017-10-16 NOTE — H&P (Signed)
[name] is an 62 y.o. female.   Chief Complaint: R shoulder pain HPI: The patient is a 62 year old female who presents today for follow up of their shoulder. The patient is being followed for their bilateral shoulder pain. They are 2 year(s) out from when symptoms began. Symptoms reported today include: pain. Current treatment includes: activity modification. The following medication has been used for pain control: none. The patient presents today following MRI.  Note:Pain in the shoulder right greater than left.  Past Medical History:  Diagnosis Date  . Herpes zoster 1992   posterior thorax  . Hyperlipidemia 2006    LDL 104, HDL 50  . Hypertension   . Other abnormal glucose   . Ovarian cyst    Small  . Uterine fibroid     Past Surgical History:  Procedure Laterality Date  . COLONOSCOPY  2007, 07/25/2016   negative  . CYSTECTOMY     peri rectal  . FOOT SURGERY     bilaterally  . INCONTINENCE SURGERY  2005   Dr Quincy Simmonds  . LIPOMA EXCISION  2011    L thorax , Dr Georgette Dover    Family History  Problem Relation Age of Onset  . Diabetes Mother   . Stroke Father 35  . Hypertension Father   . Hypertension Sister   . Hypertension Maternal Aunt   . Diabetes Maternal Aunt   . Cancer Neg Hx   . Colon cancer Neg Hx   . Breast cancer Neg Hx    Social History:  reports that she quit smoking about 37 years ago. She has never used smokeless tobacco. She reports that she drinks alcohol. She reports that she does not use drugs.  Allergies:  Allergies  Allergen Reactions  . Benazepril Hcl     REACTION: angioedema of lip. She cannot take angiotensin receptor blockers because of this history.     (Not in a hospital admission)  No results found for this or any previous visit (from the past 48 hour(s)). No results found.  Review of Systems  Constitutional: Negative.   HENT: Negative.   Eyes: Negative.   Respiratory: Negative.   Cardiovascular: Negative.   Gastrointestinal:  Negative.   Genitourinary: Negative.   Musculoskeletal: Positive for joint pain.  Skin: Negative.   Neurological: Negative.     There were no vitals taken for this visit. Physical Exam  Constitutional: She is oriented to person, place, and time. She appears well-developed.  HENT:  Head: Normocephalic.  Eyes: Pupils are equal, round, and reactive to light.  Neck: Normal range of motion.  Cardiovascular: Normal rate.  Respiratory: Effort normal.  GI: Soft.  Musculoskeletal:  Right shoulder positive impingement sign positive secondary impingement sign of the shoulder she is able to raise her arm over shoulder. Nontender over the [facility]. Neurovascular intact left shoulder positive impingement sign.   Neurological: She is alert and oriented to person, place, and time.    MRI right shoulder demonstrates a large full-thickness tear of the supraspinatus and into the anterior infraspinatus. Tendon retraction noted. Mild arthrosis moderate joint effusion.An os acromiale he is noted on the right.  Assessment/Plan R shoulder RCT  Patient has bilateral roots rotator cuff tears supraspinatus retracted. Also an underlying os acromiale. She reports no specific trauma associated with these.  We discussed proceeding with repair on the right right shoulder arthroscopy subacromial decompression and rotator cuff repair utilizing suture anchors. [place] discussed risk minutes including bleeding infection inability to repair need for revision  or total joint replacement in the future also time out of work and up to 3-5 months of recovery. She understands and wants to proceed. She is otherwise healthy. Please send a copy to her primary care medical physician  Plan right shoulder arthroscopy, SAD, mini-open RCR  Santiel Topper, Conley Rolls., PA-C for Dr. Tonita Cong 10/16/2017, 3:06 PM

## 2017-10-16 NOTE — Patient Instructions (Signed)
Your procedure is scheduled on: Friday, October 18, 2017   Surgery Time:  1:00PM-3:00PM   Report to [place]  Entrance    Report to admitting at 11:00 AM   Call this number if you have problems the morning of surgery [phone]   Do not eat food:After Midnight.   Do NOT smoke after Midnight   May have liquids until 7 AM morning of surgery  CLEAR LIQUID DIET   Foods Allowed                                                                     Foods Excluded  Coffee and tea, regular and decaf                             liquids that you cannot  Plain Jell-O in any flavor                                             see through such as: Fruit ices (not with fruit pulp)                                     milk, soups, orange juice  Iced Popsicles                                    All solid food Carbonated beverages, regular and diet                                    Cranberry, grape and apple juices Sports drinks like Gatorade Lightly seasoned clear broth or consume(fat free) Sugar, honey syrup  Sample Menu Breakfast                                Lunch                                     Supper Cranberry juice                    Beef broth                            Chicken broth Jell-O                                     Grape juice                           Apple juice Coffee or tea  Jell-O                                      Popsicle                                                Coffee or tea                        Coffee or tea   Take these medicines the morning of surgery with A SIP OF WATER: Amlodipine, Metoprolol                               You may not have any metal on your body including hair pins, jewelry, and body piercings             Do not wear make-up, lotions, powders, perfumes/cologne, or deodorant             Do not wear nail polish.  Do not shave  48 hours prior to surgery.               Do not bring valuables  to the hospital. [place].   Contacts, dentures or bridgework may not be worn into surgery.   Leave suitcase in the car. After surgery it may be brought to your room.   Special Instructions: Bring a copy of your healthcare power of attorney and living will documents         the day of surgery if you haven't scanned them in before.              Please read over the following fact sheets you were given:  [facility] - Preparing for Surgery Before surgery, you can play an important role.  Because skin is not sterile, your skin needs to be as free of germs as possible.  You can reduce the number of germs on your skin by washing with CHG (chlorahexidine gluconate) soap before surgery.  CHG is an antiseptic cleaner which kills germs and bonds with the skin to continue killing germs even after washing. Please DO NOT use if you have an allergy to CHG or antibacterial soaps.  If your skin becomes reddened/irritated stop using the CHG and inform your nurse when you arrive at Short Stay. Do not shave (including legs and underarms) for at least 48 hours prior to the first CHG shower.  You may shave your face/neck.  Please follow these instructions carefully:  1.  Shower with CHG Soap the night before surgery and the  morning of surgery.  2.  If you choose to wash your hair, wash your hair first as usual with your normal  shampoo.  3.  After you shampoo, rinse your hair and body thoroughly to remove the shampoo.                             4.  Use CHG as you would any other liquid soap.  You can apply chg directly to the skin and wash.  Gently with a  scrungie or clean washcloth.  5.  Apply the CHG Soap to your body ONLY FROM THE NECK DOWN.   Do   not use on face/ open                           Wound or open sores. Avoid contact with eyes, ears mouth and   genitals (private parts).                       Wash face,  Genitals (private parts) with your normal  soap.             6.  Wash thoroughly, paying special attention to the area where your    surgery  will be performed.  7.  Thoroughly rinse your body with warm water from the neck down.  8.  DO NOT shower/wash with your normal soap after using and rinsing off the CHG Soap.                9.  Pat yourself dry with a clean towel.            10.  Wear clean pajamas.            11.  Place clean sheets on your bed the night of your first shower and do not  sleep with pets. Day of Surgery : Do not apply any lotions/deodorants the morning of surgery.  Please wear clean clothes to the hospital/surgery center.  FAILURE TO FOLLOW THESE INSTRUCTIONS MAY RESULT IN THE CANCELLATION OF YOUR SURGERY  PATIENT SIGNATURE_________________________________  NURSE SIGNATURE__________________________________  ________________________________________________________________________   Adam Phenix  An incentive spirometer is a tool that can help keep your lungs clear and active. This tool measures how well you are filling your lungs with each breath. Taking long deep breaths may help reverse or decrease the chance of developing breathing (pulmonary) problems (especially infection) following:  A long period of time when you are unable to move or be active. BEFORE THE PROCEDURE   If the spirometer includes an indicator to show your best effort, your nurse or respiratory therapist will set it to a desired goal.  If possible, sit up straight or lean slightly forward. Try not to slouch.  Hold the incentive spirometer in an upright position. INSTRUCTIONS FOR USE  1. Sit on the edge of your bed if possible, or sit up as far as you can in bed or on a chair. 2. Hold the incentive spirometer in an upright position. 3. Breathe out normally. 4. Place the mouthpiece in your mouth and seal your lips tightly around it. 5. Breathe in slowly and as deeply as possible, raising the piston or the ball toward the top  of the column. 6. Hold your breath for 3-5 seconds or for as long as possible. Allow the piston or ball to fall to the bottom of the column. 7. Remove the mouthpiece from your mouth and breathe out normally. 8. Rest for a few seconds and repeat Steps 1 through 7 at least 10 times every 1-2 hours when you are awake. Take your time and take a few normal breaths between deep breaths. 9. The spirometer may include an indicator to show your best effort. Use the indicator as a goal to work toward during each repetition. 10. After each set of 10 deep breaths, practice coughing to be sure your lungs are clear. If you have an incision (the cut made  at the time of surgery), support your incision when coughing by placing a pillow or rolled up towels firmly against it. Once you are able to get out of bed, walk around indoors and cough well. You may stop using the incentive spirometer when instructed by your caregiver.  RISKS AND COMPLICATIONS  Take your time so you do not get dizzy or light-headed.  If you are in pain, you may need to take or ask for pain medication before doing incentive spirometry. It is harder to take a deep breath if you are having pain. AFTER USE  Rest and breathe slowly and easily.  It can be helpful to keep track of a log of your progress. Your caregiver can provide you with a simple table to help with this. If you are using the spirometer at home, follow these instructions: [place] IF:   You are having difficultly using the spirometer.  You have trouble using the spirometer as often as instructed.  Your pain medication is not giving enough relief while using the spirometer.  You develop fever of 100.5 F (38.1 C) or higher. SEEK IMMEDIATE MEDICAL CARE IF:   You cough up bloody sputum that had not been present before.  You develop fever of 102 F (38.9 C) or greater.  You develop worsening pain at or near the incision site. MAKE SURE YOU:   Understand these  instructions.  Will watch your condition.  Will get help right away if you are not doing well or get worse. Document Released: 10/22/2006 Document Revised: 09/03/2011 Document Reviewed: 12/23/2006 [facility] Patient Information 2014 [place], [place].   ________________________________________________________________________

## 2017-10-16 NOTE — Pre-Procedure Instructions (Signed)
Last office note Dr. Quay Burow in [place] 04/17/2017

## 2017-10-16 NOTE — H&P (View-Only) (Signed)
[name] is an 62 y.o. female.   Chief Complaint: R shoulder pain HPI: The patient is a 62 year old female who presents today for follow up of their shoulder. The patient is being followed for their bilateral shoulder pain. They are 2 year(s) out from when symptoms began. Symptoms reported today include: pain. Current treatment includes: activity modification. The following medication has been used for pain control: none. The patient presents today following MRI.  Note:Pain in the shoulder right greater than left.  Past Medical History:  Diagnosis Date  . Herpes zoster 1992   posterior thorax  . Hyperlipidemia 2006    LDL 104, HDL 50  . Hypertension   . Other abnormal glucose   . Ovarian cyst    Small  . Uterine fibroid     Past Surgical History:  Procedure Laterality Date  . COLONOSCOPY  2007, 07/25/2016   negative  . CYSTECTOMY     peri rectal  . FOOT SURGERY     bilaterally  . INCONTINENCE SURGERY  2005   Dr Quincy Simmonds  . LIPOMA EXCISION  2011    L thorax , Dr Georgette Dover    Family History  Problem Relation Age of Onset  . Diabetes Mother   . Stroke Father 4  . Hypertension Father   . Hypertension Sister   . Hypertension Maternal Aunt   . Diabetes Maternal Aunt   . Cancer Neg Hx   . Colon cancer Neg Hx   . Breast cancer Neg Hx    Social History:  reports that she quit smoking about 37 years ago. She has never used smokeless tobacco. She reports that she drinks alcohol. She reports that she does not use drugs.  Allergies:  Allergies  Allergen Reactions  . Benazepril Hcl     REACTION: angioedema of lip. She cannot take angiotensin receptor blockers because of this history.     (Not in a hospital admission)  No results found for this or any previous visit (from the past 48 hour(s)). No results found.  Review of Systems  Constitutional: Negative.   HENT: Negative.   Eyes: Negative.   Respiratory: Negative.   Cardiovascular: Negative.   Gastrointestinal:  Negative.   Genitourinary: Negative.   Musculoskeletal: Positive for joint pain.  Skin: Negative.   Neurological: Negative.     There were no vitals taken for this visit. Physical Exam  Constitutional: She is oriented to person, place, and time. She appears well-developed.  HENT:  Head: Normocephalic.  Eyes: Pupils are equal, round, and reactive to light.  Neck: Normal range of motion.  Cardiovascular: Normal rate.  Respiratory: Effort normal.  GI: Soft.  Musculoskeletal:  Right shoulder positive impingement sign positive secondary impingement sign of the shoulder she is able to raise her arm over shoulder. Nontender over the [facility]. Neurovascular intact left shoulder positive impingement sign.   Neurological: She is alert and oriented to person, place, and time.    MRI right shoulder demonstrates a large full-thickness tear of the supraspinatus and into the anterior infraspinatus. Tendon retraction noted. Mild arthrosis moderate joint effusion.An os acromiale he is noted on the right.  Assessment/Plan R shoulder RCT  Patient has bilateral roots rotator cuff tears supraspinatus retracted. Also an underlying os acromiale. She reports no specific trauma associated with these.  We discussed proceeding with repair on the right right shoulder arthroscopy subacromial decompression and rotator cuff repair utilizing suture anchors. [place] discussed risk minutes including bleeding infection inability to repair need for revision  or total joint replacement in the future also time out of work and up to 3-5 months of recovery. She understands and wants to proceed. She is otherwise healthy. Please send a copy to her primary care medical physician  Plan right shoulder arthroscopy, SAD, mini-open RCR  Brixon Zhen, Conley Rolls., PA-C for Dr. Tonita Cong 10/16/2017, 3:06 PM

## 2017-10-17 ENCOUNTER — Other Ambulatory Visit: Payer: Self-pay

## 2017-10-17 ENCOUNTER — Encounter (HOSPITAL_COMMUNITY)
Admission: RE | Admit: 2017-10-17 | Discharge: 2017-10-17 | Disposition: A | Discharge status: Home / Self Care | Payer: Federal, State, Local not specified - PPO | Source: Ambulatory Visit | Attending: Specialist | Admitting: Specialist

## 2017-10-17 ENCOUNTER — Encounter (HOSPITAL_COMMUNITY): Payer: Self-pay

## 2017-10-17 DIAGNOSIS — Z01812 Encounter for preprocedural laboratory examination: Secondary | ICD-10-CM | POA: Insufficient documentation

## 2017-10-17 DIAGNOSIS — Z01810 Encounter for preprocedural cardiovascular examination: Secondary | ICD-10-CM | POA: Diagnosis not present

## 2017-10-17 DIAGNOSIS — M75101 Unspecified rotator cuff tear or rupture of right shoulder, not specified as traumatic: Secondary | ICD-10-CM | POA: Diagnosis not present

## 2017-10-17 DIAGNOSIS — R9431 Abnormal electrocardiogram [ECG] [EKG]: Secondary | ICD-10-CM | POA: Insufficient documentation

## 2017-10-17 HISTORY — DX: Prediabetes: R73.03

## 2017-10-17 HISTORY — DX: Vitamin D deficiency, unspecified: E55.9

## 2017-10-17 HISTORY — DX: Unspecified ovarian cyst, unspecified side: N83.209

## 2017-10-17 HISTORY — DX: Leiomyoma of uterus, unspecified: D25.9

## 2017-10-17 HISTORY — DX: Unilateral primary osteoarthritis, unspecified knee: M17.10

## 2017-10-17 HISTORY — DX: Nonscarring hair loss, unspecified: L65.9

## 2017-10-17 HISTORY — DX: Osteoarthritis of knee, unspecified: M17.9

## 2017-10-17 LAB — BASIC METABOLIC PANEL
Anion gap: 8 (ref 5–15)
BUN: 15 mg/dL (ref 6–20)
CALCIUM: 9.5 mg/dL (ref 8.9–10.3)
CO2: 26 mmol/L (ref 22–32)
CREATININE: 0.95 mg/dL (ref 0.44–1.00)
Chloride: 106 mmol/L (ref 101–111)
GFR calc Af Amer: >60 mL/min (ref >60)
GLUCOSE: 93 mg/dL (ref 65–99)
Potassium: 4.4 mmol/L (ref 3.5–5.1)
Sodium: 140 mmol/L (ref 135–145)

## 2017-10-17 LAB — CBC
HCT: 43.7 % (ref 36.0–46.0)
HEMOGLOBIN: 14.2 g/dL (ref 12.0–15.0)
MCH: 29.5 pg (ref 26.0–34.0)
MCHC: 32.5 g/dL (ref 30.0–36.0)
MCV: 90.7 fL (ref 78.0–100.0)
PLATELETS: 246 10*3/uL (ref 150–400)
RBC: 4.82 MIL/uL (ref 3.87–5.11)
RDW: 14 % (ref 11.5–15.5)
WBC: 7.9 10*3/uL (ref 4.0–10.5)

## 2017-10-17 LAB — HEMOGLOBIN A1C
HEMOGLOBIN A1C: 5.9 % — AB (ref 4.8–5.6)
Mean Plasma Glucose: 122.63 mg/dL

## 2017-10-17 MED ORDER — DEXTROSE 5 % IV SOLN
3.0000 g | INTRAVENOUS | Status: AC
  Administered 2017-10-18: 3 g via INTRAVENOUS
  Filled 2017-10-17: qty 3

## 2017-10-18 ENCOUNTER — Encounter (HOSPITAL_COMMUNITY): Payer: Self-pay | Admitting: *Deleted

## 2017-10-18 ENCOUNTER — Ambulatory Visit (HOSPITAL_COMMUNITY): Payer: Federal, State, Local not specified - PPO | Admitting: Anesthesiology

## 2017-10-18 ENCOUNTER — Ambulatory Visit (HOSPITAL_COMMUNITY)
Admission: RE | Admit: 2017-10-18 | Discharge: 2017-10-19 | Disposition: A | Discharge status: Home / Self Care | Payer: Federal, State, Local not specified - PPO | Source: Ambulatory Visit | Attending: Specialist | Admitting: Specialist

## 2017-10-18 ENCOUNTER — Encounter (HOSPITAL_COMMUNITY)
Admission: RE | Disposition: A | Discharge status: Home / Self Care | Payer: Self-pay | Source: Ambulatory Visit | Attending: Specialist

## 2017-10-18 DIAGNOSIS — Z7982 Long term (current) use of aspirin: Secondary | ICD-10-CM | POA: Insufficient documentation

## 2017-10-18 DIAGNOSIS — E559 Vitamin D deficiency, unspecified: Secondary | ICD-10-CM | POA: Insufficient documentation

## 2017-10-18 DIAGNOSIS — M7541 Impingement syndrome of right shoulder: Secondary | ICD-10-CM | POA: Diagnosis not present

## 2017-10-18 DIAGNOSIS — I10 Essential (primary) hypertension: Secondary | ICD-10-CM | POA: Diagnosis not present

## 2017-10-18 DIAGNOSIS — Z888 Allergy status to other drugs, medicaments and biological substances status: Secondary | ICD-10-CM | POA: Diagnosis not present

## 2017-10-18 DIAGNOSIS — R7303 Prediabetes: Secondary | ICD-10-CM | POA: Insufficient documentation

## 2017-10-18 DIAGNOSIS — Z87891 Personal history of nicotine dependence: Secondary | ICD-10-CM | POA: Diagnosis not present

## 2017-10-18 DIAGNOSIS — S43401A Unspecified sprain of right shoulder joint, initial encounter: Secondary | ICD-10-CM | POA: Insufficient documentation

## 2017-10-18 DIAGNOSIS — Z79899 Other long term (current) drug therapy: Secondary | ICD-10-CM | POA: Insufficient documentation

## 2017-10-18 DIAGNOSIS — M75121 Complete rotator cuff tear or rupture of right shoulder, not specified as traumatic: Secondary | ICD-10-CM | POA: Insufficient documentation

## 2017-10-18 DIAGNOSIS — X58XXXA Exposure to other specified factors, initial encounter: Secondary | ICD-10-CM | POA: Diagnosis not present

## 2017-10-18 DIAGNOSIS — M75101 Unspecified rotator cuff tear or rupture of right shoulder, not specified as traumatic: Secondary | ICD-10-CM

## 2017-10-18 DIAGNOSIS — S46011A Strain of muscle(s) and tendon(s) of the rotator cuff of right shoulder, initial encounter: Secondary | ICD-10-CM | POA: Diagnosis not present

## 2017-10-18 DIAGNOSIS — M75120 Complete rotator cuff tear or rupture of unspecified shoulder, not specified as traumatic: Secondary | ICD-10-CM | POA: Diagnosis present

## 2017-10-18 DIAGNOSIS — G8918 Other acute postprocedural pain: Secondary | ICD-10-CM | POA: Diagnosis not present

## 2017-10-18 HISTORY — PX: SHOULDER ARTHROSCOPY WITH ROTATOR CUFF REPAIR AND SUBACROMIAL DECOMPRESSION: SHX5686

## 2017-10-18 SURGERY — SHOULDER ARTHROSCOPY WITH ROTATOR CUFF REPAIR AND SUBACROMIAL DECOMPRESSION
Anesthesia: General | Site: Shoulder | Laterality: Right

## 2017-10-18 MED ORDER — SUCCINYLCHOLINE CHLORIDE 200 MG/10ML IV SOSY
PREFILLED_SYRINGE | INTRAVENOUS | Status: AC
  Filled 2017-10-18: qty 10

## 2017-10-18 MED ORDER — ALUM & MAG HYDROXIDE-SIMETH 200-200-20 MG/5ML PO SUSP: 30.0000 mL | ORAL | Status: DC | PRN

## 2017-10-18 MED ORDER — LIDOCAINE 2% (20 MG/ML) 5 ML SYRINGE
INTRAMUSCULAR | Status: AC
  Filled 2017-10-18: qty 5

## 2017-10-18 MED ORDER — FENTANYL CITRATE (PF) 100 MCG/2ML IJ SOLN
INTRAMUSCULAR | Status: DC | PRN
  Administered 2017-10-18: 50 ug via INTRAVENOUS

## 2017-10-18 MED ORDER — DIPHENHYDRAMINE HCL 12.5 MG/5ML PO ELIX: 12.5000 mg | ORAL_SOLUTION | ORAL | Status: DC | PRN

## 2017-10-18 MED ORDER — MAGNESIUM CITRATE PO SOLN: 1.0000 | Freq: Once | ORAL | Status: DC | PRN

## 2017-10-18 MED ORDER — BUPIVACAINE-EPINEPHRINE (PF) 0.5% -1:200000 IJ SOLN
INTRAMUSCULAR | Status: AC
Start: 2017-10-18 — End: ?
  Filled 2017-10-18: qty 30

## 2017-10-18 MED ORDER — POLYETHYLENE GLYCOL 3350 17 G PO PACK: 17.0000 g | PACK | Freq: Every day | ORAL | Status: DC | PRN

## 2017-10-18 MED ORDER — EPINEPHRINE PF 1 MG/ML IJ SOLN
INTRAMUSCULAR | Status: DC | PRN
  Administered 2017-10-18: 2 mg

## 2017-10-18 MED ORDER — FENTANYL CITRATE (PF) 100 MCG/2ML IJ SOLN
INTRAMUSCULAR | Status: AC
  Filled 2017-10-18: qty 2

## 2017-10-18 MED ORDER — DEXAMETHASONE SODIUM PHOSPHATE 10 MG/ML IJ SOLN
INTRAMUSCULAR | Status: AC
  Filled 2017-10-18: qty 1

## 2017-10-18 MED ORDER — CHLORHEXIDINE GLUCONATE 4 % EX LIQD: 60.0000 mL | Freq: Once | CUTANEOUS | Status: DC

## 2017-10-18 MED ORDER — CLONIDINE HCL (ANALGESIA) 100 MCG/ML EP SOLN
EPIDURAL | Status: DC | PRN
  Administered 2017-10-18: 50 ug

## 2017-10-18 MED ORDER — BISACODYL 5 MG PO TBEC: 5.0000 mg | DELAYED_RELEASE_TABLET | Freq: Every day | ORAL | Status: DC | PRN

## 2017-10-18 MED ORDER — PHENOL 1.4 % MT LIQD: 1.0000 | OROMUCOSAL | Status: DC | PRN

## 2017-10-18 MED ORDER — POTASSIUM CHLORIDE IN NACL 20-0.45 MEQ/L-% IV SOLN
INTRAVENOUS | Status: DC
  Administered 2017-10-18: 18:00:00 via INTRAVENOUS
  Filled 2017-10-18: qty 1000

## 2017-10-18 MED ORDER — PROPOFOL 10 MG/ML IV BOLUS
INTRAVENOUS | Status: AC
  Filled 2017-10-18: qty 20

## 2017-10-18 MED ORDER — DOCUSATE SODIUM 100 MG PO CAPS: 100.0000 mg | ORAL_CAPSULE | Freq: Two times a day (BID) | ORAL | 0 refills | Status: DC

## 2017-10-18 MED ORDER — PHENYLEPHRINE HCL 10 MG/ML IJ SOLN
INTRAMUSCULAR | Status: DC | PRN
  Administered 2017-10-18: 50 ug/min via INTRAVENOUS

## 2017-10-18 MED ORDER — OXYCODONE HCL 5 MG PO TABS: 10.0000 mg | ORAL_TABLET | ORAL | Status: DC | PRN

## 2017-10-18 MED ORDER — ONDANSETRON HCL 4 MG/2ML IJ SOLN: 4.0000 mg | Freq: Four times a day (QID) | INTRAMUSCULAR | Status: DC | PRN

## 2017-10-18 MED ORDER — SUGAMMADEX SODIUM 200 MG/2ML IV SOLN
INTRAVENOUS | Status: DC | PRN
  Administered 2017-10-18: 200 mg via INTRAVENOUS

## 2017-10-18 MED ORDER — RISAQUAD PO CAPS
1.0000 | ORAL_CAPSULE | Freq: Every day | ORAL | Status: DC
  Administered 2017-10-18 – 2017-10-19 (×2): 1 via ORAL
  Filled 2017-10-18 (×2): qty 1

## 2017-10-18 MED ORDER — POLYMYXIN B SULFATE 500000 UNITS IJ SOLR
INTRAMUSCULAR | Status: AC
  Filled 2017-10-18: qty 500000

## 2017-10-18 MED ORDER — ONDANSETRON HCL 4 MG/2ML IJ SOLN: 4.0000 mg | Freq: Once | INTRAMUSCULAR | Status: DC | PRN

## 2017-10-18 MED ORDER — ENOXAPARIN SODIUM 40 MG/0.4ML ~~LOC~~ SOLN
40.0000 mg | SUBCUTANEOUS | Status: DC
  Administered 2017-10-19: 40 mg via SUBCUTANEOUS
  Filled 2017-10-18: qty 0.4

## 2017-10-18 MED ORDER — PHENYLEPHRINE HCL 10 MG/ML IJ SOLN
INTRAMUSCULAR | Status: AC
  Filled 2017-10-18: qty 1

## 2017-10-18 MED ORDER — METOCLOPRAMIDE HCL 5 MG PO TABS: 5.0000 mg | ORAL_TABLET | Freq: Three times a day (TID) | ORAL | Status: DC | PRN

## 2017-10-18 MED ORDER — METOCLOPRAMIDE HCL 5 MG/ML IJ SOLN: 5.0000 mg | Freq: Three times a day (TID) | INTRAMUSCULAR | Status: DC | PRN

## 2017-10-18 MED ORDER — ONDANSETRON HCL 4 MG/2ML IJ SOLN
INTRAMUSCULAR | Status: AC
Start: 2017-10-18 — End: ?
  Filled 2017-10-18: qty 2

## 2017-10-18 MED ORDER — BUPIVACAINE-EPINEPHRINE 0.5% -1:200000 IJ SOLN
INTRAMUSCULAR | Status: DC | PRN
  Administered 2017-10-18: 20 mL

## 2017-10-18 MED ORDER — SUCCINYLCHOLINE CHLORIDE 200 MG/10ML IV SOSY
PREFILLED_SYRINGE | INTRAVENOUS | Status: DC | PRN
  Administered 2017-10-18: 120 mg via INTRAVENOUS

## 2017-10-18 MED ORDER — ROCURONIUM BROMIDE 10 MG/ML (PF) SYRINGE
PREFILLED_SYRINGE | INTRAVENOUS | Status: DC | PRN
  Administered 2017-10-18: 30 mg via INTRAVENOUS

## 2017-10-18 MED ORDER — METOPROLOL SUCCINATE ER 100 MG PO TB24
100.0000 mg | ORAL_TABLET | Freq: Every day | ORAL | Status: DC
  Administered 2017-10-19: 100 mg via ORAL
  Filled 2017-10-18: qty 1

## 2017-10-18 MED ORDER — LORATADINE 10 MG PO TABS
10.0000 mg | ORAL_TABLET | Freq: Every day | ORAL | Status: DC
  Administered 2017-10-19: 10 mg via ORAL
  Filled 2017-10-18: qty 1

## 2017-10-18 MED ORDER — METHOCARBAMOL 500 MG PO TABS: 500.0000 mg | ORAL_TABLET | Freq: Four times a day (QID) | ORAL | 1 refills | Status: DC | PRN

## 2017-10-18 MED ORDER — ACETAMINOPHEN 325 MG PO TABS: 325.0000 mg | ORAL_TABLET | Freq: Four times a day (QID) | ORAL | Status: DC | PRN

## 2017-10-18 MED ORDER — SODIUM CHLORIDE 0.9 % IV SOLN
INTRAVENOUS | Status: DC | PRN
  Administered 2017-10-18: 500 mL

## 2017-10-18 MED ORDER — ONDANSETRON HCL 4 MG PO TABS: 4.0000 mg | ORAL_TABLET | Freq: Four times a day (QID) | ORAL | Status: DC | PRN

## 2017-10-18 MED ORDER — ROPIVACAINE HCL 7.5 MG/ML IJ SOLN
INTRAMUSCULAR | Status: DC | PRN
  Administered 2017-10-18: 20 mL via PERINEURAL

## 2017-10-18 MED ORDER — METHOCARBAMOL 500 MG PO TABS: 500.0000 mg | ORAL_TABLET | Freq: Four times a day (QID) | ORAL | Status: DC | PRN

## 2017-10-18 MED ORDER — OXYCODONE-ACETAMINOPHEN 5-325 MG PO TABS: 1.0000 | ORAL_TABLET | Freq: Four times a day (QID) | ORAL | 0 refills | Status: DC | PRN

## 2017-10-18 MED ORDER — SUGAMMADEX SODIUM 500 MG/5ML IV SOLN
INTRAVENOUS | Status: AC
Start: 2017-10-18 — End: 2017-10-18
  Filled 2017-10-18: qty 5

## 2017-10-18 MED ORDER — PHENYLEPHRINE HCL 10 MG/ML IJ SOLN
INTRAMUSCULAR | Status: AC
Start: 2017-10-18 — End: ?
  Filled 2017-10-18: qty 1

## 2017-10-18 MED ORDER — EPINEPHRINE PF 1 MG/ML IJ SOLN
INTRAMUSCULAR | Status: AC
  Filled 2017-10-18: qty 1

## 2017-10-18 MED ORDER — LACTATED RINGERS IV SOLN
INTRAVENOUS | Status: DC
  Administered 2017-10-18 (×4): via INTRAVENOUS

## 2017-10-18 MED ORDER — PROPOFOL 10 MG/ML IV BOLUS
INTRAVENOUS | Status: DC | PRN
  Administered 2017-10-18: 200 mg via INTRAVENOUS

## 2017-10-18 MED ORDER — FENTANYL CITRATE (PF) 100 MCG/2ML IJ SOLN
100.0000 ug | INTRAMUSCULAR | Status: DC | PRN
  Administered 2017-10-18: 50 ug via INTRAVENOUS

## 2017-10-18 MED ORDER — ACETAMINOPHEN 500 MG PO TABS
1000.0000 mg | ORAL_TABLET | Freq: Four times a day (QID) | ORAL | Status: DC
  Administered 2017-10-18 – 2017-10-19 (×2): 1000 mg via ORAL
  Filled 2017-10-18 (×2): qty 2

## 2017-10-18 MED ORDER — MIDAZOLAM HCL 2 MG/2ML IJ SOLN
INTRAMUSCULAR | Status: AC
  Filled 2017-10-18: qty 2

## 2017-10-18 MED ORDER — OXYCODONE HCL 5 MG PO TABS
5.0000 mg | ORAL_TABLET | ORAL | Status: DC | PRN
  Administered 2017-10-19: 5 mg via ORAL
  Filled 2017-10-18: qty 1

## 2017-10-18 MED ORDER — HYDROMORPHONE HCL 1 MG/ML IJ SOLN: 0.5000 mg | INTRAMUSCULAR | Status: DC | PRN

## 2017-10-18 MED ORDER — BIOTIN 5000 MCG PO CAPS: 1.0000 | ORAL_CAPSULE | Freq: Every day | ORAL | Status: DC

## 2017-10-18 MED ORDER — LIDOCAINE 2% (20 MG/ML) 5 ML SYRINGE
INTRAMUSCULAR | Status: DC | PRN
  Administered 2017-10-18: 80 mg via INTRAVENOUS

## 2017-10-18 MED ORDER — METHOCARBAMOL 1000 MG/10ML IJ SOLN
500.0000 mg | Freq: Four times a day (QID) | INTRAVENOUS | Status: DC | PRN
  Filled 2017-10-18: qty 5

## 2017-10-18 MED ORDER — SUGAMMADEX SODIUM 200 MG/2ML IV SOLN
INTRAVENOUS | Status: AC
  Filled 2017-10-18: qty 2

## 2017-10-18 MED ORDER — PHENYLEPHRINE 40 MCG/ML (10ML) SYRINGE FOR IV PUSH (FOR BLOOD PRESSURE SUPPORT)
PREFILLED_SYRINGE | INTRAVENOUS | Status: DC | PRN
  Administered 2017-10-18: 80 ug via INTRAVENOUS
  Administered 2017-10-18: 120 ug via INTRAVENOUS

## 2017-10-18 MED ORDER — DOCUSATE SODIUM 100 MG PO CAPS
100.0000 mg | ORAL_CAPSULE | Freq: Two times a day (BID) | ORAL | Status: DC
  Administered 2017-10-19: 100 mg via ORAL
  Filled 2017-10-18 (×2): qty 1

## 2017-10-18 MED ORDER — DEXAMETHASONE SODIUM PHOSPHATE 10 MG/ML IJ SOLN
INTRAMUSCULAR | Status: DC | PRN
  Administered 2017-10-18: 10 mg via INTRAVENOUS

## 2017-10-18 MED ORDER — ONDANSETRON HCL 4 MG/2ML IJ SOLN
INTRAMUSCULAR | Status: DC | PRN
  Administered 2017-10-18: 4 mg via INTRAVENOUS

## 2017-10-18 MED ORDER — AMLODIPINE BESYLATE 5 MG PO TABS
5.0000 mg | ORAL_TABLET | Freq: Every day | ORAL | Status: DC
  Administered 2017-10-19: 5 mg via ORAL
  Filled 2017-10-18: qty 1

## 2017-10-18 MED ORDER — MIDAZOLAM HCL 2 MG/2ML IJ SOLN
2.0000 mg | INTRAMUSCULAR | Status: DC | PRN
  Administered 2017-10-18: 2 mg via INTRAVENOUS

## 2017-10-18 MED ORDER — VITAMIN D 1000 UNITS PO TABS
1000.0000 [IU] | ORAL_TABLET | Freq: Every day | ORAL | Status: DC
  Administered 2017-10-19: 1000 [IU] via ORAL
  Filled 2017-10-18: qty 1

## 2017-10-18 MED ORDER — FENTANYL CITRATE (PF) 100 MCG/2ML IJ SOLN: 25.0000 ug | INTRAMUSCULAR | Status: DC | PRN

## 2017-10-18 MED ORDER — CEFAZOLIN SODIUM-DEXTROSE 2-4 GM/100ML-% IV SOLN
2.0000 g | Freq: Four times a day (QID) | INTRAVENOUS | Status: AC
  Administered 2017-10-18 – 2017-10-19 (×2): 2 g via INTRAVENOUS
  Filled 2017-10-18 (×2): qty 100

## 2017-10-18 MED ORDER — MENTHOL 3 MG MT LOZG: 1.0000 | LOZENGE | OROMUCOSAL | Status: DC | PRN

## 2017-10-18 SURGICAL SUPPLY — 59 items
ANCH SUT SWLK 19.1X4.75 VT (Anchor) ×4 IMPLANT
ANCHOR NDL 9/16 CIR SZ 8 (NEEDLE) IMPLANT
ANCHOR NEEDLE 9/16 CIR SZ 8 (NEEDLE) IMPLANT
ANCHOR PEEK 4.75X19.1 SWLK C (Anchor) ×8 IMPLANT
BLADE CUDA SHAVER 3.5 (BLADE) ×3 IMPLANT
BLADE SURG SZ11 CARB STEEL (BLADE) ×3 IMPLANT
BUR OVAL 4.0 (BURR) IMPLANT
CANNULA ACUFO 5X76 (CANNULA) ×3 IMPLANT
CLEANER TIP ELECTROSURG 2X2 (MISCELLANEOUS) IMPLANT
CLOSURE WOUND 1/2 X4 (GAUZE/BANDAGES/DRESSINGS) ×1
CLOTH 2% CHLOROHEXIDINE 3PK (PERSONAL CARE ITEMS) ×3 IMPLANT
COVER SURGICAL LIGHT HANDLE (MISCELLANEOUS) ×3 IMPLANT
DRAPE ORTHO SPLIT 77X108 STRL (DRAPES) ×3
DRAPE POUCH INSTRU U-SHP 10X18 (DRAPES) ×2 IMPLANT
DRAPE STERI 35X30 U-POUCH (DRAPES) ×3 IMPLANT
DRAPE SURG ORHT 6 SPLT 77X108 (DRAPES) ×1 IMPLANT
DRSG AQUACEL AG ADV 3.5X 4 (GAUZE/BANDAGES/DRESSINGS) ×2 IMPLANT
DRSG AQUACEL AG ADV 3.5X 6 (GAUZE/BANDAGES/DRESSINGS) ×2 IMPLANT
DRSG PAD ABDOMINAL 8X10 ST (GAUZE/BANDAGES/DRESSINGS) IMPLANT
DURAPREP 26ML APPLICATOR (WOUND CARE) ×3 IMPLANT
ELECT NDL TIP 2.8 STRL (NEEDLE) IMPLANT
ELECT NEEDLE TIP 2.8 STRL (NEEDLE) IMPLANT
ELECT REM PT RETURN 15FT ADLT (MISCELLANEOUS) ×3 IMPLANT
GLOVE BIOGEL PI IND STRL 7.0 (GLOVE) ×1 IMPLANT
GLOVE BIOGEL PI INDICATOR 7.0 (GLOVE) ×2
GLOVE SURG SS PI 7.0 STRL IVOR (GLOVE) ×3 IMPLANT
GLOVE SURG SS PI 7.5 STRL IVOR (GLOVE) ×3 IMPLANT
GLOVE SURG SS PI 8.0 STRL IVOR (GLOVE) ×6 IMPLANT
GOWN STRL REUS W/TWL XL LVL3 (GOWN DISPOSABLE) ×6 IMPLANT
KIT BASIN OR (CUSTOM PROCEDURE TRAY) ×3 IMPLANT
KIT POSITION SHOULDER SCHLEI (MISCELLANEOUS) ×3 IMPLANT
MANIFOLD NEPTUNE II (INSTRUMENTS) ×3 IMPLANT
NDL SCORPION MULTI FIRE (NEEDLE) IMPLANT
NDL SPNL 18GX3.5 QUINCKE PK (NEEDLE) ×1 IMPLANT
NEEDLE SCORPION MULTI FIRE (NEEDLE) ×3 IMPLANT
NEEDLE SPNL 18GX3.5 QUINCKE PK (NEEDLE) ×3 IMPLANT
PACK SHOULDER (CUSTOM PROCEDURE TRAY) ×3 IMPLANT
POSITIONER SURGICAL ARM (MISCELLANEOUS) ×3 IMPLANT
SLING ARM FOAM STRAP XLG (SOFTGOODS) ×2 IMPLANT
SLING ARM IMMOBILIZER LRG (SOFTGOODS) IMPLANT
SLING ARM IMMOBILIZER MED (SOFTGOODS) IMPLANT
SLING ULTRA II L (ORTHOPEDIC SUPPLIES) IMPLANT
SPONGE LAP 4X18 X RAY DECT (DISPOSABLE) ×2 IMPLANT
STRIP CLOSURE SKIN 1/2X4 (GAUZE/BANDAGES/DRESSINGS) ×1 IMPLANT
SUT ETHIBOND NAB CT1 #1 30IN (SUTURE) IMPLANT
SUT ETHILON 4 0 PS 2 18 (SUTURE) ×2 IMPLANT
SUT FIBERWIRE #2 38 T-5 BLUE (SUTURE)
SUT PROLENE 3 0 PS 2 (SUTURE) ×2 IMPLANT
SUT TIGER TAPE 7 IN WHITE (SUTURE) ×2 IMPLANT
SUT VIC AB 0 UR5 27 (SUTURE) ×2 IMPLANT
SUT VIC AB 1-0 CT2 27 (SUTURE) ×2 IMPLANT
SUT VIC AB 2-0 CT2 27 (SUTURE) ×2 IMPLANT
SUTURE FIBERWR #2 38 T-5 BLUE (SUTURE) IMPLANT
TAPE FIBER 2MM 7IN #2 BLUE (SUTURE) IMPLANT
TOWEL OR 17X26 10 PK STRL BLUE (TOWEL DISPOSABLE) ×3 IMPLANT
TUBING ARTHRO INFLOW-ONLY STRL (TUBING) ×3 IMPLANT
TUBING CONNECTING 10 (TUBING) ×1 IMPLANT
TUBING CONNECTING 10' (TUBING) ×1
WAND HAND CNTRL MULTIVAC 90 (MISCELLANEOUS) IMPLANT

## 2017-10-18 NOTE — Interval H&P Note (Signed)
History and Physical Interval Note:  10/18/2017 1:13 PM  [name]  has presented today for surgery, with the diagnosis of Right shoulder rotator cuff tear  The various methods of treatment have been discussed with the patient and family. After consideration of risks, benefits and other options for treatment, the patient has consented to  Procedure(s) with comments: Right shoulder arthroscopy, subacromial decompression, mini open rotator cuff repair (Right) - 120 mins as a surgical intervention .  The patient's history has been reviewed, patient examined, no change in status, stable for surgery.  I have reviewed the patient's chart and labs.  Questions were answered to the patient's satisfaction.     Lamar Meter C

## 2017-10-18 NOTE — Progress Notes (Signed)
Assisted Dr. Turk with right, ultrasound guided, interscalene  block. Side rails up, monitors on throughout procedure. See vital signs in flow sheet. Tolerated Procedure well. 

## 2017-10-18 NOTE — Anesthesia Procedure Notes (Signed)
Procedure Name: Intubation Date/Time: 10/18/2017 1:25 PM Performed by: [place] Phyllis Abelson C, CRNA Pre-anesthesia Checklist: Patient identified, Emergency Drugs available, Suction available and Patient being monitored Patient Re-evaluated:Patient Re-evaluated prior to induction Oxygen Delivery Method: Circle system utilized Preoxygenation: Pre-oxygenation with 100% oxygen Induction Type: IV induction Ventilation: Mask ventilation without difficulty Laryngoscope Size: Mac and 3 Grade View: Grade I Tube type: Oral Tube size: 7.0 mm Number of attempts: 1 Airway Equipment and Method: Stylet and Oral airway Placement Confirmation: ETT inserted through vocal cords under direct vision,  positive ETCO2 and breath sounds checked- equal and bilateral Secured at: 21 cm Tube secured with: Tape Dental Injury: Teeth and Oropharynx as per pre-operative assessment

## 2017-10-18 NOTE — Anesthesia Preprocedure Evaluation (Addendum)
Anesthesia Evaluation  Patient identified by MRN, date of birth, ID band Patient awake    Reviewed: Allergy & Precautions, NPO status , Patient's Chart, lab work & pertinent test results, reviewed documented beta blocker date and time   Airway Mallampati: II  TM Distance: >3 FB Neck ROM: Full    Dental  (+) Teeth Intact, Dental Advisory Given   Pulmonary former smoker,    Pulmonary exam normal breath sounds clear to auscultation       Cardiovascular hypertension, Pt. on medications and Pt. on home beta blockers (-) angina(-) Past MI Normal cardiovascular exam Rhythm:Regular Rate:Normal     Neuro/Psych negative neurological ROS  negative psych ROS   GI/Hepatic negative GI ROS, Neg liver ROS,   Endo/Other  Morbid obesity  Renal/GU negative Renal ROS     Musculoskeletal  (+) Arthritis , Osteoarthritis,  Right shoulder rotator cuff tear   Abdominal   Peds  Hematology negative hematology ROS (+)   Anesthesia Other Findings Day of surgery medications reviewed with the patient.  Reproductive/Obstetrics                             Anesthesia Physical  Anesthesia Plan  ASA: III  Anesthesia Plan: General   Post-op Pain Management:    Induction: Intravenous  PONV Risk Score and Plan: 3 and Midazolam, Dexamethasone and Ondansetron  Airway Management Planned: Oral ETT  Additional Equipment:   Intra-op Plan:   Post-operative Plan: Extubation in OR  Informed Consent: I have reviewed the patients History and Physical, chart, labs and discussed the procedure including the risks, benefits and alternatives for the proposed anesthesia with the patient or authorized representative who has indicated his/her understanding and acceptance.   Dental advisory given  Plan Discussed with: CRNA  Anesthesia Plan Comments:         Anesthesia Quick Evaluation  

## 2017-10-18 NOTE — Anesthesia Postprocedure Evaluation (Signed)
Anesthesia Post Note  Patient: Barbette Nee  Procedure(s) Performed: Right shoulder arthroscopy, subacromial decompression, labrial debridement mini open rotator cuff repair (Right Shoulder)     Patient location during evaluation: PACU Anesthesia Type: General Level of consciousness: awake and alert Pain management: pain level controlled Vital Signs Assessment: post-procedure vital signs reviewed and stable Respiratory status: spontaneous breathing, nonlabored ventilation, respiratory function stable and patient connected to nasal cannula oxygen Cardiovascular status: blood pressure returned to baseline and stable Postop Assessment: no apparent nausea or vomiting Anesthetic complications: no    Last Vitals:  Vitals:   10/18/17 1530 10/18/17 1545  BP: 138/73 (!) 145/80  Pulse: (!) 59 (!) 58  Resp: 11 (!) 22  Temp: (!) 36.4 C   SpO2: 98% 98%    Last Pain:  Vitals:   10/18/17 1530  TempSrc:   PainSc: 0-No pain                 Catalina Gravel

## 2017-10-18 NOTE — Anesthesia Procedure Notes (Signed)
Anesthesia Regional Block: Interscalene brachial plexus block   Pre-Anesthetic Checklist: ,, timeout performed, Correct Patient, Correct Site, Correct Laterality, Correct Procedure, Correct Position, site marked, Risks and benefits discussed,  Surgical consent,  Pre-op evaluation,  At surgeon's request and post-op pain management  Laterality: Right  Prep: chloraprep       Needles:  Injection technique: Single-shot  Needle Type: Echogenic Stimulator Needle     Needle Length: 5cm  Needle Gauge: 22     Additional Needles:   Procedures:,,,, ultrasound used (permanent image in chart),,,,  Narrative:  Start time: 10/18/2017 12:45 PM End time: 10/18/2017 12:55 PM Injection made incrementally with aspirations every 5 mL.  Performed by: Personally  Anesthesiologist: Catalina Gravel, MD  Additional Notes: Functioning IV was confirmed and monitors were applied.  A 38mm 22ga Arrow echogenic stimulator needle was used. Sterile prep and drape, hand hygiene, and sterile gloves were used.  Negative aspiration and negative test dose prior to incremental administration of local anesthetic. The patient tolerated the procedure well.  Ultrasound guidance: relevent anatomy identified, needle position confirmed, local anesthetic spread visualized around nerve(s), vascular puncture avoided.  Image printed for medical record.

## 2017-10-18 NOTE — Discharge Instructions (Signed)

## 2017-10-18 NOTE — Brief Op Note (Signed)
10/18/2017  2:57 PM  PATIENT:  [name]  62 y.o. female  PRE-OPERATIVE DIAGNOSIS:  Right shoulder rotator cuff tear  POST-OPERATIVE DIAGNOSIS:  Right shoulder rotator cuff tear  PROCEDURE:  Procedure(s) with comments: Right shoulder arthroscopy, subacromial decompression, labrial debridement mini open rotator cuff repair (Right) - 120 mins  SURGEON:  Surgeon(s) and Role:    Susa Day, MD - Primary  PHYSICIAN ASSISTANT:   ASSISTANTS: [place]   ANESTHESIA:   general  EBL:  min   BLOOD ADMINISTERED:none  DRAINS: none   LOCAL MEDICATIONS USED:  MARCAINE     SPECIMEN:  No Specimen  DISPOSITION OF SPECIMEN:  PATHOLOGY  COUNTS:  YES  TOURNIQUET:  * No tourniquets in log *  DICTATION: .Other Dictation: Dictation Number [id number]  PLAN OF CARE: Admit for overnight observation  PATIENT DISPOSITION:  PACU - hemodynamically stable.   Delay start of Pharmacological VTE agent (>24hrs) due to surgical blood loss or risk of bleeding: no

## 2017-10-18 NOTE — Transfer of Care (Signed)
Immediate Anesthesia Transfer of Care Note  Patient: [name]  Procedure(s) Performed: Right shoulder arthroscopy, subacromial decompression, labrial debridement mini open rotator cuff repair (Right Shoulder)  Patient Location: PACU  Anesthesia Type:GA combined with regional for post-op pain  Level of Consciousness: awake, alert  and oriented  Airway & Oxygen Therapy: Patient Spontanous Breathing and Patient connected to face mask oxygen  Post-op Assessment: Report given to RN and Post -op Vital signs reviewed and stable  Post vital signs: Reviewed and stable  Last Vitals:  Vitals Value Taken Time  BP 135/77 10/18/2017  3:19 PM  Temp    Pulse 66 10/18/2017  3:23 PM  Resp 22 10/18/2017  3:23 PM  SpO2 94 % 10/18/2017  3:23 PM  Vitals shown include unvalidated device data.  Last Pain:  Vitals:   10/18/17 1114  TempSrc:   PainSc: 0-No pain         Complications: No apparent anesthesia complications

## 2017-10-19 DIAGNOSIS — E559 Vitamin D deficiency, unspecified: Secondary | ICD-10-CM | POA: Diagnosis not present

## 2017-10-19 DIAGNOSIS — R7303 Prediabetes: Secondary | ICD-10-CM | POA: Diagnosis not present

## 2017-10-19 DIAGNOSIS — I10 Essential (primary) hypertension: Secondary | ICD-10-CM | POA: Diagnosis not present

## 2017-10-19 DIAGNOSIS — M7541 Impingement syndrome of right shoulder: Secondary | ICD-10-CM | POA: Diagnosis not present

## 2017-10-19 DIAGNOSIS — M75121 Complete rotator cuff tear or rupture of right shoulder, not specified as traumatic: Secondary | ICD-10-CM | POA: Diagnosis not present

## 2017-10-19 DIAGNOSIS — Z79899 Other long term (current) drug therapy: Secondary | ICD-10-CM | POA: Diagnosis not present

## 2017-10-19 DIAGNOSIS — M75101 Unspecified rotator cuff tear or rupture of right shoulder, not specified as traumatic: Secondary | ICD-10-CM | POA: Diagnosis not present

## 2017-10-19 DIAGNOSIS — S43401A Unspecified sprain of right shoulder joint, initial encounter: Secondary | ICD-10-CM | POA: Diagnosis not present

## 2017-10-19 DIAGNOSIS — Z87891 Personal history of nicotine dependence: Secondary | ICD-10-CM | POA: Diagnosis not present

## 2017-10-19 DIAGNOSIS — Z888 Allergy status to other drugs, medicaments and biological substances status: Secondary | ICD-10-CM | POA: Diagnosis not present

## 2017-10-19 DIAGNOSIS — Z7982 Long term (current) use of aspirin: Secondary | ICD-10-CM | POA: Diagnosis not present

## 2017-10-19 DIAGNOSIS — X58XXXA Exposure to other specified factors, initial encounter: Secondary | ICD-10-CM | POA: Diagnosis not present

## 2017-10-19 LAB — BASIC METABOLIC PANEL
ANION GAP: 10 (ref 5–15)
BUN: 18 mg/dL (ref 6–20)
CALCIUM: 9.5 mg/dL (ref 8.9–10.3)
CO2: 23 mmol/L (ref 22–32)
Chloride: 108 mmol/L (ref 101–111)
Creatinine, Ser: 0.98 mg/dL (ref 0.44–1.00)
GFR calc Af Amer: >60 mL/min (ref >60)
GLUCOSE: 125 mg/dL — AB (ref 65–99)
Potassium: 4.6 mmol/L (ref 3.5–5.1)
Sodium: 141 mmol/L (ref 135–145)

## 2017-10-19 NOTE — Progress Notes (Signed)
Pt to d/c home. AVS reviewed and "My Chart" discussed with pt. Pt capable of verbalizing medications, signs and symptoms of infection, and follow-up appointments. Remains hemodynamically stable. No signs and symptoms of distress. Educated pt to return to ER in the case of SOB, dizziness, or chest pain.  

## 2017-10-19 NOTE — Evaluation (Signed)
Occupational Therapy Evaluation Patient Details Name: [name] MRN: [id number] DOB: 1956/06/09 Today's Date: 10/19/2017    History of Present Illness Pt s/p R RTC repair   Clinical Impression   OT education complete.  Handout provided.      Follow Up Recommendations  Follow surgeon's recommendation for DC plan and follow-up therapies    Equipment Recommendations  None recommended by OT       Precautions / Restrictions Precautions Precautions: Shoulder Shoulder Interventions: At all times;Shoulder sling/immobilizer Precaution Comments: Pend ok, elbow, wrist, hand ROM OK Required Braces or Orthoses: Sling Restrictions Weight Bearing Restrictions: Yes RUE Weight Bearing: Non weight bearing      Mobility Bed Mobility Overal bed mobility: Independent                Transfers Overall transfer level: Independent                        ADL either performed or assessed with clinical judgement         Vision Patient Visual Report: No change from baseline              Pertinent Vitals/Pain Pain Assessment: 0-10 Pain Score: 3  Pain Descriptors / Indicators: Sore Pain Intervention(s): Limited activity within patient's tolerance;Repositioned     Hand Dominance     Extremity/Trunk Assessment Upper Extremity Assessment Upper Extremity Assessment: RUE deficits/detail RUE Deficits / Details: s/p SX           Communication Communication Communication: No difficulties   Cognition Arousal/Alertness: Awake/alert Behavior During Therapy: WFL for tasks assessed/performed Overall Cognitive Status: Within Functional Limits for tasks assessed                                        Exercises Shoulder Exercises Pendulum Exercise: AAROM;10 reps;Standing;Right Elbow Flexion: AROM;10 reps;Standing;Right Elbow Extension: AROM;10 reps;Standing;Right   Shoulder Instructions Shoulder Instructions Donning/doffing shirt without moving  shoulder: Minimal assistance;Caregiver independent with task Method for sponge bathing under operated UE: Minimal assistance;Caregiver independent with task Donning/doffing sling/immobilizer: Minimal assistance;Caregiver independent with task Correct positioning of sling/immobilizer: Minimal assistance;Caregiver independent with task Pendulum exercises (written home exercise program): Supervision/safety;Caregiver independent with task ROM for elbow, wrist and digits of operated UE: Supervision/safety Sling wearing schedule (on at all times/off for ADL's): Supervision/safety Proper positioning of operated UE when showering: Supervision/safety Positioning of UE while sleeping: Supervision/safety;Caregiver independent with task    Home Living Family/patient expects to be discharged to:: Private residence Living Arrangements: Children Available Help at Discharge: Family Type of Home: House       Home Layout: One level         Biochemist, clinical: Standard     Home Equipment: None          Prior Functioning/Environment Level of Independence: Independent                 OT Problem List:        OT Treatment/Interventions:      OT Goals(Current goals can be found in the care plan section) Acute Rehab OT Goals Patient Stated Goal: home today  OT Frequency:                AM-PAC PT "6 Clicks" Daily Activity     Outcome Measure Help from another person eating meals?: None Help from another person taking care of personal grooming?: A Little  Help from another person toileting, which includes using toliet, bedpan, or urinal?: A Little Help from another person bathing (including washing, rinsing, drying)?: A Little Help from another person to put on and taking off regular upper body clothing?: A Little Help from another person to put on and taking off regular lower body clothing?: A Little 6 Click Score: 19   End of Session Nurse Communication: Mobility status  Activity  Tolerance: Patient tolerated treatment well Patient left: in chair;with family/visitor present                   Time: 0950-1005 OT Time Calculation (min): 15 min Charges:  OT General Charges $OT Visit: 1 Visit OT Evaluation $OT Eval Moderate Complexity: 1 Mod G-Codes:     Kari Baars, OT [phone]  Payton Mccallum D 10/19/2017, 11:03 AM

## 2017-10-19 NOTE — Progress Notes (Signed)
   Subjective:  Patient reports pain as mild.  No other complaints of nausea or vomiting no shortness of breath no chest pain.  Objective:   VITALS:   Vitals:   10/18/17 2055 10/19/17 0112 10/19/17 0115 10/19/17 0544  BP: 110/70 (!) 144/81 139/78 (!) 147/84  Pulse: 65 68 70 (!) 58  Resp: 16 16  16   Temp: 97.9 F (36.6 C) (!) 97.5 F (36.4 C)  (!) 97.5 F (36.4 C)  TempSrc: Oral Oral  Oral  SpO2: 94% 94%  93%  Weight:      Height:       Right arm:  Bandage in place is clean dry and intact.  Sling in place.  Distally no pain with passive stretch.  Good 2+ pulse.  She endorses sensation intact in the hand and wrist as well as motor.  Lab Results  Component Value Date   WBC 7.9 10/17/2017   HGB 14.2 10/17/2017   HCT 43.7 10/17/2017   MCV 90.7 10/17/2017   PLT 246 10/17/2017   BMET    Component Value Date/Time   NA 141 10/19/2017 0515   K 4.6 10/19/2017 0515   CL 108 10/19/2017 0515   CO2 23 10/19/2017 0515   GLUCOSE 125 (H) 10/19/2017 0515   BUN 18 10/19/2017 0515   CREATININE 0.98 10/19/2017 0515   CALCIUM 9.5 10/19/2017 0515   GFRNONAA >60 10/19/2017 0515   GFRAA >60 10/19/2017 0515     Assessment/Plan: 1 Day Post-Op   Principal Problem:   Right rotator cuff tear Active Problems:   Complete rotator cuff tear   Advance diet -Okay for discharge home today.  Sling at all times.   Nicholes Stairs 10/19/2017, 10:14 AM   Geralynn Rile, MD [phone]

## 2017-10-21 ENCOUNTER — Encounter (HOSPITAL_COMMUNITY): Payer: Self-pay | Admitting: Specialist

## 2017-10-21 NOTE — Op Note (Signed)
NAME:  Shari, [name]                   ACCOUNT NO.:  MEDICAL RECORD NO.:  [id number]  LOCATION:                                 FACILITY:  PHYSICIAN:  Susa Day, M.D.         DATE OF BIRTH:  DATE OF PROCEDURE:  10/18/2017 DATE OF DISCHARGE:                              OPERATIVE REPORT   PREOPERATIVE DIAGNOSIS:  Rotator cuff tear of the right shoulder, impingement syndrome.  POSTOPERATIVE DIAGNOSIS:  Rotator cuff tear of the right shoulder, impingement syndrome, labral tear.  PROCEDURES PERFORMED: 1. Right shoulder arthroscopy. 2. Subacromial decompression with acromioplasty. 3. Bursectomy. 4. Debridement of labral tear. 5. Mini open rotator cuff repair utilizing SwiveLock suture anchors.  ANESTHESIA:  General.  ASSISTANT:  Lacie Draft, PA.  HISTORY:  This is a 62 year old pleasant female with bilateral rotator cuff tears, right greater than left; retracted tear of the rotator cuff, indicated for repair and arthroscopically assisted.  Risks and benefits were discussed including bleeding, infection, damage to the neurovascular structures, no change in symptoms, worsening symptoms, DVT, PE, anesthetic complications, etc.  TECHNIQUE:  With the patient in supine beachchair position after the induction of adequate general anesthesia preceded by a right upper extremity block, the right shoulder and upper extremity were prepped and draped in usual sterile fashion.  A surgical marker was utilized to delineate the acromion and AC joint coracoid.  Standard posterolateral and anterolateral portals were utilized with an incision through the skin only with an 11-blade.  Arthroscopic cannula inserted in the subacromial space, triangulating with a lateral portal.  Irrigant was utilized to inflate the joint, 65 mmHg.  After lavage of the joint, inspection revealed full-thickness retracted tear of the rotator cuff, humeral head.  We guided down into the joint. There was  tearing of the labrum, extending it up into the biceps attachment.  I introduced the shaver laterally and debrided the anterior labrum, lavaged the glenohumeral joint.  Performed a bursectomy.  Shaved the anterolateral aspect of the acromion.  I then used a pituitary to mobilize the rotator cuff of the infraspinatus and supraspinatus from its bursal and articular surfaces.  Following this, I converted to a mini open rotator cuff repair.  I removed all instrumentation.  Portals were closed with 4-0 nylon simple sutures.  A 2-cm incision was made over the anterolateral aspect of the acromion.  Subcutaneous tissue was dissected.  Electrocautery was utilized to achieve hemostasis.  Marcaine with epinephrine was infiltrated in the deltoid.  Divided the deltoid in line with the skin incision.  Self-retaining retractor was placed.  Used the 3-mm Kerrison to remove the anterolateral aspect of the acromion. Retracted tear was noted.  I fashioned a trough lateral to the articular surface with a rongeur in the greater tuberosity.  The biceps tendon was intact into its groove.  The subscap attachment was intact as well. Extensive mobilization of the cuff, I felt I could mobilize the infraspinatus anterolaterally and the supraspinatus anterolaterally as well.  Two SwiveLocks were placed in the bed at the greater tuberosity just lateral to the articular surface posteriorly and anteriorly separated by 1.5 cm to 2 cm.  We  used an awl and inserted SwiveLocks in both, excellent resistance to pull out, used a TigerTape bilaterally and with the Scorpion suture passer, passed the two threads into the infraspinatus and two into the supraspinatus advancing both anterolaterally without undue tension.  This was delivered into the bed, crossed them and then secured them over the greater tuberosity and second SwiveLocks after piliating with an awl and inserting them into the bone excellent resistance to pull out,  not undue tension, arm in the neutral position.  Redundant suture removed.  This generated a full coverage.  I was then reapproximated the Vicryl, reapproximated the supraspinatus over to the subscap laterally.  Copiously irrigated the wound.  No active bleeding.  Good coverage was noted.  I repaired the raphe with #1 Vicryl, subcu with 2 and skin with Prolene.  Sterile dressing applied.  Placed in a sling, extubated without difficulty and transported to the recovery room in satisfactory condition.  The patient tolerated the procedure well.  No complications.  Assistant, Lacie Draft, [place].  Minimal blood loss.     Susa Day, M.D.     Geralynn Rile  D:  10/18/2017  T:  10/19/2017  Job:  [id number]

## 2017-10-21 NOTE — Discharge Summary (Signed)
Patient ID: [name] MRN: [id number] DOB/AGE: 03/22/1956 62 y.o.  Admit date: 10/18/2017 Discharge date: 10/21/2017  Admission Diagnoses:  Principal Problem:   Right rotator cuff tear Active Problems:   Complete rotator cuff tear   Discharge Diagnoses:  Same  Past Medical History:  Diagnosis Date  . Alopecia   . Herpes zoster 1992   posterior thorax  . Hyperlipidemia 2006    LDL 104, HDL 50  . Hypertension   . OA (osteoarthritis) of knee    Bilateral  . Other abnormal glucose   . Ovarian cyst    Small  . Pre-diabetes   . Uterine fibroid   . Vitamin D deficiency     Surgeries: Procedure(s): Right shoulder arthroscopy, subacromial decompression, labrial debridement mini open rotator cuff repair on 10/18/2017   Consultants:   Discharged Condition: Improved  Hospital Course: [name] is an 62 y.o. female who was admitted 10/18/2017 for operative treatment ofRight rotator cuff tear. Patient has severe unremitting pain that affects sleep, daily activities, and work/hobbies. After pre-op clearance the patient was taken to the operating room on 10/18/2017 and underwent  Procedure(s): Right shoulder arthroscopy, subacromial decompression, labrial debridement mini open rotator cuff repair.    Patient was given perioperative antibiotics:  Anti-infectives (From admission, onward)   Start     Dose/Rate Route Frequency Ordered Stop   10/18/17 2000  ceFAZolin (ANCEF) IVPB 2g/100 mL premix     2 g 200 mL/hr over 30 Minutes Intravenous Every 6 hours 10/18/17 1650 10/19/17 0247   10/18/17 0600  ceFAZolin (ANCEF) 3 g in dextrose 5 % 50 mL IVPB     3 g 130 mL/hr over 30 Minutes Intravenous On call to O.R. 10/17/17 1126 10/18/17 1400   10/18/17 0000  polymyxin B 500,000 Units, bacitracin 50,000 Units in sodium chloride 0.9 % 500 mL irrigation  Status:  Discontinued       As needed 10/18/17 1432 10/18/17 1514       Patient was given sequential compression devices, early  ambulation, and chemoprophylaxis to prevent DVT.  Patient benefited maximally from hospital stay and there were no complications.    Recent vital signs: No data found.   Recent laboratory studies:  Recent Labs    10/19/17 0515  NA 141  K 4.6  CL 108  CO2 23  BUN 18  CREATININE 0.98  GLUCOSE 125*  CALCIUM 9.5     Discharge Medications:   Allergies as of 10/19/2017      Reactions   Benazepril Hcl    REACTION: angioedema of lip. She cannot take angiotensin receptor blockers because of this history.      Medication List    TAKE these medications   amLODipine 5 MG tablet Commonly known as:  NORVASC TAKE 1 TABLET BY MOUTH ONCE DAILY   aspirin 81 MG tablet Take 81 mg by mouth daily.   Biotin 5000 MCG Caps Take 1 capsule by mouth daily.   docusate sodium 100 MG capsule Commonly known as:  COLACE Take 1 capsule (100 mg total) by mouth 2 (two) times daily.   loratadine 10 MG tablet Commonly known as:  CLARITIN Take 10 mg by mouth daily.   methocarbamol 500 MG tablet Commonly known as:  ROBAXIN Take 1 tablet (500 mg total) by mouth every 6 (six) hours as needed for muscle spasms.   metoprolol succinate 100 MG 24 hr tablet Commonly known as:  TOPROL-XL TAKE 1 TABLET BY MOUTH ONCE DAILY   oxyCODONE-acetaminophen 5-325 MG  tablet Commonly known as:  PERCOCET Take 1-2 tablets by mouth every 6 (six) hours as needed for severe pain.   Vitamin D-3 1000 units Caps Take 1 capsule by mouth daily.       Diagnostic Studies: No results found.  Disposition:   Discharge Instructions    Call MD / Call 911   Complete by:  As directed    If you experience chest pain or shortness of breath, CALL 911 and be transported to the hospital emergency room.  If you develope a fever above 101 F, pus (white drainage) or increased drainage or redness at the wound, or calf pain, call your surgeon's office.   Call MD / Call 911   Complete by:  As directed    If you experience chest  pain or shortness of breath, CALL 911 and be transported to the hospital emergency room.  If you develope a fever above 101 F, pus (white drainage) or increased drainage or redness at the wound, or calf pain, call your surgeon's office.   Constipation Prevention   Complete by:  As directed    Drink plenty of fluids.  Prune juice may be helpful.  You may use a stool softener, such as Colace (over the counter) 100 mg twice a day.  Use MiraLax (over the counter) for constipation as needed.   Constipation Prevention   Complete by:  As directed    Drink plenty of fluids.  Prune juice may be helpful.  You may use a stool softener, such as Colace (over the counter) 100 mg twice a day.  Use MiraLax (over the counter) for constipation as needed.   Diet - low sodium heart healthy   Complete by:  As directed    Diet - low sodium heart healthy   Complete by:  As directed    Increase activity slowly as tolerated   Complete by:  As directed    Increase activity slowly as tolerated   Complete by:  As directed       Follow-up Information    Susa Day, MD Follow up in 2 week(s).   Specialty:  Orthopedic Surgery Contact information: [address] [place] [address] [phone]            Signed: Cecilie Kicks. 10/21/2017, 3:17 PM

## 2017-10-22 NOTE — Progress Notes (Signed)
Subjective:    Patient ID: [name], female    DOB: 1955-08-13, 62 y.o.   MRN: [id number]  HPI The patient is here for follow up.  Hypertension: She is taking her medication daily. She is compliant with a low sodium diet.  She denies chest pain, palpitations, edema, shortness of breath and regular headaches. She is not exercising regularly.  She does not monitor her blood pressure at home.    Prediabetes:  She is somewhatcompliant with a low sugar/carbohydrate diet.  She is not exercising regularly.   Medications and allergies reviewed with patient and updated if appropriate.  Patient Active Problem List   Diagnosis Date Noted  . Right rotator cuff tear 10/18/2017  . Complete rotator cuff tear 10/18/2017  . Chronic pain of both shoulders 04/17/2017  . Bilateral primary osteoarthritis of knee 04/11/2016  . Alopecia areata 09/24/2013  . Unspecified vitamin D deficiency 10/01/2012  . Pre-diabetes 03/24/2009  . Essential hypertension 02/26/2008    Current Outpatient Medications on File Prior to Visit  Medication Sig Dispense Refill  . amLODipine (NORVASC) 5 MG tablet TAKE 1 TABLET BY MOUTH ONCE DAILY 90 tablet 1  . aspirin 81 MG tablet Take 81 mg by mouth daily.    . Biotin 5000 MCG CAPS Take 1 capsule by mouth daily.     . Cholecalciferol (VITAMIN D-3) 1000 UNITS CAPS Take 1 capsule by mouth daily.    Marland Kitchen docusate sodium (COLACE) 100 MG capsule Take 1 capsule (100 mg total) by mouth 2 (two) times daily. 40 capsule 0  . loratadine (CLARITIN) 10 MG tablet Take 10 mg by mouth daily.    . methocarbamol (ROBAXIN) 500 MG tablet Take 1 tablet (500 mg total) by mouth every 6 (six) hours as needed for muscle spasms. 40 tablet 1  . metoprolol succinate (TOPROL-XL) 100 MG 24 hr tablet TAKE 1 TABLET BY MOUTH ONCE DAILY 90 tablet 1  . oxyCODONE-acetaminophen (PERCOCET) 5-325 MG tablet Take 1-2 tablets by mouth every 6 (six) hours as needed for severe pain. 40 tablet 0   No current  facility-administered medications on file prior to visit.     Past Medical History:  Diagnosis Date  . Alopecia   . Herpes zoster 1992   posterior thorax  . Hyperlipidemia 2006    LDL 104, HDL 50  . Hypertension   . OA (osteoarthritis) of knee    Bilateral  . Other abnormal glucose   . Ovarian cyst    Small  . Pre-diabetes   . Uterine fibroid   . Vitamin D deficiency     Past Surgical History:  Procedure Laterality Date  . COLONOSCOPY  2007, 07/25/2016   negative  . CYSTECTOMY     peri rectal  . FOOT SURGERY     bilaterally  . INCONTINENCE SURGERY  2005   Dr Quincy Simmonds  . LIPOMA EXCISION  2011    L chest , Dr Georgette Dover  . SHOULDER ARTHROSCOPY WITH ROTATOR CUFF REPAIR AND SUBACROMIAL DECOMPRESSION Right 10/18/2017   Procedure: Right shoulder arthroscopy, subacromial decompression, labrial debridement mini open rotator cuff repair;  Surgeon: Susa Day, MD;  Location: [facility];  Service: Orthopedics;  Laterality: Right;  120 mins    Social History   Socioeconomic History  . Marital status: Single    Spouse name: Not on file  . Number of children: Not on file  . Years of education: Not on file  . Highest education level: Not on file  Occupational History  .  Not on file  Social Needs  . Financial resource strain: Not on file  . Food insecurity:    Worry: Not on file    Inability: Not on file  . Transportation needs:    Medical: Not on file    Non-medical: Not on file  Tobacco Use  . Smoking status: Former Smoker    Last attempt to quit: 06/25/1980    Years since quitting: 37.3  . Smokeless tobacco: Never Used  . Tobacco comment: smoked [age] , up to 1/2 ppd  Substance and Sexual Activity  . Alcohol use: Not Currently  . Drug use: No  . Sexual activity: Yes    Birth control/protection: Post-menopausal  Lifestyle  . Physical activity:    Days per week: Not on file    Minutes per session: Not on file  . Stress: Not on file  Relationships  . Social connections:     Talks on phone: Not on file    Gets together: Not on file    Attends religious service: Not on file    Active member of club or organization: Not on file    Attends meetings of clubs or organizations: Not on file    Relationship status: Not on file  Other Topics Concern  . Not on file  Social History Narrative   No regular exercise    Family History  Problem Relation Age of Onset  . Diabetes Mother   . Stroke Father 95  . Hypertension Father   . Hypertension Sister   . Hypertension Maternal Aunt   . Diabetes Maternal Aunt   . Cancer Neg Hx   . Colon cancer Neg Hx   . Breast cancer Neg Hx     Review of Systems  Constitutional: Negative for chills and fever.  Respiratory: Negative for cough, shortness of breath and wheezing.   Cardiovascular: Negative for chest pain, palpitations and leg swelling.  Neurological: Negative for light-headedness and headaches.       Objective:   Vitals:   10/23/17 1024  BP: 128/86  Pulse: 74  Resp: 16  Temp: 98.4 F (36.9 C)  SpO2: 97%   BP Readings from Last 3 Encounters:  10/23/17 128/86  10/19/17 (!) 147/84  10/17/17 (!) 141/83   Wt Readings from Last 3 Encounters:  10/23/17 258 lb (117 kg)  10/18/17 259 lb 14.8 oz (117.9 kg)  10/17/17 260 lb (117.9 kg)   Body mass index is 43.6 kg/m.   Physical Exam    Constitutional: Appears well-developed and well-nourished. No distress.  HENT:  Head: Normocephalic and atraumatic.  Neck: Neck supple. No tracheal deviation present. No thyromegaly present.  No cervical lymphadenopathy Cardiovascular: Normal rate, regular rhythm and normal heart sounds.   No murmur heard. No carotid bruit .  No edema Pulmonary/Chest: Effort normal and breath sounds normal. No respiratory distress. No has no wheezes. No rales.  Skin: Skin is warm and dry. Not diaphoretic.  Psychiatric: Normal mood and affect. Behavior is normal.      Assessment & Plan:    See Problem List for Assessment and Plan  of chronic medical problems.

## 2017-10-22 NOTE — Patient Instructions (Addendum)
  Medications reviewed and updated.  No changes recommended at this time.   Please followup in one year   

## 2017-10-23 ENCOUNTER — Ambulatory Visit: Payer: Federal, State, Local not specified - PPO | Admitting: Internal Medicine

## 2017-10-23 ENCOUNTER — Encounter: Payer: Self-pay | Admitting: Internal Medicine

## 2017-10-23 VITALS — BP 128/86 | HR 74 | Temp 98.4°F | Resp 16 | Wt 258.0 lb

## 2017-10-23 DIAGNOSIS — I10 Essential (primary) hypertension: Secondary | ICD-10-CM

## 2017-10-23 DIAGNOSIS — R7303 Prediabetes: Secondary | ICD-10-CM | POA: Diagnosis not present

## 2017-10-23 NOTE — Assessment & Plan Note (Signed)
BP well controlled Current regimen effective and well tolerated Continue current medications at current doses Last cmp reviewed 

## 2017-10-23 NOTE — Assessment & Plan Note (Signed)
Lab Results  Component Value Date   HGBA1C 5.9 (H) 10/17/2017   a1c improved  continue low sugar/carb diet Encouraged regular exercise

## 2017-11-03 ENCOUNTER — Other Ambulatory Visit: Payer: Self-pay | Admitting: Internal Medicine

## 2017-11-03 DIAGNOSIS — I10 Essential (primary) hypertension: Secondary | ICD-10-CM

## 2017-11-05 DIAGNOSIS — M25511 Pain in right shoulder: Secondary | ICD-10-CM | POA: Diagnosis not present

## 2017-11-12 DIAGNOSIS — M25511 Pain in right shoulder: Secondary | ICD-10-CM | POA: Diagnosis not present

## 2017-11-20 DIAGNOSIS — M25511 Pain in right shoulder: Secondary | ICD-10-CM | POA: Diagnosis not present

## 2017-11-26 DIAGNOSIS — M25511 Pain in right shoulder: Secondary | ICD-10-CM | POA: Diagnosis not present

## 2017-12-04 DIAGNOSIS — M25511 Pain in right shoulder: Secondary | ICD-10-CM | POA: Diagnosis not present

## 2017-12-10 DIAGNOSIS — M25511 Pain in right shoulder: Secondary | ICD-10-CM | POA: Diagnosis not present

## 2017-12-19 DIAGNOSIS — M25511 Pain in right shoulder: Secondary | ICD-10-CM | POA: Diagnosis not present

## 2017-12-24 DIAGNOSIS — M25511 Pain in right shoulder: Secondary | ICD-10-CM | POA: Diagnosis not present

## 2018-01-01 DIAGNOSIS — M25511 Pain in right shoulder: Secondary | ICD-10-CM | POA: Diagnosis not present

## 2018-01-08 DIAGNOSIS — M25511 Pain in right shoulder: Secondary | ICD-10-CM | POA: Diagnosis not present

## 2018-01-15 DIAGNOSIS — M25511 Pain in right shoulder: Secondary | ICD-10-CM | POA: Diagnosis not present

## 2018-01-22 DIAGNOSIS — M25511 Pain in right shoulder: Secondary | ICD-10-CM | POA: Diagnosis not present

## 2018-01-23 ENCOUNTER — Other Ambulatory Visit: Payer: Self-pay | Admitting: Internal Medicine

## 2018-01-23 DIAGNOSIS — Z1231 Encounter for screening mammogram for malignant neoplasm of breast: Secondary | ICD-10-CM

## 2018-02-26 ENCOUNTER — Ambulatory Visit
Admission: RE | Admit: 2018-02-26 | Discharge: 2018-02-26 | Disposition: A | Discharge status: Home / Self Care | Payer: Federal, State, Local not specified - PPO | Source: Ambulatory Visit | Attending: Internal Medicine | Admitting: Internal Medicine

## 2018-02-26 DIAGNOSIS — Z1231 Encounter for screening mammogram for malignant neoplasm of breast: Secondary | ICD-10-CM | POA: Diagnosis not present

## 2018-06-06 ENCOUNTER — Ambulatory Visit: Payer: Self-pay | Admitting: Orthopedic Surgery

## 2018-06-20 NOTE — Patient Instructions (Signed)
[name]  06/20/2018   Your procedure is scheduled on: 07-02-18    Report to [facility] Main  Entrance     Report to admitting at 6:30AM    Call this number if you have problems the morning of surgery [phone]      Remember: Do not eat food or drink liquids :After Midnight. BRUSH YOUR TEETH MORNING OF SURGERY AND RINSE YOUR MOUTH OUT, NO CHEWING GUM CANDY OR MINTS.     Take these medicines the morning of surgery with A SIP OF WATER: Metoprolol, Amlodipine, Claritin                                  You may not have any metal on your body including hair pins and              piercings  Do not wear jewelry, make-up, lotions, powders or perfumes, deodorant             Do not wear nail polish.  Do not shave  48 hours prior to surgery.          Do not bring valuables to the hospital. [place].  Contacts, dentures or bridgework may not be worn into surgery.  Leave suitcase in the car. After surgery it may be brought to your room.                   Please read over the following fact sheets you were given: _____________________________________________________________________             [facility] - Preparing for Surgery Before surgery, you can play an important role.  Because skin is not sterile, your skin needs to be as free of germs as possible.  You can reduce the number of germs on your skin by washing with CHG (chlorahexidine gluconate) soap before surgery.  CHG is an antiseptic cleaner which kills germs and bonds with the skin to continue killing germs even after washing. Please DO NOT use if you have an allergy to CHG or antibacterial soaps.  If your skin becomes reddened/irritated stop using the CHG and inform your nurse when you arrive at Short Stay. Do not shave (including legs and underarms) for at least 48 hours prior to the first CHG shower.  You may shave your face/neck. Please  follow these instructions carefully:  1.  Shower with CHG Soap the night before surgery and the  morning of Surgery.  2.  If you choose to wash your hair, wash your hair first as usual with your  normal  shampoo.  3.  After you shampoo, rinse your hair and body thoroughly to remove the  shampoo.                           4.  Use CHG as you would any other liquid soap.  You can apply chg directly  to the skin and wash                       Gently with a scrungie or clean washcloth.  5.  Apply the CHG Soap to your body ONLY FROM THE NECK DOWN.  Do not use on face/ open                           Wound or open sores. Avoid contact with eyes, ears mouth and genitals (private parts).                       Wash face,  Genitals (private parts) with your normal soap.             6.  Wash thoroughly, paying special attention to the area where your surgery  will be performed.  7.  Thoroughly rinse your body with warm water from the neck down.  8.  DO NOT shower/wash with your normal soap after using and rinsing off  the CHG Soap.                9.  Pat yourself dry with a clean towel.            10.  Wear clean pajamas.            11.  Place clean sheets on your bed the night of your first shower and do not  sleep with pets. Day of Surgery : Do not apply any lotions/deodorants the morning of surgery.  Please wear clean clothes to the hospital/surgery center.  FAILURE TO FOLLOW THESE INSTRUCTIONS MAY RESULT IN THE CANCELLATION OF YOUR SURGERY PATIENT SIGNATURE_________________________________  NURSE SIGNATURE__________________________________  ________________________________________________________________________   Adam Phenix  An incentive spirometer is a tool that can help keep your lungs clear and active. This tool measures how well you are filling your lungs with each breath. Taking long deep breaths may help reverse or decrease the chance of developing breathing (pulmonary) problems  (especially infection) following:  A long period of time when you are unable to move or be active. BEFORE THE PROCEDURE   If the spirometer includes an indicator to show your best effort, your nurse or respiratory therapist will set it to a desired goal.  If possible, sit up straight or lean slightly forward. Try not to slouch.  Hold the incentive spirometer in an upright position. INSTRUCTIONS FOR USE  1. Sit on the edge of your bed if possible, or sit up as far as you can in bed or on a chair. 2. Hold the incentive spirometer in an upright position. 3. Breathe out normally. 4. Place the mouthpiece in your mouth and seal your lips tightly around it. 5. Breathe in slowly and as deeply as possible, raising the piston or the ball toward the top of the column. 6. Hold your breath for 3-5 seconds or for as long as possible. Allow the piston or ball to fall to the bottom of the column. 7. Remove the mouthpiece from your mouth and breathe out normally. 8. Rest for a few seconds and repeat Steps 1 through 7 at least 10 times every 1-2 hours when you are awake. Take your time and take a few normal breaths between deep breaths. 9. The spirometer may include an indicator to show your best effort. Use the indicator as a goal to work toward during each repetition. 10. After each set of 10 deep breaths, practice coughing to be sure your lungs are clear. If you have an incision (the cut made at the time of surgery), support your incision when coughing by placing a pillow or rolled up towels firmly against it. Once you are able to get out of  bed, walk around indoors and cough well. You may stop using the incentive spirometer when instructed by your caregiver.  RISKS AND COMPLICATIONS  Take your time so you do not get dizzy or light-headed.  If you are in pain, you may need to take or ask for pain medication before doing incentive spirometry. It is harder to take a deep breath if you are having  pain. AFTER USE  Rest and breathe slowly and easily.  It can be helpful to keep track of a log of your progress. Your caregiver can provide you with a simple table to help with this. If you are using the spirometer at home, follow these instructions: [place] IF:   You are having difficultly using the spirometer.  You have trouble using the spirometer as often as instructed.  Your pain medication is not giving enough relief while using the spirometer.  You develop fever of 100.5 F (38.1 C) or higher. SEEK IMMEDIATE MEDICAL CARE IF:   You cough up bloody sputum that had not been present before.  You develop fever of 102 F (38.9 C) or greater.  You develop worsening pain at or near the incision site. MAKE SURE YOU:   Understand these instructions.  Will watch your condition.  Will get help right away if you are not doing well or get worse. Document Released: 10/22/2006 Document Revised: 09/03/2011 Document Reviewed: 12/23/2006 [facility] Patient Information 2014 [place], [place].   ________________________________________________________________________

## 2018-06-20 NOTE — Progress Notes (Signed)
ekg 10-17-17 epic

## 2018-06-23 ENCOUNTER — Encounter (HOSPITAL_COMMUNITY): Payer: Self-pay

## 2018-06-23 ENCOUNTER — Other Ambulatory Visit: Payer: Self-pay

## 2018-06-23 ENCOUNTER — Encounter (HOSPITAL_COMMUNITY)
Admission: RE | Admit: 2018-06-23 | Discharge: 2018-06-23 | Disposition: A | Discharge status: Home / Self Care | Source: Ambulatory Visit | Attending: Specialist | Admitting: Specialist

## 2018-06-23 DIAGNOSIS — M7542 Impingement syndrome of left shoulder: Secondary | ICD-10-CM | POA: Insufficient documentation

## 2018-06-23 DIAGNOSIS — M25812 Other specified joint disorders, left shoulder: Secondary | ICD-10-CM | POA: Diagnosis not present

## 2018-06-23 DIAGNOSIS — Z01812 Encounter for preprocedural laboratory examination: Secondary | ICD-10-CM | POA: Insufficient documentation

## 2018-06-23 DIAGNOSIS — M75102 Unspecified rotator cuff tear or rupture of left shoulder, not specified as traumatic: Secondary | ICD-10-CM | POA: Diagnosis not present

## 2018-06-23 LAB — COMPREHENSIVE METABOLIC PANEL
ALBUMIN: 3.7 g/dL (ref 3.5–5.0)
ALT: 16 U/L (ref 0–44)
AST: 19 U/L (ref 15–41)
Alkaline Phosphatase: 84 U/L (ref 38–126)
Anion gap: 5 (ref 5–15)
BILIRUBIN TOTAL: 0.3 mg/dL (ref 0.3–1.2)
BUN: 18 mg/dL (ref 8–23)
CHLORIDE: 106 mmol/L (ref 98–111)
CO2: 29 mmol/L (ref 22–32)
Calcium: 9.2 mg/dL (ref 8.9–10.3)
Creatinine, Ser: 1.01 mg/dL — ABNORMAL HIGH (ref 0.44–1.00)
GFR calc Af Amer: >60 mL/min (ref >60)
GFR calc non Af Amer: 60 mL/min — ABNORMAL LOW (ref >60)
GLUCOSE: 86 mg/dL (ref 70–99)
POTASSIUM: 4.1 mmol/L (ref 3.5–5.1)
SODIUM: 140 mmol/L (ref 135–145)
TOTAL PROTEIN: 7.1 g/dL (ref 6.5–8.1)

## 2018-06-23 LAB — CBC
HEMATOCRIT: 43.6 % (ref 36.0–46.0)
Hemoglobin: 13.3 g/dL (ref 12.0–15.0)
MCH: 28.7 pg (ref 26.0–34.0)
MCHC: 30.5 g/dL (ref 30.0–36.0)
MCV: 94.2 fL (ref 80.0–100.0)
Platelets: 239 10*3/uL (ref 150–400)
RBC: 4.63 MIL/uL (ref 3.87–5.11)
RDW: 14 % (ref 11.5–15.5)
WBC: 9.3 10*3/uL (ref 4.0–10.5)
nRBC: 0 % (ref 0.0–0.2)

## 2018-06-23 LAB — HEMOGLOBIN A1C
HEMOGLOBIN A1C: 5.8 % — AB (ref 4.8–5.6)
Mean Plasma Glucose: 119.76 mg/dL

## 2018-06-24 ENCOUNTER — Ambulatory Visit: Payer: Self-pay | Admitting: Orthopedic Surgery

## 2018-06-24 NOTE — H&P (Signed)
[name] is an 62 y.o. female.   Chief Complaint: left shoulder pain HPI: Chronic ongoing L shoulder pain nearly 3+ year duration. Refractory to cortisone and activity modifications. Limiting ADLs and QOL. S/p RCR on Right doing well.  Past Medical History:  Diagnosis Date  . Alopecia   . Herpes zoster 1992   posterior thorax  . Hyperlipidemia 2006    LDL 104, HDL 50  . Hypertension   . OA (osteoarthritis) of knee    Bilateral  . Other abnormal glucose   . Ovarian cyst    Small  . Pre-diabetes   . Uterine fibroid   . Vitamin D deficiency     Past Surgical History:  Procedure Laterality Date  . COLONOSCOPY  2007, 07/25/2016   negative  . CYSTECTOMY     peri rectal  . FOOT SURGERY     bilaterally  . INCONTINENCE SURGERY  2005   Dr Quincy Simmonds  . LIPOMA EXCISION  2011    L chest , Dr Georgette Dover  . SHOULDER ARTHROSCOPY WITH ROTATOR CUFF REPAIR AND SUBACROMIAL DECOMPRESSION Right 10/18/2017   Procedure: Right shoulder arthroscopy, subacromial decompression, labrial debridement mini open rotator cuff repair;  Surgeon: Susa Day, MD;  Location: [facility];  Service: Orthopedics;  Laterality: Right;  120 mins    Family History  Problem Relation Age of Onset  . Diabetes Mother   . Stroke Father 52  . Hypertension Father   . Hypertension Sister   . Hypertension Maternal Aunt   . Diabetes Maternal Aunt   . Cancer Neg Hx   . Colon cancer Neg Hx   . Breast cancer Neg Hx    Social History:  reports that she quit smoking about 38 years ago. She has never used smokeless tobacco. She reports previous alcohol use. She reports that she does not use drugs.  Allergies:  Allergies  Allergen Reactions  . Benazepril Hcl     REACTION: angioedema of lip. She cannot take angiotensin receptor blockers because of this history.    Medications: amLODIPine 5 mg tablet aspirin 81 mg tablet biotin 5 mg capsule cholecalciferol (vitamin D3) 1,000 unit capsule loratadine 10 mg tablet meloxicam  15 mg tablet metoprolol succinate ER 100 mg tablet,extended release 24 hr  Results for orders placed or performed during the hospital encounter of 06/23/18 (from the past 48 hour(s))  Comprehensive metabolic panel     Status: Abnormal   Collection Time: 06/23/18  3:31 PM  Result Value Ref Range   Sodium 140 135 - 145 mmol/L   Potassium 4.1 3.5 - 5.1 mmol/L   Chloride 106 98 - 111 mmol/L   CO2 29 22 - 32 mmol/L   Glucose, Bld 86 70 - 99 mg/dL   BUN 18 8 - 23 mg/dL   Creatinine, Ser 1.01 (H) 0.44 - 1.00 mg/dL   Calcium 9.2 8.9 - 10.3 mg/dL   Total Protein 7.1 6.5 - 8.1 g/dL   Albumin 3.7 3.5 - 5.0 g/dL   AST 19 15 - 41 U/L   ALT 16 0 - 44 U/L   Alkaline Phosphatase 84 38 - 126 U/L   Total Bilirubin 0.3 0.3 - 1.2 mg/dL   GFR calc non Af Amer 60 (L) >60 mL/min   GFR calc Af Amer >60 >60 mL/min   Anion gap 5 5 - 15    Comment: Performed at [facility], [place] [address] [place], [place] [address]  CBC     Status: None  Collection Time: 06/23/18  3:31 PM  Result Value Ref Range   WBC 9.3 4.0 - 10.5 K/uL   RBC 4.63 3.87 - 5.11 MIL/uL   Hemoglobin 13.3 12.0 - 15.0 g/dL   HCT 43.6 36.0 - 46.0 %   MCV 94.2 80.0 - 100.0 fL   MCH 28.7 26.0 - 34.0 pg   MCHC 30.5 30.0 - 36.0 g/dL   RDW 14.0 11.5 - 15.5 %   Platelets 239 150 - 400 K/uL   nRBC 0.0 0.0 - 0.2 %    Comment: Performed at [facility], [place] [address] [place], [place] [address]  Hemoglobin A1c     Status: Abnormal   Collection Time: 06/23/18  3:31 PM  Result Value Ref Range   Hgb A1c MFr Bld 5.8 (H) 4.8 - 5.6 %    Comment: (NOTE) Pre diabetes:          5.7%-6.4% Diabetes:              >6.4% Glycemic control for   <7.0% adults with diabetes    Mean Plasma Glucose 119.76 mg/dL    Comment: Performed at [place] [address] [place], [place] [address]   No results found.  Review of Systems  Constitutional: Negative.   HENT: Negative.   Eyes: Negative.   Respiratory:  Negative.   Cardiovascular: Negative.   Gastrointestinal: Negative.   Genitourinary: Negative.   Musculoskeletal: Positive for joint pain.  Skin: Negative.   Neurological: Negative.   Psychiatric/Behavioral: Negative.     There were no vitals taken for this visit. Physical Exam  Constitutional: She is oriented to person, place, and time. She appears well-developed.  HENT:  Head: Normocephalic.  Eyes: Pupils are equal, round, and reactive to light.  Neck: Normal range of motion.  Cardiovascular: Normal rate.  Respiratory: Effort normal.  GI: Soft.  Neurological: She is alert and oriented to person, place, and time.  Left shoulder positive impingement sign positive secondary impingement sign.  Nontender over this AC joint.  Full range of motion cervical spine. Negative speed sign on the left.  Skin: Skin is warm and dry.     Assessment/Plan Dx: L shoulder RCT  she has pain on the left side and a rotator cuff tear in the left side.  This point we will continue form of supervised physical therapy will see her back in a month.  We discussed a rotator cuff repair on the left.  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  Plan Left shoulder scope, SAD, mini-open RCR, possible patch graft   Cecilie Kicks., PA-C  For Dr. Tonita Cong 06/24/2018, 10:31 AM

## 2018-06-24 NOTE — H&P (View-Only) (Signed)
[name] is an 62 y.o. female.   Chief Complaint: left shoulder pain HPI: Chronic ongoing L shoulder pain nearly 3+ year duration. Refractory to cortisone and activity modifications. Limiting ADLs and QOL. S/p RCR on Right doing well.  Past Medical History:  Diagnosis Date  . Alopecia   . Herpes zoster 1992   posterior thorax  . Hyperlipidemia 2006    LDL 104, HDL 50  . Hypertension   . OA (osteoarthritis) of knee    Bilateral  . Other abnormal glucose   . Ovarian cyst    Small  . Pre-diabetes   . Uterine fibroid   . Vitamin D deficiency     Past Surgical History:  Procedure Laterality Date  . COLONOSCOPY  2007, 07/25/2016   negative  . CYSTECTOMY     peri rectal  . FOOT SURGERY     bilaterally  . INCONTINENCE SURGERY  2005   Dr Quincy Simmonds  . LIPOMA EXCISION  2011    L chest , Dr Georgette Dover  . SHOULDER ARTHROSCOPY WITH ROTATOR CUFF REPAIR AND SUBACROMIAL DECOMPRESSION Right 10/18/2017   Procedure: Right shoulder arthroscopy, subacromial decompression, labrial debridement mini open rotator cuff repair;  Surgeon: Susa Day, MD;  Location: [facility];  Service: Orthopedics;  Laterality: Right;  120 mins    Family History  Problem Relation Age of Onset  . Diabetes Mother   . Stroke Father 59  . Hypertension Father   . Hypertension Sister   . Hypertension Maternal Aunt   . Diabetes Maternal Aunt   . Cancer Neg Hx   . Colon cancer Neg Hx   . Breast cancer Neg Hx    Social History:  reports that she quit smoking about 38 years ago. She has never used smokeless tobacco. She reports previous alcohol use. She reports that she does not use drugs.  Allergies:  Allergies  Allergen Reactions  . Benazepril Hcl     REACTION: angioedema of lip. She cannot take angiotensin receptor blockers because of this history.    Medications: amLODIPine 5 mg tablet aspirin 81 mg tablet biotin 5 mg capsule cholecalciferol (vitamin D3) 1,000 unit capsule loratadine 10 mg tablet meloxicam  15 mg tablet metoprolol succinate ER 100 mg tablet,extended release 24 hr  Results for orders placed or performed during the hospital encounter of 06/23/18 (from the past 48 hour(s))  Comprehensive metabolic panel     Status: Abnormal   Collection Time: 06/23/18  3:31 PM  Result Value Ref Range   Sodium 140 135 - 145 mmol/L   Potassium 4.1 3.5 - 5.1 mmol/L   Chloride 106 98 - 111 mmol/L   CO2 29 22 - 32 mmol/L   Glucose, Bld 86 70 - 99 mg/dL   BUN 18 8 - 23 mg/dL   Creatinine, Ser 1.01 (H) 0.44 - 1.00 mg/dL   Calcium 9.2 8.9 - 10.3 mg/dL   Total Protein 7.1 6.5 - 8.1 g/dL   Albumin 3.7 3.5 - 5.0 g/dL   AST 19 15 - 41 U/L   ALT 16 0 - 44 U/L   Alkaline Phosphatase 84 38 - 126 U/L   Total Bilirubin 0.3 0.3 - 1.2 mg/dL   GFR calc non Af Amer 60 (L) >60 mL/min   GFR calc Af Amer >60 >60 mL/min   Anion gap 5 5 - 15    Comment: Performed at [facility], [place] [address] [place], [place] [address]  CBC     Status: None  Collection Time: 06/23/18  3:31 PM  Result Value Ref Range   WBC 9.3 4.0 - 10.5 K/uL   RBC 4.63 3.87 - 5.11 MIL/uL   Hemoglobin 13.3 12.0 - 15.0 g/dL   HCT 43.6 36.0 - 46.0 %   MCV 94.2 80.0 - 100.0 fL   MCH 28.7 26.0 - 34.0 pg   MCHC 30.5 30.0 - 36.0 g/dL   RDW 14.0 11.5 - 15.5 %   Platelets 239 150 - 400 K/uL   nRBC 0.0 0.0 - 0.2 %    Comment: Performed at [facility], [place] [address] [place], [place] [address]  Hemoglobin A1c     Status: Abnormal   Collection Time: 06/23/18  3:31 PM  Result Value Ref Range   Hgb A1c MFr Bld 5.8 (H) 4.8 - 5.6 %    Comment: (NOTE) Pre diabetes:          5.7%-6.4% Diabetes:              >6.4% Glycemic control for   <7.0% adults with diabetes    Mean Plasma Glucose 119.76 mg/dL    Comment: Performed at [place] [address], [place], [place] [address]   No results found.  Review of Systems  Constitutional: Negative.   HENT: Negative.   Eyes: Negative.   Respiratory:  Negative.   Cardiovascular: Negative.   Gastrointestinal: Negative.   Genitourinary: Negative.   Musculoskeletal: Positive for joint pain.  Skin: Negative.   Neurological: Negative.   Psychiatric/Behavioral: Negative.     There were no vitals taken for this visit. Physical Exam  Constitutional: She is oriented to person, place, and time. She appears well-developed.  HENT:  Head: Normocephalic.  Eyes: Pupils are equal, round, and reactive to light.  Neck: Normal range of motion.  Cardiovascular: Normal rate.  Respiratory: Effort normal.  GI: Soft.  Neurological: She is alert and oriented to person, place, and time.  Left shoulder positive impingement sign positive secondary impingement sign.  Nontender over this AC joint.  Full range of motion cervical spine. Negative speed sign on the left.  Skin: Skin is warm and dry.     Assessment/Plan Dx: L shoulder RCT  she has pain on the left side and a rotator cuff tear in the left side.  This point we will continue form of supervised physical therapy will see her back in a month.  We discussed a rotator cuff repair on the left.  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  Plan Left shoulder scope, SAD, mini-open RCR, possible patch graft   Cecilie Kicks., PA-C  For Dr. Tonita Cong 06/24/2018, 10:31 AM

## 2018-07-01 MED ORDER — DEXTROSE 5 % IV SOLN
3.0000 g | INTRAVENOUS | Status: AC
  Administered 2018-07-02: 3 g via INTRAVENOUS
  Filled 2018-07-01: qty 3

## 2018-07-02 ENCOUNTER — Encounter (HOSPITAL_COMMUNITY): Payer: Self-pay | Admitting: Certified Registered Nurse Anesthetist

## 2018-07-02 ENCOUNTER — Ambulatory Visit (HOSPITAL_COMMUNITY): Admitting: Physician Assistant

## 2018-07-02 ENCOUNTER — Other Ambulatory Visit: Payer: Self-pay

## 2018-07-02 ENCOUNTER — Ambulatory Visit (HOSPITAL_COMMUNITY): Admitting: Certified Registered Nurse Anesthetist

## 2018-07-02 ENCOUNTER — Encounter (HOSPITAL_COMMUNITY)
Admission: RE | Disposition: A | Discharge status: Home / Self Care | Payer: Self-pay | Source: Home / Self Care | Attending: Specialist

## 2018-07-02 ENCOUNTER — Ambulatory Visit (HOSPITAL_COMMUNITY)
Admission: RE | Admit: 2018-07-02 | Discharge: 2018-07-03 | Disposition: A | Discharge status: Home / Self Care | Attending: Specialist | Admitting: Specialist

## 2018-07-02 DIAGNOSIS — M7542 Impingement syndrome of left shoulder: Secondary | ICD-10-CM | POA: Insufficient documentation

## 2018-07-02 DIAGNOSIS — R7303 Prediabetes: Secondary | ICD-10-CM | POA: Insufficient documentation

## 2018-07-02 DIAGNOSIS — Z87891 Personal history of nicotine dependence: Secondary | ICD-10-CM | POA: Insufficient documentation

## 2018-07-02 DIAGNOSIS — M75120 Complete rotator cuff tear or rupture of unspecified shoulder, not specified as traumatic: Secondary | ICD-10-CM | POA: Diagnosis present

## 2018-07-02 DIAGNOSIS — Z7982 Long term (current) use of aspirin: Secondary | ICD-10-CM | POA: Insufficient documentation

## 2018-07-02 DIAGNOSIS — I10 Essential (primary) hypertension: Secondary | ICD-10-CM | POA: Diagnosis not present

## 2018-07-02 DIAGNOSIS — M75102 Unspecified rotator cuff tear or rupture of left shoulder, not specified as traumatic: Secondary | ICD-10-CM | POA: Diagnosis not present

## 2018-07-02 DIAGNOSIS — Z6841 Body Mass Index (BMI) 40.0 and over, adult: Secondary | ICD-10-CM | POA: Diagnosis not present

## 2018-07-02 DIAGNOSIS — E559 Vitamin D deficiency, unspecified: Secondary | ICD-10-CM | POA: Insufficient documentation

## 2018-07-02 DIAGNOSIS — M19011 Primary osteoarthritis, right shoulder: Secondary | ICD-10-CM | POA: Insufficient documentation

## 2018-07-02 DIAGNOSIS — Z791 Long term (current) use of non-steroidal anti-inflammatories (NSAID): Secondary | ICD-10-CM | POA: Insufficient documentation

## 2018-07-02 DIAGNOSIS — Z888 Allergy status to other drugs, medicaments and biological substances status: Secondary | ICD-10-CM | POA: Insufficient documentation

## 2018-07-02 DIAGNOSIS — Z79899 Other long term (current) drug therapy: Secondary | ICD-10-CM | POA: Insufficient documentation

## 2018-07-02 HISTORY — PX: SHOULDER ARTHROSCOPY WITH ROTATOR CUFF REPAIR AND SUBACROMIAL DECOMPRESSION: SHX5686

## 2018-07-02 SURGERY — SHOULDER ARTHROSCOPY WITH ROTATOR CUFF REPAIR AND SUBACROMIAL DECOMPRESSION
Anesthesia: Regional | Site: Shoulder | Laterality: Left

## 2018-07-02 MED ORDER — LACTATED RINGERS IR SOLN
Status: DC | PRN
  Administered 2018-07-02: 6000 mL

## 2018-07-02 MED ORDER — BIOTIN 5000 MCG PO CAPS: 1.0000 | ORAL_CAPSULE | Freq: Every day | ORAL | Status: DC

## 2018-07-02 MED ORDER — OXYCODONE HCL 5 MG PO TABS: 5.0000 mg | ORAL_TABLET | ORAL | 0 refills | Status: DC | PRN

## 2018-07-02 MED ORDER — METOPROLOL SUCCINATE ER 50 MG PO TB24
100.0000 mg | ORAL_TABLET | Freq: Every day | ORAL | Status: DC
  Administered 2018-07-03: 100 mg via ORAL
  Filled 2018-07-02: qty 2

## 2018-07-02 MED ORDER — KCL IN DEXTROSE-NACL 20-5-0.45 MEQ/L-%-% IV SOLN
INTRAVENOUS | Status: DC
  Administered 2018-07-02: 50 mL/h via INTRAVENOUS
  Filled 2018-07-02: qty 1000

## 2018-07-02 MED ORDER — BUPIVACAINE HCL (PF) 0.5 % IJ SOLN
INTRAMUSCULAR | Status: DC | PRN
  Administered 2018-07-02: 20 mL via PERINEURAL

## 2018-07-02 MED ORDER — ROCURONIUM BROMIDE 10 MG/ML (PF) SYRINGE
PREFILLED_SYRINGE | INTRAVENOUS | Status: AC
  Filled 2018-07-02: qty 10

## 2018-07-02 MED ORDER — BUPIVACAINE LIPOSOME 1.3 % IJ SUSP
INTRAMUSCULAR | Status: DC | PRN
  Administered 2018-07-02: 10 mL via PERINEURAL

## 2018-07-02 MED ORDER — PROMETHAZINE HCL 25 MG/ML IJ SOLN: 6.2500 mg | INTRAMUSCULAR | Status: DC | PRN

## 2018-07-02 MED ORDER — BUPIVACAINE-EPINEPHRINE (PF) 0.25% -1:200000 IJ SOLN
INTRAMUSCULAR | Status: DC | PRN
  Administered 2018-07-02: 20 mL

## 2018-07-02 MED ORDER — OXYCODONE HCL 5 MG/5ML PO SOLN
5.0000 mg | Freq: Once | ORAL | Status: DC | PRN
  Filled 2018-07-02: qty 5

## 2018-07-02 MED ORDER — EPINEPHRINE PF 1 MG/ML IJ SOLN
INTRAMUSCULAR | Status: AC
  Filled 2018-07-02: qty 2

## 2018-07-02 MED ORDER — SUGAMMADEX SODIUM 200 MG/2ML IV SOLN
INTRAVENOUS | Status: DC | PRN
  Administered 2018-07-02: 200 mg via INTRAVENOUS

## 2018-07-02 MED ORDER — METHOCARBAMOL 500 MG PO TABS: 500.0000 mg | ORAL_TABLET | Freq: Four times a day (QID) | ORAL | 1 refills | Status: DC | PRN

## 2018-07-02 MED ORDER — SODIUM CHLORIDE 0.9 % IV SOLN
INTRAVENOUS | Status: DC | PRN
  Administered 2018-07-02: 500 mL

## 2018-07-02 MED ORDER — PHENOL 1.4 % MT LIQD
1.0000 | OROMUCOSAL | Status: DC | PRN
  Filled 2018-07-02: qty 177

## 2018-07-02 MED ORDER — ONDANSETRON HCL 4 MG PO TABS: 4.0000 mg | ORAL_TABLET | Freq: Four times a day (QID) | ORAL | Status: DC | PRN

## 2018-07-02 MED ORDER — OXYCODONE HCL 5 MG PO TABS: 5.0000 mg | ORAL_TABLET | Freq: Once | ORAL | Status: DC | PRN

## 2018-07-02 MED ORDER — SUCCINYLCHOLINE CHLORIDE 200 MG/10ML IV SOSY
PREFILLED_SYRINGE | INTRAVENOUS | Status: AC
  Filled 2018-07-02: qty 10

## 2018-07-02 MED ORDER — METHOCARBAMOL 1000 MG/10ML IJ SOLN
500.0000 mg | Freq: Three times a day (TID) | INTRAVENOUS | Status: DC | PRN
  Filled 2018-07-02: qty 5

## 2018-07-02 MED ORDER — DIPHENHYDRAMINE HCL 12.5 MG/5ML PO ELIX: 12.5000 mg | ORAL_SOLUTION | ORAL | Status: DC | PRN

## 2018-07-02 MED ORDER — EPINEPHRINE PF 1 MG/ML IJ SOLN
INTRAMUSCULAR | Status: DC | PRN
  Administered 2018-07-02: 2 mg

## 2018-07-02 MED ORDER — ONDANSETRON HCL 4 MG/2ML IJ SOLN: 4.0000 mg | Freq: Four times a day (QID) | INTRAMUSCULAR | Status: DC | PRN

## 2018-07-02 MED ORDER — POLYETHYLENE GLYCOL 3350 17 G PO PACK: 17.0000 g | PACK | Freq: Every day | ORAL | Status: DC | PRN

## 2018-07-02 MED ORDER — METHOCARBAMOL 500 MG IVPB - SIMPLE MED
INTRAVENOUS | Status: AC
  Filled 2018-07-02: qty 50

## 2018-07-02 MED ORDER — MIDAZOLAM HCL 2 MG/2ML IJ SOLN
INTRAMUSCULAR | Status: AC
  Filled 2018-07-02: qty 2

## 2018-07-02 MED ORDER — OXYCODONE-ACETAMINOPHEN 5-325 MG PO TABS
1.0000 | ORAL_TABLET | ORAL | Status: DC | PRN
  Administered 2018-07-02 – 2018-07-03 (×2): 1 via ORAL
  Filled 2018-07-02 (×2): qty 1

## 2018-07-02 MED ORDER — FENTANYL CITRATE (PF) 100 MCG/2ML IJ SOLN
INTRAMUSCULAR | Status: DC | PRN
  Administered 2018-07-02 (×2): 50 ug via INTRAVENOUS

## 2018-07-02 MED ORDER — PHENYLEPHRINE HCL 10 MG/ML IJ SOLN
INTRAMUSCULAR | Status: AC
  Filled 2018-07-02: qty 1

## 2018-07-02 MED ORDER — DEXAMETHASONE SODIUM PHOSPHATE 10 MG/ML IJ SOLN
INTRAMUSCULAR | Status: DC | PRN
  Administered 2018-07-02: 10 mg via INTRAVENOUS

## 2018-07-02 MED ORDER — METHOCARBAMOL 500 MG IVPB - SIMPLE MED
500.0000 mg | Freq: Four times a day (QID) | INTRAVENOUS | Status: DC | PRN
  Administered 2018-07-02: 500 mg via INTRAVENOUS
  Filled 2018-07-02: qty 50

## 2018-07-02 MED ORDER — SODIUM CHLORIDE 0.9 % IV SOLN
INTRAVENOUS | Status: AC
  Filled 2018-07-02: qty 500000

## 2018-07-02 MED ORDER — EPHEDRINE 5 MG/ML INJ
INTRAVENOUS | Status: AC
  Filled 2018-07-02: qty 10

## 2018-07-02 MED ORDER — EPHEDRINE SULFATE-NACL 50-0.9 MG/10ML-% IV SOSY
PREFILLED_SYRINGE | INTRAVENOUS | Status: DC | PRN
  Administered 2018-07-02 (×2): 10 mg via INTRAVENOUS

## 2018-07-02 MED ORDER — FENTANYL CITRATE (PF) 250 MCG/5ML IJ SOLN
INTRAMUSCULAR | Status: AC
  Filled 2018-07-02: qty 5

## 2018-07-02 MED ORDER — MIDAZOLAM HCL 5 MG/5ML IJ SOLN
INTRAMUSCULAR | Status: DC | PRN
  Administered 2018-07-02 (×2): 1 mg via INTRAVENOUS

## 2018-07-02 MED ORDER — DOCUSATE SODIUM 100 MG PO CAPS: 100.0000 mg | ORAL_CAPSULE | Freq: Two times a day (BID) | ORAL | 0 refills | Status: DC | PRN

## 2018-07-02 MED ORDER — CHLORHEXIDINE GLUCONATE 4 % EX LIQD: 60.0000 mL | Freq: Once | CUTANEOUS | Status: DC

## 2018-07-02 MED ORDER — BUPIVACAINE-EPINEPHRINE (PF) 0.25% -1:200000 IJ SOLN
INTRAMUSCULAR | Status: AC
  Filled 2018-07-02: qty 30

## 2018-07-02 MED ORDER — RISAQUAD PO CAPS
1.0000 | ORAL_CAPSULE | Freq: Every day | ORAL | Status: DC
  Administered 2018-07-02 – 2018-07-03 (×2): 1 via ORAL
  Filled 2018-07-02 (×2): qty 1

## 2018-07-02 MED ORDER — HYDROMORPHONE HCL 1 MG/ML IJ SOLN: 1.0000 mg | INTRAMUSCULAR | Status: DC | PRN

## 2018-07-02 MED ORDER — ROCURONIUM BROMIDE 50 MG/5ML IV SOSY
PREFILLED_SYRINGE | INTRAVENOUS | Status: DC | PRN
  Administered 2018-07-02: 30 mg via INTRAVENOUS

## 2018-07-02 MED ORDER — LIDOCAINE 2% (20 MG/ML) 5 ML SYRINGE
INTRAMUSCULAR | Status: DC | PRN
  Administered 2018-07-02: 60 mg via INTRAVENOUS

## 2018-07-02 MED ORDER — PROPOFOL 10 MG/ML IV BOLUS
INTRAVENOUS | Status: DC | PRN
  Administered 2018-07-02: 200 mg via INTRAVENOUS

## 2018-07-02 MED ORDER — LIDOCAINE-EPINEPHRINE (PF) 1 %-1:200000 IJ SOLN: INTRAMUSCULAR | Status: DC | PRN

## 2018-07-02 MED ORDER — MEPERIDINE HCL 50 MG/ML IJ SOLN: 6.2500 mg | INTRAMUSCULAR | Status: DC | PRN

## 2018-07-02 MED ORDER — METOCLOPRAMIDE HCL 5 MG PO TABS: 5.0000 mg | ORAL_TABLET | Freq: Three times a day (TID) | ORAL | Status: DC | PRN

## 2018-07-02 MED ORDER — CEFAZOLIN SODIUM-DEXTROSE 2-4 GM/100ML-% IV SOLN
2.0000 g | Freq: Four times a day (QID) | INTRAVENOUS | Status: AC
  Administered 2018-07-02 (×2): 2 g via INTRAVENOUS
  Filled 2018-07-02 (×2): qty 100

## 2018-07-02 MED ORDER — BISACODYL 5 MG PO TBEC: 5.0000 mg | DELAYED_RELEASE_TABLET | Freq: Every day | ORAL | Status: DC | PRN

## 2018-07-02 MED ORDER — MAGNESIUM CITRATE PO SOLN: 1.0000 | Freq: Once | ORAL | Status: DC | PRN

## 2018-07-02 MED ORDER — METOCLOPRAMIDE HCL 5 MG/ML IJ SOLN: 5.0000 mg | Freq: Three times a day (TID) | INTRAMUSCULAR | Status: DC | PRN

## 2018-07-02 MED ORDER — PROPOFOL 10 MG/ML IV BOLUS
INTRAVENOUS | Status: AC
  Filled 2018-07-02: qty 20

## 2018-07-02 MED ORDER — LIDOCAINE 2% (20 MG/ML) 5 ML SYRINGE
INTRAMUSCULAR | Status: AC
  Filled 2018-07-02: qty 5

## 2018-07-02 MED ORDER — ALUM & MAG HYDROXIDE-SIMETH 200-200-20 MG/5ML PO SUSP: 30.0000 mL | ORAL | Status: DC | PRN

## 2018-07-02 MED ORDER — MENTHOL 3 MG MT LOZG: 1.0000 | LOZENGE | OROMUCOSAL | Status: DC | PRN

## 2018-07-02 MED ORDER — ONDANSETRON HCL 4 MG/2ML IJ SOLN
INTRAMUSCULAR | Status: DC | PRN
  Administered 2018-07-02: 4 mg via INTRAVENOUS

## 2018-07-02 MED ORDER — PHENYLEPHRINE HCL 10 MG/ML IJ SOLN
INTRAVENOUS | Status: DC | PRN
  Administered 2018-07-02: 25 ug/min via INTRAVENOUS

## 2018-07-02 MED ORDER — SUCCINYLCHOLINE CHLORIDE 200 MG/10ML IV SOSY
PREFILLED_SYRINGE | INTRAVENOUS | Status: DC | PRN
  Administered 2018-07-02: 100 mg via INTRAVENOUS

## 2018-07-02 MED ORDER — ONDANSETRON HCL 4 MG/2ML IJ SOLN
INTRAMUSCULAR | Status: AC
  Filled 2018-07-02: qty 2

## 2018-07-02 MED ORDER — LORATADINE 10 MG PO TABS
10.0000 mg | ORAL_TABLET | Freq: Every day | ORAL | Status: DC
  Administered 2018-07-03: 10 mg via ORAL
  Filled 2018-07-02: qty 1

## 2018-07-02 MED ORDER — STERILE WATER FOR IRRIGATION IR SOLN
Status: DC | PRN
  Administered 2018-07-02: 500 mL

## 2018-07-02 MED ORDER — ALBUTEROL SULFATE HFA 108 (90 BASE) MCG/ACT IN AERS
INHALATION_SPRAY | RESPIRATORY_TRACT | Status: DC | PRN
  Administered 2018-07-02: 5 via RESPIRATORY_TRACT

## 2018-07-02 MED ORDER — DOCUSATE SODIUM 100 MG PO CAPS
100.0000 mg | ORAL_CAPSULE | Freq: Two times a day (BID) | ORAL | Status: DC
  Administered 2018-07-02 – 2018-07-03 (×2): 100 mg via ORAL
  Filled 2018-07-02 (×2): qty 1

## 2018-07-02 MED ORDER — METHOCARBAMOL 500 MG PO TABS: 500.0000 mg | ORAL_TABLET | Freq: Four times a day (QID) | ORAL | Status: DC | PRN

## 2018-07-02 MED ORDER — SUGAMMADEX SODIUM 200 MG/2ML IV SOLN
INTRAVENOUS | Status: AC
  Filled 2018-07-02: qty 2

## 2018-07-02 MED ORDER — HYDROMORPHONE HCL 1 MG/ML IJ SOLN: 0.2500 mg | INTRAMUSCULAR | Status: DC | PRN

## 2018-07-02 MED ORDER — ENOXAPARIN SODIUM 40 MG/0.4ML ~~LOC~~ SOLN
40.0000 mg | SUBCUTANEOUS | Status: DC
  Administered 2018-07-03: 40 mg via SUBCUTANEOUS
  Filled 2018-07-02: qty 0.4

## 2018-07-02 MED ORDER — DOCUSATE SODIUM 100 MG PO CAPS: 100.0000 mg | ORAL_CAPSULE | Freq: Two times a day (BID) | ORAL | 0 refills | Status: DC

## 2018-07-02 MED ORDER — DEXAMETHASONE SODIUM PHOSPHATE 10 MG/ML IJ SOLN
INTRAMUSCULAR | Status: AC
  Filled 2018-07-02: qty 1

## 2018-07-02 MED ORDER — LACTATED RINGERS IV SOLN
INTRAVENOUS | Status: DC
  Administered 2018-07-02: 08:00:00 via INTRAVENOUS

## 2018-07-02 MED ORDER — AMLODIPINE BESYLATE 5 MG PO TABS
5.0000 mg | ORAL_TABLET | Freq: Every day | ORAL | Status: DC
  Administered 2018-07-03: 5 mg via ORAL
  Filled 2018-07-02: qty 1

## 2018-07-02 SURGICAL SUPPLY — 61 items
ANCH SUT SWLK 19.1X4.75 VT (Anchor) ×4 IMPLANT
ANCHOR NDL 9/16 CIR SZ 8 (NEEDLE) IMPLANT
ANCHOR NEEDLE 9/16 CIR SZ 8 (NEEDLE) IMPLANT
ANCHOR PEEK 4.75X19.1 SWLK C (Anchor) ×4 IMPLANT
BLADE CUDA SHAVER 3.5 (BLADE) ×2 IMPLANT
BLADE SURG SZ11 CARB STEEL (BLADE) ×2 IMPLANT
BUR OVAL 4.0 (BURR) IMPLANT
CANNULA ACUFO 5X76 (CANNULA) ×2 IMPLANT
CLEANER TIP ELECTROSURG 2X2 (MISCELLANEOUS) IMPLANT
COVER SURGICAL LIGHT HANDLE (MISCELLANEOUS) ×2 IMPLANT
COVER WAND RF STERILE (DRAPES) IMPLANT
DRAPE ORTHO SPLIT 77X108 STRL (DRAPES) ×2
DRAPE POUCH INSTRU U-SHP 10X18 (DRAPES) IMPLANT
DRAPE STERI 35X30 U-POUCH (DRAPES) ×2 IMPLANT
DRAPE SURG ORHT 6 SPLT 77X108 (DRAPES) ×1 IMPLANT
DRSG AQUACEL AG ADV 3.5X 4 (GAUZE/BANDAGES/DRESSINGS) ×1 IMPLANT
DRSG AQUACEL AG ADV 3.5X 6 (GAUZE/BANDAGES/DRESSINGS) IMPLANT
DRSG PAD ABDOMINAL 8X10 ST (GAUZE/BANDAGES/DRESSINGS) ×1 IMPLANT
DURAPREP 26ML APPLICATOR (WOUND CARE) ×2 IMPLANT
ELECT NDL TIP 2.8 STRL (NEEDLE) IMPLANT
ELECT NEEDLE TIP 2.8 STRL (NEEDLE) ×2 IMPLANT
ELECT REM PT RETURN 15FT ADLT (MISCELLANEOUS) ×2 IMPLANT
FIBER TAPE 2MM (SUTURE) ×1 IMPLANT
FILTER STRAW (MISCELLANEOUS) ×3 IMPLANT
GLOVE BIOGEL PI IND STRL 7.5 (GLOVE) ×1 IMPLANT
GLOVE BIOGEL PI INDICATOR 7.5 (GLOVE) ×1
GLOVE SURG SS PI 7.5 STRL IVOR (GLOVE) ×4 IMPLANT
GLOVE SURG SS PI 8.0 STRL IVOR (GLOVE) ×4 IMPLANT
GOWN STRL REUS W/TWL XL LVL3 (GOWN DISPOSABLE) ×4 IMPLANT
KIT BASIN OR (CUSTOM PROCEDURE TRAY) ×2 IMPLANT
KIT POSITION SHOULDER SCHLEI (MISCELLANEOUS) ×1 IMPLANT
MANIFOLD NEPTUNE II (INSTRUMENTS) ×2 IMPLANT
NDL SCORPION MULTI FIRE (NEEDLE) IMPLANT
NDL SPNL 18GX3.5 QUINCKE PK (NEEDLE) ×1 IMPLANT
NEEDLE SCORPION MULTI FIRE (NEEDLE) ×2 IMPLANT
NEEDLE SPNL 18GX3.5 QUINCKE PK (NEEDLE) ×2 IMPLANT
PACK SHOULDER (CUSTOM PROCEDURE TRAY) ×2 IMPLANT
PROBE BIPOLAR ATHRO 135MM 90D (MISCELLANEOUS) IMPLANT
PROTECTOR NERVE ULNAR (MISCELLANEOUS) ×2 IMPLANT
SLING ARM FOAM STRAP LRG (SOFTGOODS) ×1 IMPLANT
SLING ARM IMMOBILIZER LRG (SOFTGOODS) IMPLANT
SLING ARM IMMOBILIZER MED (SOFTGOODS) IMPLANT
SLING ULTRA II L (ORTHOPEDIC SUPPLIES) IMPLANT
STRIP CLOSURE SKIN 1/2X4 (GAUZE/BANDAGES/DRESSINGS) ×1 IMPLANT
SUCTION FRAZIER HANDLE 12FR (TUBING) ×1
SUCTION TUBE FRAZIER 12FR DISP (TUBING) ×1 IMPLANT
SUT ETHIBOND NAB CT1 #1 30IN (SUTURE) IMPLANT
SUT ETHILON 3 0 PS 1 (SUTURE) ×1 IMPLANT
SUT ETHILON 4 0 PS 2 18 (SUTURE) IMPLANT
SUT FIBERWIRE #2 38 T-5 BLUE (SUTURE)
SUT PROLENE 3 0 PS 2 (SUTURE) ×1 IMPLANT
SUT TIGER TAPE 7 IN WHITE (SUTURE) ×1 IMPLANT
SUT VIC AB 1-0 CT2 27 (SUTURE) ×1 IMPLANT
SUT VIC AB 2-0 CT2 27 (SUTURE) ×1 IMPLANT
SUT VICRYL 0 27 CT2 27 ABS (SUTURE) ×1 IMPLANT
SUTURE FIBERWR #2 38 T-5 BLUE (SUTURE) IMPLANT
TAPE FIBER 2MM 7IN #2 BLUE (SUTURE) IMPLANT
TOWEL OR 17X26 10 PK STRL BLUE (TOWEL DISPOSABLE) ×2 IMPLANT
TUBING ARTHRO INFLOW-ONLY STRL (TUBING) ×2 IMPLANT
TUBING CONNECTING 10 (TUBING) IMPLANT
WIPE CHG CHLORHEXIDINE 2% (PERSONAL CARE ITEMS) ×1 IMPLANT

## 2018-07-02 NOTE — Anesthesia Postprocedure Evaluation (Signed)
Anesthesia Post Note  Patient: [name]  Procedure(s) Performed: Left shoulder arthroscopy, subacromial decompression, mini open rotator cuff repair, (Left Shoulder)     Patient location during evaluation: PACU Anesthesia Type: General and Regional Level of consciousness: awake and alert Pain management: pain level controlled Vital Signs Assessment: post-procedure vital signs reviewed and stable Respiratory status: spontaneous breathing, nonlabored ventilation, respiratory function stable and patient connected to nasal cannula oxygen Cardiovascular status: blood pressure returned to baseline and stable Postop Assessment: no apparent nausea or vomiting Anesthetic complications: no    Last Vitals:  Vitals:   07/02/18 1436 07/02/18 1532  BP: 118/76 (!) 121/94  Pulse: 62 87  Resp: 16 15  Temp: (!) 36.4 C 36.7 C  SpO2: 95% 97%    Last Pain:  Vitals:   07/02/18 1633  TempSrc:   PainSc: 0-No pain                 Trista Ciocca L Baleria Wyman

## 2018-07-02 NOTE — Transfer of Care (Signed)
Immediate Anesthesia Transfer of Care Note  Patient: [name]  Procedure(s) Performed: Left shoulder arthroscopy, subacromial decompression, mini open rotator cuff repair, possible patch graft (Left )  Patient Location: PACU  Anesthesia Type:General  Level of Consciousness: awake, alert  and oriented  Airway & Oxygen Therapy: Patient Spontanous Breathing and Patient connected to face mask oxygen  Post-op Assessment: Report given to RN and Post -op Vital signs reviewed and stable  Post vital signs: Reviewed and stable  Last Vitals:  Vitals Value Taken Time  BP 142/88 07/02/2018 10:27 AM  Temp    Pulse 72 07/02/2018 10:30 AM  Resp 14 07/02/2018 10:30 AM  SpO2 98 % 07/02/2018 10:30 AM  Vitals shown include unvalidated device data.  Last Pain:  Vitals:   07/02/18 1027  TempSrc:   PainSc: (P) 0-No pain      Patients Stated Pain Goal: 4 (48/18/59 0931)  Complications: No apparent anesthesia complications

## 2018-07-02 NOTE — Op Note (Signed)
[name], [facility] MEDICAL RECORD [id number] ACCOUNT [id number] DATE OF BIRTH:May 16, 1956 FACILITY: [facility] LOCATION: [facility] PHYSICIAN:Lamona Eimer Windy Kalata, MD  OPERATIVE REPORT  DATE OF PROCEDURE:  07/02/2018  PREOPERATIVE DIAGNOSES: 1.  Retracted tear of the rotator cuff impingement syndrome, left shoulder. 2.  Elevated body mass index of 47.  POSTOPERATIVE DIAGNOSES:   1.  Retracted tear of the rotator cuff impingement syndrome, left shoulder. 2.  Elevated body mass index of 47.  PROCEDURE PERFORMED: 1.  Left shoulder arthroscopy with bursectomy. 2.  Mini-open rotator cuff repair utilizing SwiveLock suture anchors of the infraspinatus and supraspinatus. 3.  Subacromial decompression acromioplasty.  TECHNIQUE:  Difficulty increased due to the patient's elevated BMI.  Assistant [place], [place], was used to provide assistance to position the arm, arthroscopic inflow, closure.  HISTORY:  A 63 year old female with retracted tear of the rotator cuff noted by MRI.  She was indicated for repair.  She had elevated BMI.  We discussed shoulder arthroscopy, evaluation of the rotator cuff since the MRI was over 6 months ago to evaluate  if it was repairable.  Then, a mini-open rotator cuff repair.  Risks and benefits discussed including bleeding, infection, damage to neurovascular structures, no change in symptoms, worsening symptoms, DVT, PE, anesthetic complications, etc.  TECHNIQUE:  With the patient in supine beach-chair position after induction of adequate general anesthesia in the usual cervical spine positioning, the left shoulder and upper extremity were prepped and draped in the usual sterile fashion.  Due to her  elevated BMI, the bony landmarks were somewhat obscured.  We palpated additionally.  We used a surgical marker to delineate the region of the acromion, AC joint, and coracoid.  A standard posterolateral portal was utilized with an incision through the  skin only with a #11  blade.  I advanced the arthroscopic camera into the subacromial space.  I then insufflated with 65 mmHg.  I made an anterolateral incision with a #11 blade through the skin only, triangulating the subacromial space.  I introduced a  shaver and performed a bursectomy to evaluate the rotator cuff.  It was torn and retracted both supraspinatus and infraspinatus, but it did appear that it was mobile enough to the point where it was amenable to repair.  After the bursectomy, I evaluated  anteriorly and decided to proceed with a mini-open rotator cuff repair.  We removed all instrumentation, closed the portals with 4-0 nylon simple sutures.  I made a 3 cm incision over the anterolateral aspect of the acromion through the skin only.   Subcutaneous tissue was dissected with electrocautery to achieve hemostasis.  It had a significant subcutaneous adipose layer.  I identified the raphae between the anterolateral heads and made an incision in line with the skin incision, preserving the  deltoid attachment.  Self-retaining long retractors were placed.  I then used a 3 mm Kerrison to perform an acromioplasty over the anterolateral aspect of the acromion.  With a small spur, partial release of CA ligament with an AO elevator.  I then  fashioned a trough in the greater tuberosity with a bare rongeur to good bleeding tissue.  I debrided the end of the rotator cuff and mobilized the anterior bursal and articular surfaces.  Without  significant difficulty, advanced to the bed.  I then  placed 2 SwiveLock suture anchors just lateral to the articular surface in the greater tuberosity bed, 1 anteriorly just posterior to the bicipital groove and 1 posteriorly as there was a tear of both the  supraspinatus and infraspinatus.  We then passed  sutures with a Scorpion suture passer utilizing FiberTape, 2 anteriorly and 2 posteriorly.  This was from 2 separate SwiveLock anchors.  The second one was placed posterior to the first, again  lateral to the articular surface, approximately a 1.5 cm  distance.  This was angled towards the elbow and towards the humeral head.  Both had good grasp of the tendon.  The tendon was advanced approximately a centimeter to the greater tuberosity and secured over the lateral aspect of the greater tuberosity.  A  second row SwiveLock after fashioning a pilot hole with an awl, inserting the SutureLock.  All had excellent resistance to pullout.  Threading the SwiveLock suture anchor without undue tension, the arm in the neutral position, and securing the rotator  cuff tendon, full coverage was obtained.  Redundant suture was then removed and inspected the remainder of the cuff, digitally lysing subdeltoid to bursal adhesions.  We copiously irrigated with antibiotic irrigation.  Good subacromial space was noted.   Full coverage was noted.  Excellent repair.  I copiously irrigated the wound.  Repaired the raphae with 1 and 0 Vicryl interrupted figure-of-eight sutures.  The deltoid attachment remained.  Subcu with multiple 2-0 layers and the skin with Prolene.   Sterile dressing applied.  Placed in a sling, extubated without difficulty and transported to the recovery room in satisfactory condition.  The patient tolerated the procedure well.  There were no complications.  Assistant [place]ie [place], [place], was used throughout the case.  Again, technical difficulty increased due to the patient's elevated BMI.  LN/NUANCE  D:07/02/2018 T:07/02/2018 JOB:[id number]

## 2018-07-02 NOTE — Anesthesia Procedure Notes (Signed)
Anesthesia Regional Block: Interscalene brachial plexus block   Pre-Anesthetic Checklist: ,, timeout performed, Correct Patient, Correct Site, Correct Laterality, Correct Procedure, Correct Position, site marked, Risks and benefits discussed,  Surgical consent,  Pre-op evaluation,  At surgeon's request and post-op pain management  Laterality: Left  Prep: chloraprep       Needles:  Injection technique: Single-shot  Needle Type: Stimiplex     Needle Length: 9cm  Needle Gauge: 21     Additional Needles:   Procedures:,,,, ultrasound used (permanent image in chart),,,,  Narrative:  Start time: 07/02/2018 8:13 AM End time: 07/02/2018 8:18 AM Injection made incrementally with aspirations every 5 mL.  Performed by: Personally  Anesthesiologist: Lynda Rainwater, MD

## 2018-07-02 NOTE — Brief Op Note (Signed)
07/02/2018  10:01 AM  PATIENT:  [name]  63 y.o. female  PRE-OPERATIVE DIAGNOSIS:  Left shoulder rotator cuff tear, impingement  POST-OPERATIVE DIAGNOSIS:  Left shoulder rotator cuff tear, impingement  PROCEDURE:  Procedure(s) with comments: Left shoulder arthroscopy, subacromial decompression, mini open rotator cuff repair, possible patch graft (Left) - 90 mins  SURGEON:  Surgeon(s) and Role:    Susa Day, MD - Primary  PHYSICIAN ASSISTANT:   ASSISTANTS: [place]   ANESTHESIA:   general  EBL:  min   BLOOD ADMINISTERED:none  DRAINS: none   LOCAL MEDICATIONS USED:  MARCAINE     SPECIMEN:  No Specimen  DISPOSITION OF SPECIMEN:  N/A  COUNTS:  YES  TOURNIQUET:  * No tourniquets in log *  DICTATION: .Other Dictation: Dictation Number [id number]  PLAN OF CARE: Admit for overnight observation  PATIENT DISPOSITION:  PACU - hemodynamically stable.   Delay start of Pharmacological VTE agent (>24hrs) due to surgical blood loss or risk of bleeding: no

## 2018-07-02 NOTE — Progress Notes (Signed)
PHARMACIST - PHYSICIAN ORDER COMMUNICATION  CONCERNING: P&T Medication Policy on Herbal Medications  DESCRIPTION:  This patient's order for:  biotin  has been noted.  This product(s) is classified as an "herbal" or natural product. Due to a lack of definitive safety studies or FDA approval, nonstandard manufacturing practices, plus the potential risk of unknown drug-drug interactions while on inpatient medications, the Pharmacy and Therapeutics Committee does not permit the use of "herbal" or natural products of this type within [facility].   ACTION TAKEN: The pharmacy department is unable to verify this order at this time and the order has been discontinued. Please reevaluate patient's clinical condition at discharge and address if the herbal or natural product(s) should be resumed at that time.  Royetta Asal, PharmD, BCPS Pager [phone] 07/02/2018 1:06 PM

## 2018-07-02 NOTE — Anesthesia Procedure Notes (Signed)
Procedure Name: Intubation Date/Time: 07/02/2018 8:50 AM Performed by: Maxwell Caul, CRNA Pre-anesthesia Checklist: Patient identified, Emergency Drugs available, Suction available and Patient being monitored Patient Re-evaluated:Patient Re-evaluated prior to induction Oxygen Delivery Method: Circle system utilized Preoxygenation: Pre-oxygenation with 100% oxygen Induction Type: IV induction Ventilation: Mask ventilation without difficulty Laryngoscope Size: Mac and 3 Grade View: Grade I Tube type: Oral Number of attempts: 1 Airway Equipment and Method: Stylet Placement Confirmation: ETT inserted through vocal cords under direct vision,  positive ETCO2 and breath sounds checked- equal and bilateral Secured at: 21 cm Tube secured with: Tape Dental Injury: Teeth and Oropharynx as per pre-operative assessment

## 2018-07-02 NOTE — Interval H&P Note (Signed)
History and Physical Interval Note:  07/02/2018 8:30 AM  [name]  has presented today for surgery, with the diagnosis of Left shoulder rotator cuff tear, impingement  The various methods of treatment have been discussed with the patient and family. After consideration of risks, benefits and other options for treatment, the patient has consented to  Procedure(s) with comments: Left shoulder arthroscopy, subacromial decompression, mini open rotator cuff repair, possible patch graft (Left) - 90 mins as a surgical intervention .  The patient's history has been reviewed, patient examined, no change in status, stable for surgery.  I have reviewed the patient's chart and labs.  Questions were answered to the patient's satisfaction.     Johnn Hai

## 2018-07-02 NOTE — Discharge Instructions (Signed)

## 2018-07-02 NOTE — Anesthesia Preprocedure Evaluation (Signed)
Anesthesia Evaluation  Patient identified by MRN, date of birth, ID band Patient awake    Reviewed: Allergy & Precautions, NPO status , Patient's Chart, lab work & pertinent test results, reviewed documented beta blocker date and time   Airway Mallampati: II  TM Distance: >3 FB Neck ROM: Full    Dental  (+) Teeth Intact, Dental Advisory Given   Pulmonary former smoker,    Pulmonary exam normal breath sounds clear to auscultation       Cardiovascular hypertension, Pt. on medications and Pt. on home beta blockers (-) angina(-) Past MI Normal cardiovascular exam Rhythm:Regular Rate:Normal     Neuro/Psych negative neurological ROS  negative psych ROS   GI/Hepatic negative GI ROS, Neg liver ROS,   Endo/Other  Morbid obesity  Renal/GU negative Renal ROS     Musculoskeletal  (+) Arthritis , Osteoarthritis,  Right shoulder rotator cuff tear   Abdominal   Peds  Hematology negative hematology ROS (+)   Anesthesia Other Findings Day of surgery medications reviewed with the patient.  Reproductive/Obstetrics                             Anesthesia Physical  Anesthesia Plan  ASA: III  Anesthesia Plan: General   Post-op Pain Management:    Induction: Intravenous  PONV Risk Score and Plan: 3 and Midazolam, Dexamethasone and Ondansetron  Airway Management Planned: Oral ETT  Additional Equipment:   Intra-op Plan:   Post-operative Plan: Extubation in OR  Informed Consent: I have reviewed the patients History and Physical, chart, labs and discussed the procedure including the risks, benefits and alternatives for the proposed anesthesia with the patient or authorized representative who has indicated his/her understanding and acceptance.   Dental advisory given  Plan Discussed with: CRNA  Anesthesia Plan Comments:         Anesthesia Quick Evaluation

## 2018-07-03 ENCOUNTER — Encounter (HOSPITAL_COMMUNITY): Payer: Self-pay | Admitting: Specialist

## 2018-07-03 DIAGNOSIS — M75102 Unspecified rotator cuff tear or rupture of left shoulder, not specified as traumatic: Secondary | ICD-10-CM | POA: Diagnosis not present

## 2018-07-03 NOTE — Evaluation (Signed)
Occupational Therapy Evaluation Patient Details Name: [name] MRN: [id number] DOB: [birth year] Today's Date: 07/03/2018    History of Present Illness Pt is a 63 y/o female now s/p L shoulder arthroscopy, subacromial decompression, mini open rotator cuff repair. PMHx includes pre-diabetes, HTN, hx of R shoulder surgery 09/2017    Clinical Impression   This 63 y/o female presents with the above. At baseline pt is independent with ADL and functional mobility. Pt presents supine in bed pleasant and willing to participate in therapy session. Pt demonstrating functional mobility at minguard level, currently requires modA for UB and LB ADL secondary to LUE functional limitations. Pt reports still feeling nerve block during this session. Educated both pt and pt's niece re: shoulder precautions, safety and compensatory strategies for performing ADL while maintaining precautions with pt and pt's niece verbalizing understanding. Pt requiring intermittent cues to maintain precautions during tasks this session, though with improved carryover as session progressed. Questions answered throughout session with no further acute OT needs identified at this time. Pt reports feeling comfortable performing ADL and HEP as instructed given available assist from niece PRN. Recommend pt follow up with therapies after discharge as recommended by MD. Acute OT to sign off at this time. Thank you for this referral.     Follow Up Recommendations  Follow surgeon's recommendation for DC plan and follow-up therapies;Home health OT(per notes, home with [facility])    Equipment Recommendations  None recommended by OT           Precautions / Restrictions Precautions Precautions: Shoulder Type of Shoulder Precautions: sling on at all times except ADL/exercise; okay for AROM e/w/h to tolerance; okay for pendulums; No AROM to operative shoulder Shoulder Interventions: Shoulder sling/immobilizer;At all times;Off for  dressing/bathing/exercises Precaution Booklet Issued: Yes (comment) Precaution Comments: issued and reviewed with pt/pt's niece Required Braces or Orthoses: Sling Restrictions Weight Bearing Restrictions: Yes LUE Weight Bearing: Non weight bearing      Mobility Bed Mobility Overal bed mobility: Needs Assistance Bed Mobility: Supine to Sit     Supine to sit: Supervision     General bed mobility comments: for general safety, no physical assist required, HOB slightly elevated with pt transitioning to sitting to R side without use of UE support  Transfers Overall transfer level: Needs assistance Equipment used: None Transfers: Sit to/from Stand Sit to Stand: Supervision         General transfer comment: for general safety, no assist required and no LOB noted    Balance Overall balance assessment: No apparent balance deficits (not formally assessed)                                         ADL either performed or assessed with clinical judgement   ADL Overall ADL's : Needs assistance/impaired Eating/Feeding: Modified independent;Sitting Eating/Feeding Details (indicate cue type and reason): pt reports using RUE for eating breakfast today Grooming: Set up;Sitting   Upper Body Bathing: Minimal assistance;Sitting;Cueing for UE precautions   Lower Body Bathing: Minimal assistance;Sit to/from stand   Upper Body Dressing : Moderate assistance;Sitting Upper Body Dressing Details (indicate cue type and reason): for UB clothing and sling management Lower Body Dressing: Minimal assistance;Sit to/from stand Lower Body Dressing Details (indicate cue type and reason): assist to don TEDs and socks this session Toilet Transfer: Min guard;Ambulation   Toileting- Clothing Manipulation and Hygiene: Min guard;Sit to/from stand  Functional mobility during ADLs: Min guard General ADL Comments: educated both pt/pt's niece re: shoulder precautions, safety and  compensatory strategies for performing ADL while maintaining precautions, both pt/pt's niece verbalizing understanding, pt requires intermittent cues to maintain precautions during functional tasks this session     Vision         Perception     Praxis      Pertinent Vitals/Pain Pain Assessment: No/denies pain(still feeling nerve block)     Hand Dominance Left   Extremity/Trunk Assessment Upper Extremity Assessment Upper Extremity Assessment: LUE deficits/detail LUE Deficits / Details: s/p L shoulder sx; decreased AROM to e/w/h due to still feeling effects of nerve block LUE: Unable to fully assess due to immobilization LUE Sensation: decreased light touch LUE Coordination: decreased fine motor   Lower Extremity Assessment Lower Extremity Assessment: Overall WFL for tasks assessed       Communication Communication Communication: No difficulties   Cognition Arousal/Alertness: Awake/alert Behavior During Therapy: WFL for tasks assessed/performed Overall Cognitive Status: Within Functional Limits for tasks assessed                                     General Comments  pt's sister and niece present during end of session    Exercises Shoulder Exercises Pendulum Exercise: PROM;Standing;5 reps;Left Elbow Flexion: AAROM;AROM;5 reps;Left;Seated(limited reps due to feeling nerve block) Elbow Extension: AROM;AAROM;5 reps;Left;Seated(limited reps due to still feeling nerve block) Wrist Flexion: AROM;AAROM;5 reps;Left;Seated Wrist Extension: AROM;AAROM;5 reps;Left;Seated Digit Composite Flexion: AROM;AAROM;5 reps;Left Composite Extension: AROM;AAROM;5 reps;Left;Seated Neck Flexion: AROM;Seated Neck Extension: AROM;Seated Neck Lateral Flexion - Right: AROM;Seated Neck Lateral Flexion - Left: AROM;Seated Hand Exercises Forearm Supination: AROM;AAROM;5 reps;Left;Seated Forearm Pronation: AROM;AAROM;5 reps;Left;Seated   Shoulder Instructions Shoulder  Instructions Donning/doffing shirt without moving shoulder: Moderate assistance;Caregiver independent with task;Patient able to independently direct caregiver Method for sponge bathing under operated UE: Minimal assistance;Caregiver independent with task;Patient able to independently direct caregiver Donning/doffing sling/immobilizer: Moderate assistance;Caregiver independent with task;Patient able to independently direct caregiver Pendulum exercises (written home exercise program): Minimal assistance;Caregiver independent with task;Patient able to independently direct caregiver ROM for elbow, wrist and digits of operated UE: Min-guard Sling wearing schedule (on at all times/off for ADL's): Independent Proper positioning of operated UE when showering: Min-guard Positioning of UE while sleeping: Min-guard;Caregiver independent with task;Patient able to independently direct caregiver    Home Living Family/patient expects to be discharged to:: Private residence Living Arrangements: Other (Comment)(niece) Available Help at Discharge: Family;Available PRN/intermittently Type of Home: House Home Access: Stairs to enter [employer] of Steps: 3 Entrance Stairs-Rails: None Home Layout: One level     Bathroom Shower/Tub: Teacher, early years/pre: (comfort height)     Home Equipment: None          Prior Functioning/Environment Level of Independence: Independent        Comments: works at the [employer]        OT Problem List: Decreased strength;Decreased activity tolerance;Decreased range of motion;Impaired UE functional use;Decreased knowledge of precautions      OT Treatment/Interventions:      OT Goals(Current goals can be found in the care plan section) Acute Rehab OT Goals Patient Stated Goal: home today  OT Goal Formulation: All assessment and education complete, DC therapy  OT Frequency:     Barriers to D/C:            Co-evaluation  AM-PAC OT "6 Clicks" Daily Activity     Outcome Measure Help from another person eating meals?: A Little Help from another person taking care of personal grooming?: A Little Help from another person toileting, which includes using toliet, bedpan, or urinal?: A Little Help from another person bathing (including washing, rinsing, drying)?: A Little Help from another person to put on and taking off regular upper body clothing?: A Lot Help from another person to put on and taking off regular lower body clothing?: A Little 6 Click Score: 17   End of Session Equipment Utilized During Treatment: Other (comment)(sling)  Activity Tolerance: Patient tolerated treatment well Patient left: with call bell/phone within reach;with family/visitor present;Other (comment)(seated EOB)  OT Visit Diagnosis: Muscle weakness (generalized) (M62.81)                Time: 6945-0388 OT Time Calculation (min): 37 min Charges:  OT General Charges $OT Visit: 1 Visit OT Evaluation $OT Eval Low Complexity: 1 Low OT Treatments $Self Care/Home Management : 8-22 mins  Lou Cal, OT [facility] Pager [phone] Office [place] 07/03/2018, 10:43 AM

## 2018-07-03 NOTE — Discharge Summary (Signed)
Patient ID: [name] MRN: [id number] DOB/AGE: 29-Aug-1955 63 y.o.  Admit date: 07/02/2018 Discharge date: 07/03/2018  Admission Diagnoses:  Principal Problem:   Left rotator cuff tear Active Problems:   Complete rotator cuff tear   Discharge Diagnoses:  Same  Past Medical History:  Diagnosis Date  . Alopecia   . Herpes zoster 1992   posterior thorax  . Hyperlipidemia 2006    LDL 104, HDL 50  . Hypertension   . OA (osteoarthritis) of knee    Bilateral  . Other abnormal glucose   . Ovarian cyst    Small  . Pre-diabetes   . Uterine fibroid   . Vitamin D deficiency     Surgeries: Procedure(s): Left shoulder arthroscopy, subacromial decompression, mini open rotator cuff repair, on 07/02/2018   Consultants:   Discharged Condition: Improved  Hospital Course: [name] is an 63 y.o. female who was admitted 07/02/2018 for operative treatment ofLeft rotator cuff tear. Patient has severe unremitting pain that affects sleep, daily activities, and work/hobbies. After pre-op clearance the patient was taken to the operating room on 07/02/2018 and underwent  Procedure(s): Left shoulder arthroscopy, subacromial decompression, mini open rotator cuff repair,.    Patient was given perioperative antibiotics:  Anti-infectives (From admission, onward)   Start     Dose/Rate Route Frequency Ordered Stop   07/02/18 1400  ceFAZolin (ANCEF) IVPB 2g/100 mL premix     2 g 200 mL/hr over 30 Minutes Intravenous Every 6 hours 07/02/18 1251 07/02/18 2015   07/02/18 0932  polymyxin B 500,000 Units, bacitracin 50,000 Units in sodium chloride 0.9 % 500 mL irrigation  Status:  Discontinued       As needed 07/02/18 0933 07/02/18 1017   07/02/18 0600  ceFAZolin (ANCEF) 3 g in dextrose 5 % 50 mL IVPB     3 g 100 mL/hr over 30 Minutes Intravenous On call to O.R. 07/01/18 1245 07/02/18 0906       Patient was given sequential compression devices, early ambulation, and chemoprophylaxis to prevent  DVT.  Patient benefited maximally from hospital stay and there were no complications.    Recent vital signs:  Patient Vitals for the past 24 hrs:  BP Temp Temp src Pulse Resp SpO2 Height Weight  07/03/18 0803 (!) 125/93 - - 68 - - - -  07/03/18 0523 132/78 97.6 F (36.4 C) Oral (!) 58 17 98 % - -  07/03/18 0148 136/85 (!) 97.5 F (36.4 C) Oral 74 17 97 % - -  07/02/18 2130 (!) 108/93 97.8 F (36.6 C) Oral 74 16 95 % - -  07/02/18 1532 (!) 121/94 98 F (36.7 C) Oral 87 15 97 % - -  07/02/18 1436 118/76 (!) 97.5 F (36.4 C) Oral 62 16 95 % - -  07/02/18 1337 126/78 98.3 F (36.8 C) Oral 68 16 94 % - -  07/02/18 1230 120/82 98 F (36.7 C) Oral 65 18 98 % 5\' 4"  (1.626 m) 124.3 kg  07/02/18 1200 109/73 - - (!) 59 (!) 23 95 % - -  07/02/18 1145 115/71 - - 64 18 97 % - -  07/02/18 1130 127/82 (!) 97.5 F (36.4 C) - 63 19 96 % - -  07/02/18 1115 125/82 97.8 F (36.6 C) - 63 (!) 23 98 % - -  07/02/18 1100 127/82 - - 66 (!) 22 97 % - -  07/02/18 1045 128/74 - - 66 20 97 % - -  07/02/18 1030 123/84 - -  72 14 98 % - -  07/02/18 1027 (!) 142/88 97.8 F (36.6 C) - 73 (!) 23 98 % - -     Recent laboratory studies: No results for input(s): WBC, HGB, HCT, PLT, NA, K, CL, CO2, BUN, CREATININE, GLUCOSE, INR, CALCIUM in the last 72 hours.  Invalid input(s): PT, 2   Discharge Medications:   Allergies as of 07/03/2018      Reactions   Benazepril Hcl    REACTION: angioedema of lip. She cannot take angiotensin receptor blockers because of this history.      Medication List    TAKE these medications   amLODipine 5 MG tablet Commonly known as:  NORVASC TAKE 1 TABLET BY MOUTH ONCE DAILY   aspirin 81 MG tablet Take 81 mg by mouth daily.   Biotin 5000 MCG Caps Take 1 capsule by mouth daily.   docusate sodium 100 MG capsule Commonly known as:  COLACE Take 1 capsule (100 mg total) by mouth 2 (two) times daily.   loratadine 10 MG tablet Commonly known as:  CLARITIN Take 10 mg by  mouth daily.   methocarbamol 500 MG tablet Commonly known as:  ROBAXIN Take 1 tablet (500 mg total) by mouth every 6 (six) hours as needed for muscle spasms.   metoprolol succinate 100 MG 24 hr tablet Commonly known as:  TOPROL-XL TAKE 1 TABLET BY MOUTH ONCE DAILY   oxyCODONE 5 MG immediate release tablet Commonly known as:  Oxy IR/ROXICODONE Take 1-2 tablets (5-10 mg total) by mouth every 4 (four) hours as needed for moderate pain or severe pain.   Vitamin D-3 25 MCG (1000 UT) Caps Take 1 capsule by mouth daily.       Diagnostic Studies: No results found.  Disposition: Discharge disposition: 01-Home or Self Care       Discharge Instructions    Call MD / Call 911   Complete by:  As directed    If you experience chest pain or shortness of breath, CALL 911 and be transported to the hospital emergency room.  If you develope a fever above 101 F, pus (white drainage) or increased drainage or redness at the wound, or calf pain, call your surgeon's office.   Constipation Prevention   Complete by:  As directed    Drink plenty of fluids.  Prune juice may be helpful.  You may use a stool softener, such as Colace (over the counter) 100 mg twice a day.  Use MiraLax (over the counter) for constipation as needed.   Diet - low sodium heart healthy   Complete by:  As directed    Increase activity slowly as tolerated   Complete by:  As directed       Follow-up Information    Susa Day, MD Follow up in 2 week(s).   Specialty:  Orthopedic Surgery Contact information: [address] [place] [address] [phone]            Signed: Cecilie Kicks. 07/03/2018, 9:51 AM

## 2018-07-03 NOTE — Progress Notes (Signed)
Subjective: 1 Day Post-Op Procedure(s) (LRB): Left shoulder arthroscopy, subacromial decompression, mini open rotator cuff repair, (Left) Patient reports pain as 3 on 0-10 scale.    Objective: Vital signs in last 24 hours: Temp:  [97.5 F (36.4 C)-98.3 F (36.8 C)] 97.6 F (36.4 C) (01/09 0523) Pulse Rate:  [58-87] 58 (01/09 0523) Resp:  [14-23] 17 (01/09 0523) BP: (108-142)/(71-94) 132/78 (01/09 0523) SpO2:  [94 %-98 %] 98 % (01/09 0523) Weight:  [124.3 kg] 124.3 kg (01/08 1230)  Intake/Output from previous day: 01/08 0701 - 01/09 0700 In: 2520.8 [P.O.:540; I.V.:1757.5; IV Piggyback:223.3] Out: 1110 [Urine:1100; Blood:10] Intake/Output this shift: No intake/output data recorded.  No results for input(s): HGB in the last 72 hours. No results for input(s): WBC, RBC, HCT, PLT in the last 72 hours. No results for input(s): NA, K, CL, CO2, BUN, CREATININE, GLUCOSE, CALCIUM in the last 72 hours. No results for input(s): LABPT, INR in the last 72 hours.  Intact pulses distally Incision: dressing C/D/I No cellulitis present Compartment soft Good pulses. Block partially intact   Assessment/Plan: 1 Day Post-Op Procedure(s) (LRB): Left shoulder arthroscopy, subacromial decompression, mini open rotator cuff repair, (Left) Advance diet Up with therapy D/C IV fluids Discharge home with home health  Discussed [place] 07/03/2018, 7:24 AM

## 2018-07-15 DIAGNOSIS — M25511 Pain in right shoulder: Secondary | ICD-10-CM | POA: Diagnosis not present

## 2018-07-15 DIAGNOSIS — M75102 Unspecified rotator cuff tear or rupture of left shoulder, not specified as traumatic: Secondary | ICD-10-CM | POA: Diagnosis not present

## 2018-08-15 DIAGNOSIS — M25612 Stiffness of left shoulder, not elsewhere classified: Secondary | ICD-10-CM | POA: Insufficient documentation

## 2018-09-01 DIAGNOSIS — M25612 Stiffness of left shoulder, not elsewhere classified: Secondary | ICD-10-CM | POA: Diagnosis not present

## 2018-09-16 DIAGNOSIS — Z01419 Encounter for gynecological examination (general) (routine) without abnormal findings: Secondary | ICD-10-CM | POA: Diagnosis not present

## 2018-09-16 DIAGNOSIS — Z6841 Body Mass Index (BMI) 40.0 and over, adult: Secondary | ICD-10-CM | POA: Diagnosis not present

## 2018-09-16 DIAGNOSIS — Z124 Encounter for screening for malignant neoplasm of cervix: Secondary | ICD-10-CM | POA: Diagnosis not present

## 2018-10-29 ENCOUNTER — Encounter: Payer: Federal, State, Local not specified - PPO | Admitting: Internal Medicine

## 2018-11-02 ENCOUNTER — Other Ambulatory Visit: Payer: Self-pay | Admitting: Internal Medicine

## 2018-11-02 DIAGNOSIS — I10 Essential (primary) hypertension: Secondary | ICD-10-CM

## 2018-12-15 NOTE — Progress Notes (Signed)
Subjective:    Patient ID: [name], female    DOB: 04-28-1956, 63 y.o.   MRN: [id number]  HPI She is here for a physical exam.   She has retired since she was here last.  She has no concerns.   Medications and allergies reviewed with patient and updated if appropriate.  Patient Active Problem List   Diagnosis Date Noted  . Left rotator cuff tear 07/02/2018  . Right rotator cuff tear 10/18/2017  . Complete rotator cuff tear 10/18/2017  . Chronic pain of both shoulders 04/17/2017  . Bilateral primary osteoarthritis of knee 04/11/2016  . Alopecia areata 09/24/2013  . Vitamin D deficiency 10/01/2012  . Pre-diabetes 03/24/2009  . Essential hypertension 02/26/2008    Current Outpatient Medications on File Prior to Visit  Medication Sig Dispense Refill  . amLODipine (NORVASC) 5 MG tablet Take 1 tablet by mouth once daily 30 tablet 0  . aspirin 81 MG tablet Take 81 mg by mouth daily.    . Biotin 5000 MCG CAPS Take 1 capsule by mouth daily.     . Cholecalciferol (VITAMIN D-3) 1000 UNITS CAPS Take 1 capsule by mouth daily.    Marland Kitchen loratadine (CLARITIN) 10 MG tablet Take 10 mg by mouth daily.    . metoprolol succinate (TOPROL-XL) 100 MG 24 hr tablet Take 1 tablet by mouth once daily 30 tablet 0   No current facility-administered medications on file prior to visit.     Past Medical History:  Diagnosis Date  . Alopecia   . Herpes zoster 1992   posterior thorax  . Hyperlipidemia 2006    LDL 104, HDL 50  . Hypertension   . OA (osteoarthritis) of knee    Bilateral  . Other abnormal glucose   . Ovarian cyst    Small  . Pre-diabetes   . Uterine fibroid   . Vitamin D deficiency     Past Surgical History:  Procedure Laterality Date  . COLONOSCOPY  2007, 07/25/2016   negative  . CYSTECTOMY     peri rectal  . FOOT SURGERY     bilaterally  . INCONTINENCE SURGERY  2005   Dr Quincy Simmonds  . LIPOMA EXCISION  2011    L chest , Dr Georgette Dover  . SHOULDER ARTHROSCOPY WITH ROTATOR CUFF  REPAIR AND SUBACROMIAL DECOMPRESSION Right 10/18/2017   Procedure: Right shoulder arthroscopy, subacromial decompression, labrial debridement mini open rotator cuff repair;  Surgeon: Susa Day, MD;  Location: [facility];  Service: Orthopedics;  Laterality: Right;  120 mins  . SHOULDER ARTHROSCOPY WITH ROTATOR CUFF REPAIR AND SUBACROMIAL DECOMPRESSION Left 07/02/2018   Procedure: Left shoulder arthroscopy, subacromial decompression, mini open rotator cuff repair,;  Surgeon: Susa Day, MD;  Location: [facility];  Service: Orthopedics;  Laterality: Left;  90 mins    Social History   Socioeconomic History  . Marital status: Single    Spouse name: Not on file  . Number of children: Not on file  . Years of education: Not on file  . Highest education level: Not on file  Occupational History  . Not on file  Social Needs  . Financial resource strain: Not on file  . Food insecurity    Worry: Not on file    Inability: Not on file  . Transportation needs    Medical: Not on file    Non-medical: Not on file  Tobacco Use  . Smoking status: Former Smoker    Quit date: 06/25/1980    Years since quitting:  38.5  . Smokeless tobacco: Never Used  . Tobacco comment: smoked [age] , up to 1/2 ppd  Substance and Sexual Activity  . Alcohol use: Not Currently  . Drug use: No  . Sexual activity: Yes    Birth control/protection: Post-menopausal  Lifestyle  . Physical activity    Days per week: Not on file    Minutes per session: Not on file  . Stress: Not on file  Relationships  . Social Herbalist on phone: Not on file    Gets together: Not on file    Attends religious service: Not on file    Active member of club or organization: Not on file    Attends meetings of clubs or organizations: Not on file    Relationship status: Not on file  Other Topics Concern  . Not on file  Social History Narrative   No regular exercise    Family History  Problem Relation Age of Onset  . Diabetes  Mother   . Stroke Father 72  . Hypertension Father   . Hypertension Sister   . Hypertension Maternal Aunt   . Diabetes Maternal Aunt   . Cancer Neg Hx   . Colon cancer Neg Hx   . Breast cancer Neg Hx     Review of Systems  Constitutional: Negative for chills and fever.  Eyes: Negative for visual disturbance.  Respiratory: Negative for cough, shortness of breath and wheezing.   Cardiovascular: Positive for leg swelling (left ankle at times). Negative for chest pain and palpitations.  Gastrointestinal: Negative for abdominal pain, blood in stool, constipation, diarrhea and nausea.       No gerd  Genitourinary: Negative for dysuria.  Musculoskeletal: Positive for arthralgias (knees).  Skin: Negative for color change.  Neurological: Negative for dizziness, light-headedness, numbness and headaches.  Psychiatric/Behavioral: Negative for dysphoric mood. The patient is not nervous/anxious.        Objective:   Vitals:   12/16/18 1325  BP: (!) 142/90  Pulse: (!) 57  Resp: 16  Temp: 97.9 F (36.6 C)  SpO2: 97%   Filed Weights   12/16/18 1325  Weight: 277 lb 1.9 oz (125.7 kg)   Body mass index is 47.57 kg/m.  BP Readings from Last 3 Encounters:  12/16/18 (!) 142/90  07/03/18 (!) 125/93  06/23/18 (!) 152/91    Wt Readings from Last 3 Encounters:  12/16/18 277 lb 1.9 oz (125.7 kg)  07/02/18 274 lb (124.3 kg)  06/23/18 274 lb (124.3 kg)     Physical Exam Constitutional: She appears well-developed and well-nourished. No distress.  HENT:  Head: Normocephalic and atraumatic.  Right Ear: External ear normal. Normal ear canal and TM Left Ear: External ear normal.  Normal ear canal and TM Mouth/Throat: Oropharynx is clear and moist.  Eyes: Conjunctivae and EOM are normal.  Neck: Neck supple. No tracheal deviation present. No thyromegaly present.  No carotid bruit  Cardiovascular: Normal rate, regular rhythm and normal heart sounds.   No murmur heard.  No edema.  Pulmonary/Chest: Effort normal and breath sounds normal. No respiratory distress. She has no wheezes. She has no rales.  Breast: deferred   Abdominal: Soft. She exhibits no distension. There is no tenderness.  Lymphadenopathy: She has no cervical adenopathy.  Skin: Skin is warm and dry. She is not diaphoretic.  Psychiatric: She has a normal mood and affect. Her behavior is normal.        Assessment & Plan:   Physical exam:  Screening blood work ordered Immunizations   discussed shingles, Tdap today , others up-to-date Colonoscopy   up-to-date Mammogram   up-to-date Gyn   up-to-date Eye exams   Up to date  Exercise   none Weight  Stressed weight loss Skin    no concerns  Substance abuse    none   See Problem List for Assessment and Plan of chronic medical problems.   FU in 6 months

## 2018-12-15 NOTE — Patient Instructions (Addendum)
Tests ordered today. Your results will be released to [facility] (or called to you) after review, usually within 72hours after test completion. If any changes need to be made, you will be notified at that same time.  All other Health Maintenance issues reviewed.   All recommended immunizations and age-appropriate screenings are up-to-date or discussed.  Tetanus immunization administered today.   Medications reviewed and updated.  Changes include :   none .   Please followup in 6 months    Health Maintenance, Female Adopting a healthy lifestyle and getting preventive care can go a long way to promote health and wellness. Talk with your health care provider about what schedule of regular examinations is right for you. This is a good chance for you to check in with your provider about disease prevention and staying healthy. In between checkups, there are plenty of things you can do on your own. Experts have done a lot of research about which lifestyle changes and preventive measures are most likely to keep you healthy. Ask your health care provider for more information. Weight and diet Eat a healthy diet  Be sure to include plenty of vegetables, fruits, low-fat dairy products, and lean protein.  Do not eat a lot of foods high in solid fats, added sugars, or salt.  Get regular exercise. This is one of the most important things you can do for your health. ? Most adults should exercise for at least 150 minutes each week. The exercise should increase your heart rate and make you sweat (moderate-intensity exercise). ? Most adults should also do strengthening exercises at least twice a week. This is in addition to the moderate-intensity exercise. Maintain a healthy weight  Body mass index (BMI) is a measurement that can be used to identify possible weight problems. It estimates body fat based on height and weight. Your health care provider can help determine your BMI and help you achieve or  maintain a healthy weight.  For females 63 years of age and older: ? A BMI below 18.5 is considered underweight. ? A BMI of 18.5 to 24.9 is normal. ? A BMI of 25 to 29.9 is considered overweight. ? A BMI of 30 and above is considered obese. Watch levels of cholesterol and blood lipids  You should start having your blood tested for lipids and cholesterol at 63 years of age, then have this test every 5 years.  You may need to have your cholesterol levels checked more often if: ? Your lipid or cholesterol levels are high. ? You are older than 63 years of age. ? You are at high risk for heart disease. Cancer screening Lung Cancer  Lung cancer screening is recommended for adults 50-28 years old who are at high risk for lung cancer because of a history of smoking.  A yearly low-dose CT scan of the lungs is recommended for people who: ? Currently smoke. ? Have quit within the past 15 years. ? Have at least a 30-pack-year history of smoking. A pack year is smoking an average of one pack of cigarettes a day for 1 year.  Yearly screening should continue until it has been 15 years since you quit.  Yearly screening should stop if you develop a health problem that would prevent you from having lung cancer treatment. Breast Cancer  Practice breast self-awareness. This means understanding how your breasts normally appear and feel.  It also means doing regular breast self-exams. Let your health care provider know about any changes, no matter  how small.  If you are in your 20s or 30s, you should have a clinical breast exam (CBE) by a health care provider every 1-3 years as part of a regular health exam.  If you are 53 or older, have a CBE every year. Also consider having a breast X-ray (mammogram) every year.  If you have a family history of breast cancer, talk to your health care provider about genetic screening.  If you are at high risk for breast cancer, talk to your health care provider  about having an MRI and a mammogram every year.  Breast cancer gene (BRCA) assessment is recommended for women who have family members with BRCA-related cancers. BRCA-related cancers include: ? Breast. ? Ovarian. ? Tubal. ? Peritoneal cancers.  Results of the assessment will determine the need for genetic counseling and BRCA1 and BRCA2 testing. Cervical Cancer Your health care provider may recommend that you be screened regularly for cancer of the pelvic organs (ovaries, uterus, and vagina). This screening involves a pelvic examination, including checking for microscopic changes to the surface of your cervix (Pap test). You may be encouraged to have this screening done every 3 years, beginning at age 49.  For women ages 76-65, health care providers may recommend pelvic exams and Pap testing every 3 years, or they may recommend the Pap and pelvic exam, combined with testing for human papilloma virus (HPV), every 5 years. Some types of HPV increase your risk of cervical cancer. Testing for HPV may also be done on women of any age with unclear Pap test results.  Other health care providers may not recommend any screening for nonpregnant women who are considered low risk for pelvic cancer and who do not have symptoms. Ask your health care provider if a screening pelvic exam is right for you.  If you have had past treatment for cervical cancer or a condition that could lead to cancer, you need Pap tests and screening for cancer for at least 20 years after your treatment. If Pap tests have been discontinued, your risk factors (such as having a new sexual partner) need to be reassessed to determine if screening should resume. Some women have medical problems that increase the chance of getting cervical cancer. In these cases, your health care provider may recommend more frequent screening and Pap tests. Colorectal Cancer  This type of cancer can be detected and often prevented.  Routine colorectal  cancer screening usually begins at 63 years of age and continues through 63 years of age.  Your health care provider may recommend screening at an earlier age if you have risk factors for colon cancer.  Your health care provider may also recommend using home test kits to check for hidden blood in the stool.  A small camera at the end of a tube can be used to examine your colon directly (sigmoidoscopy or colonoscopy). This is done to check for the earliest forms of colorectal cancer.  Routine screening usually begins at age 77.  Direct examination of the colon should be repeated every 5-10 years through 63 years of age. However, you may need to be screened more often if early forms of precancerous polyps or small growths are found. Skin Cancer  Check your skin from head to toe regularly.  Tell your health care provider about any new moles or changes in moles, especially if there is a change in a mole's shape or color.  Also tell your health care provider if you have a mole that is  larger than the size of a pencil eraser.  Always use sunscreen. Apply sunscreen liberally and repeatedly throughout the day.  Protect yourself by wearing long sleeves, pants, a wide-brimmed hat, and sunglasses whenever you are outside. Heart disease, diabetes, and high blood pressure  High blood pressure causes heart disease and increases the risk of stroke. High blood pressure is more likely to develop in: ? People who have blood pressure in the high end of the normal range (130-139/85-89 mm Hg). ? People who are overweight or obese. ? People who are African American.  If you are 24-25 years of age, have your blood pressure checked every 3-5 years. If you are 2 years of age or older, have your blood pressure checked every year. You should have your blood pressure measured twice-once when you are at a hospital or clinic, and once when you are not at a hospital or clinic. Record the average of the two  measurements. To check your blood pressure when you are not at a hospital or clinic, you can use: ? An automated blood pressure machine at a pharmacy. ? A home blood pressure monitor.  If you are between 97 years and 67 years old, ask your health care provider if you should take aspirin to prevent strokes.  Have regular diabetes screenings. This involves taking a blood sample to check your fasting blood sugar level. ? If you are at a normal weight and have a low risk for diabetes, have this test once every three years after 63 years of age. ? If you are overweight and have a high risk for diabetes, consider being tested at a younger age or more often. Preventing infection Hepatitis B  If you have a higher risk for hepatitis B, you should be screened for this virus. You are considered at high risk for hepatitis B if: ? You were born in a country where hepatitis B is common. Ask your health care provider which countries are considered high risk. ? Your parents were born in a high-risk country, and you have not been immunized against hepatitis B (hepatitis B vaccine). ? You have HIV or AIDS. ? You use needles to inject street drugs. ? You live with someone who has hepatitis B. ? You have had sex with someone who has hepatitis B. ? You get hemodialysis treatment. ? You take certain medicines for conditions, including cancer, organ transplantation, and autoimmune conditions. Hepatitis C  Blood testing is recommended for: ? Everyone born from [birth year] through 1965. ? Anyone with known risk factors for hepatitis C. Sexually transmitted infections (STIs)  You should be screened for sexually transmitted infections (STIs) including gonorrhea and chlamydia if: ? You are sexually active and are younger than 63 years of age. ? You are older than 63 years of age and your health care provider tells you that you are at risk for this type of infection. ? Your sexual activity has changed since you were last  screened and you are at an increased risk for chlamydia or gonorrhea. Ask your health care provider if you are at risk.  If you do not have HIV, but are at risk, it may be recommended that you take a prescription medicine daily to prevent HIV infection. This is called pre-exposure prophylaxis (PrEP). You are considered at risk if: ? You are sexually active and do not regularly use condoms or know the HIV status of your partner(s). ? You take drugs by injection. ? You are sexually active with a partner  who has HIV. Talk with your health care provider about whether you are at high risk of being infected with HIV. If you choose to begin PrEP, you should first be tested for HIV. You should then be tested every 3 months for as long as you are taking PrEP. Pregnancy  If you are premenopausal and you may become pregnant, ask your health care provider about preconception counseling.  If you may become pregnant, take 400 to 800 micrograms (mcg) of folic acid every day.  If you want to prevent pregnancy, talk to your health care provider about birth control (contraception). Osteoporosis and menopause  Osteoporosis is a disease in which the bones lose minerals and strength with aging. This can result in serious bone fractures. Your risk for osteoporosis can be identified using a bone density scan.  If you are 17 years of age or older, or if you are at risk for osteoporosis and fractures, ask your health care provider if you should be screened.  Ask your health care provider whether you should take a calcium or vitamin D supplement to lower your risk for osteoporosis.  Menopause may have certain physical symptoms and risks.  Hormone replacement therapy may reduce some of these symptoms and risks. Talk to your health care provider about whether hormone replacement therapy is right for you. Follow these instructions at home:  Schedule regular health, dental, and eye exams.  Stay current with your  immunizations.  Do not use any tobacco products including cigarettes, chewing tobacco, or electronic cigarettes.  If you are pregnant, do not drink alcohol.  If you are breastfeeding, limit how much and how often you drink alcohol.  Limit alcohol intake to no more than 1 drink per day for nonpregnant women. One drink equals 12 ounces of beer, 5 ounces of wine, or 1 ounces of hard liquor.  Do not use street drugs.  Do not share needles.  Ask your health care provider for help if you need support or information about quitting drugs.  Tell your health care provider if you often feel depressed.  Tell your health care provider if you have ever been abused or do not feel safe at home. This information is not intended to replace advice given to you by your health care provider. Make sure you discuss any questions you have with your health care provider. Document Released: 12/25/2010 Document Revised: 11/17/2015 Document Reviewed: 03/15/2015 Elsevier Interactive Patient Education  2019 [employer].

## 2018-12-16 ENCOUNTER — Ambulatory Visit (INDEPENDENT_AMBULATORY_CARE_PROVIDER_SITE_OTHER): Payer: Federal, State, Local not specified - PPO | Admitting: Internal Medicine

## 2018-12-16 ENCOUNTER — Other Ambulatory Visit (INDEPENDENT_AMBULATORY_CARE_PROVIDER_SITE_OTHER): Payer: Federal, State, Local not specified - PPO

## 2018-12-16 ENCOUNTER — Encounter: Payer: Self-pay | Admitting: Internal Medicine

## 2018-12-16 ENCOUNTER — Other Ambulatory Visit: Payer: Self-pay

## 2018-12-16 VITALS — BP 142/90 | HR 57 | Temp 97.9°F | Resp 16 | Ht 64.0 in | Wt 277.1 lb

## 2018-12-16 DIAGNOSIS — R7303 Prediabetes: Secondary | ICD-10-CM | POA: Diagnosis not present

## 2018-12-16 DIAGNOSIS — E559 Vitamin D deficiency, unspecified: Secondary | ICD-10-CM

## 2018-12-16 DIAGNOSIS — Z0000 Encounter for general adult medical examination without abnormal findings: Secondary | ICD-10-CM | POA: Diagnosis not present

## 2018-12-16 DIAGNOSIS — Z23 Encounter for immunization: Secondary | ICD-10-CM

## 2018-12-16 DIAGNOSIS — I10 Essential (primary) hypertension: Secondary | ICD-10-CM

## 2018-12-16 LAB — CBC WITH DIFFERENTIAL/PLATELET
Basophils Absolute: 0 10*3/uL (ref 0.0–0.1)
Basophils Relative: 0.4 % (ref 0.0–3.0)
Eosinophils Absolute: 0.3 10*3/uL (ref 0.0–0.7)
Eosinophils Relative: 3.7 % (ref 0.0–5.0)
HCT: 42.6 % (ref 36.0–46.0)
Hemoglobin: 13.7 g/dL (ref 12.0–15.0)
Lymphocytes Relative: 29.2 % (ref 12.0–46.0)
Lymphs Abs: 2.6 10*3/uL (ref 0.7–4.0)
MCHC: 32.1 g/dL (ref 30.0–36.0)
MCV: 88.9 fl (ref 78.0–100.0)
Monocytes Absolute: 0.8 10*3/uL (ref 0.1–1.0)
Monocytes Relative: 9.3 % (ref 3.0–12.0)
Neutro Abs: 5.1 10*3/uL (ref 1.4–7.7)
Neutrophils Relative %: 57.4 % (ref 43.0–77.0)
Platelets: 271 10*3/uL (ref 150.0–400.0)
RBC: 4.79 Mil/uL (ref 3.87–5.11)
RDW: 14.7 % (ref 11.5–15.5)
WBC: 8.9 10*3/uL (ref 4.0–10.5)

## 2018-12-16 LAB — COMPREHENSIVE METABOLIC PANEL
ALT: 14 U/L (ref 0–35)
AST: 16 U/L (ref 0–37)
Albumin: 4.1 g/dL (ref 3.5–5.2)
Alkaline Phosphatase: 93 U/L (ref 39–117)
BUN: 18 mg/dL (ref 6–23)
CO2: 28 mEq/L (ref 19–32)
Calcium: 9.3 mg/dL (ref 8.4–10.5)
Chloride: 103 mEq/L (ref 96–112)
Creatinine, Ser: 0.82 mg/dL (ref 0.40–1.20)
GFR: 85.28 mL/min (ref >60.00)
Glucose, Bld: 73 mg/dL (ref 70–99)
Potassium: 4 mEq/L (ref 3.5–5.1)
Sodium: 137 mEq/L (ref 135–145)
Total Bilirubin: 0.4 mg/dL (ref 0.2–1.2)
Total Protein: 7.2 g/dL (ref 6.0–8.3)

## 2018-12-16 LAB — HEMOGLOBIN A1C: Hgb A1c MFr Bld: 6.4 % (ref 4.6–6.5)

## 2018-12-16 LAB — LIPID PANEL
Cholesterol: 176 mg/dL (ref 0–200)
HDL: 48.2 mg/dL
LDL Cholesterol: 113 mg/dL — ABNORMAL HIGH (ref 0–99)
NonHDL: 127.76
Total CHOL/HDL Ratio: 4
Triglycerides: 75 mg/dL (ref 0.0–149.0)
VLDL: 15 mg/dL (ref 0.0–40.0)

## 2018-12-16 LAB — TSH: TSH: 2.49 u[IU]/mL (ref 0.35–4.50)

## 2018-12-16 MED ORDER — AMLODIPINE BESYLATE 5 MG PO TABS: 5.0000 mg | ORAL_TABLET | Freq: Every day | ORAL | 1 refills | Status: DC

## 2018-12-16 MED ORDER — METOPROLOL SUCCINATE ER 100 MG PO TB24: 100.0000 mg | ORAL_TABLET | Freq: Every day | ORAL | 1 refills | Status: DC

## 2018-12-16 NOTE — Assessment & Plan Note (Addendum)
BP Readings from Last 3 Encounters:  12/16/18 (!) 142/90  07/03/18 (!) 125/93  06/23/18 (!) 152/91   BP [place] - somewhat controlled - should be lower  She will increase exercise and work on weight loss Fu in 6 months cmp

## 2018-12-16 NOTE — Assessment & Plan Note (Signed)
Taking vitamin d daily 

## 2018-12-16 NOTE — Assessment & Plan Note (Signed)
Check a1c Low sugar / carb diet Stressed regular exercise   

## 2018-12-16 NOTE — Addendum Note (Signed)
Addended by: Delice Bison E on: 12/16/2018 02:07 PM   Modules accepted: Orders

## 2018-12-29 DIAGNOSIS — M25562 Pain in left knee: Secondary | ICD-10-CM | POA: Diagnosis not present

## 2019-03-03 ENCOUNTER — Other Ambulatory Visit: Payer: Self-pay | Admitting: Internal Medicine

## 2019-03-03 ENCOUNTER — Ambulatory Visit
Admission: RE | Admit: 2019-03-03 | Discharge: 2019-03-03 | Disposition: A | Discharge status: Home / Self Care | Payer: Federal, State, Local not specified - PPO | Source: Ambulatory Visit | Attending: Internal Medicine | Admitting: Internal Medicine

## 2019-03-03 ENCOUNTER — Other Ambulatory Visit: Payer: Self-pay

## 2019-03-03 DIAGNOSIS — Z1231 Encounter for screening mammogram for malignant neoplasm of breast: Secondary | ICD-10-CM | POA: Diagnosis not present

## 2019-06-07 NOTE — Patient Instructions (Addendum)
Monitor your BP at home - if should be less than 140/90.   Tests ordered today. Your results will be released to [facility] (or called to you) after review.  If any changes need to be made, you will be notified at that same time.   Medications reviewed and updated.  Changes include :   none  Your prescription(s) have been submitted to your pharmacy. Please take as directed and contact our office if you believe you are having problem(s) with the medication(s).  A referral was ordered for neurology and pulmonary  Please followup in 6 months

## 2019-06-07 NOTE — Progress Notes (Signed)
Subjective:    Patient ID: [name], female    DOB: July 10, 1955, 63 y.o.   MRN: [id number]  HPI The patient is here for follow up.  She is not exercising regularly.    Hypertension: She is taking her medication daily. She is compliant with a low sodium diet.  She denies chest pain, palpitations, shortness of breath and regular headaches. She does not monitor her blood pressure at home.    Prediabetes:  She is compliant with a low sugar/carbohydrate diet.  She is not exercising regularly.   Dry cough:  She has had a dry cough  - sometimes she feels like she needs to bring up phlegm.  There is no pattern to the cough.  It is not related to eating or laying down.  The cough may not necessarily occur every day.  Sometimes she notices it when she is sitting at the computer and she feels like there is a dryness in her throat.  She thinks drinking something may help.  She does not cough at night.  She is taking an allergy medication daily.  She denies any heartburn.  She denies shortness of breath, wheezing or fevers.  She snores and gasps for breath.  She denies feeling tired when she wakes up or during the day.  She has decreased hearing and would like to have that evaluated.  Medications and allergies reviewed with patient and updated if appropriate.  Patient Active Problem List   Diagnosis Date Noted  . Left rotator cuff tear 07/02/2018  . Right rotator cuff tear 10/18/2017  . Complete rotator cuff tear 10/18/2017  . Chronic pain of both shoulders 04/17/2017  . Bilateral primary osteoarthritis of knee 04/11/2016  . Alopecia areata 09/24/2013  . Vitamin D deficiency 10/01/2012  . Pre-diabetes 03/24/2009  . Essential hypertension 02/26/2008    Current Outpatient Medications on File Prior to Visit  Medication Sig Dispense Refill  . aspirin 81 MG tablet Take 81 mg by mouth daily.    . Biotin 5000 MCG CAPS Take 1 capsule by mouth daily.     . Cholecalciferol (VITAMIN D-3) 1000  UNITS CAPS Take 1 capsule by mouth daily.    Marland Kitchen loratadine (CLARITIN) 10 MG tablet Take 10 mg by mouth daily.     No current facility-administered medications on file prior to visit.    Past Medical History:  Diagnosis Date  . Alopecia   . Herpes zoster 1992   posterior thorax  . Hyperlipidemia 2006    LDL 104, HDL 50  . Hypertension   . OA (osteoarthritis) of knee    Bilateral  . Other abnormal glucose   . Ovarian cyst    Small  . Pre-diabetes   . Uterine fibroid   . Vitamin D deficiency     Past Surgical History:  Procedure Laterality Date  . COLONOSCOPY  2007, 07/25/2016   negative  . CYSTECTOMY     peri rectal  . FOOT SURGERY     bilaterally  . INCONTINENCE SURGERY  2005   Dr Quincy Simmonds  . LIPOMA EXCISION  2011    L chest , Dr Georgette Dover  . SHOULDER ARTHROSCOPY WITH ROTATOR CUFF REPAIR AND SUBACROMIAL DECOMPRESSION Right 10/18/2017   Procedure: Right shoulder arthroscopy, subacromial decompression, labrial debridement mini open rotator cuff repair;  Surgeon: Susa Day, MD;  Location: [facility];  Service: Orthopedics;  Laterality: Right;  120 mins  . SHOULDER ARTHROSCOPY WITH ROTATOR CUFF REPAIR AND SUBACROMIAL DECOMPRESSION Left 07/02/2018  Procedure: Left shoulder arthroscopy, subacromial decompression, mini open rotator cuff repair,;  Surgeon: Susa Day, MD;  Location: [facility];  Service: Orthopedics;  Laterality: Left;  90 mins    Social History   Socioeconomic History  . Marital status: Single    Spouse name: Not on file  . Number of children: Not on file  . Years of education: Not on file  . Highest education level: Not on file  Occupational History  . Not on file  Tobacco Use  . Smoking status: Former Smoker    Quit date: 06/25/1980    Years since quitting: 38.9  . Smokeless tobacco: Never Used  . Tobacco comment: smoked [age] , up to 1/2 ppd  Substance and Sexual Activity  . Alcohol use: Not Currently  . Drug use: No  . Sexual activity: Yes    Birth  control/protection: Post-menopausal  Other Topics Concern  . Not on file  Social History Narrative   No regular exercise   Social Determinants of Health   Financial Resource Strain:   . Difficulty of Paying Living Expenses: Not on file  Food Insecurity:   . Worried About Charity fundraiser in the Last Year: Not on file  . Ran Out of Food in the Last Year: Not on file  Transportation Needs:   . Lack of Transportation (Medical): Not on file  . Lack of Transportation (Non-Medical): Not on file  Physical Activity:   . Days of Exercise per Week: Not on file  . Minutes of Exercise per Session: Not on file  Stress:   . Feeling of Stress : Not on file  Social Connections:   . Frequency of Communication with Friends and Family: Not on file  . Frequency of Social Gatherings with Friends and Family: Not on file  . Attends Religious Services: Not on file  . Active Member of Clubs or Organizations: Not on file  . Attends Archivist Meetings: Not on file  . Marital Status: Not on file    Family History  Problem Relation Age of Onset  . Diabetes Mother   . Stroke Father 10  . Hypertension Father   . Hypertension Sister   . Hypertension Maternal Aunt   . Diabetes Maternal Aunt   . Cancer Neg Hx   . Colon cancer Neg Hx   . Breast cancer Neg Hx     Review of Systems  Constitutional: Negative for chills and fever.  HENT: Positive for postnasal drip.   Respiratory: Positive for cough. Negative for shortness of breath and wheezing.   Cardiovascular: Positive for leg swelling. Negative for chest pain and palpitations.  Gastrointestinal:       No gerd  Neurological: Negative for light-headedness and headaches.       Objective:   Vitals:   06/08/19 1338  BP: (!) 152/74  Pulse: 74  Resp: 18  Temp: 98 F (36.7 C)  SpO2: 96%   BP Readings from Last 3 Encounters:  06/08/19 (!) 152/74  12/16/18 (!) 142/90  07/03/18 (!) 125/93   Wt Readings from Last 3 Encounters:    06/08/19 280 lb (127 kg)  12/16/18 277 lb 1.9 oz (125.7 kg)  07/02/18 274 lb (124.3 kg)   Body mass index is 48.06 kg/m.   Physical Exam    Constitutional: Appears well-developed and well-nourished. No distress.  HENT:  Head: Normocephalic and atraumatic.  Neck: Neck supple. No tracheal deviation present. No thyromegaly present.  No cervical lymphadenopathy Cardiovascular: Normal rate,  regular rhythm and normal heart sounds.   No murmur heard. No carotid bruit .  No edema Pulmonary/Chest: Effort normal and breath sounds normal. No respiratory distress. No has no wheezes. No rales.  Skin: Skin is warm and dry. Not diaphoretic.  Psychiatric: Normal mood and affect. Behavior is normal.      Assessment & Plan:    See Problem List for Assessment and Plan of chronic medical problems.   This visit occurred during the SARS-CoV-2 public health emergency.  Safety protocols were in place, including screening questions prior to the visit, additional usage of staff PPE, and extensive cleaning of exam room while observing appropriate contact time as indicated for disinfecting solutions.

## 2019-06-08 ENCOUNTER — Encounter: Payer: Self-pay | Admitting: Internal Medicine

## 2019-06-08 ENCOUNTER — Other Ambulatory Visit: Payer: Self-pay

## 2019-06-08 ENCOUNTER — Ambulatory Visit (INDEPENDENT_AMBULATORY_CARE_PROVIDER_SITE_OTHER): Payer: Federal, State, Local not specified - PPO | Admitting: Internal Medicine

## 2019-06-08 ENCOUNTER — Other Ambulatory Visit (INDEPENDENT_AMBULATORY_CARE_PROVIDER_SITE_OTHER): Payer: Federal, State, Local not specified - PPO

## 2019-06-08 VITALS — BP 152/74 | HR 74 | Temp 98.0°F | Resp 18 | Ht 64.0 in | Wt 280.0 lb

## 2019-06-08 DIAGNOSIS — R0683 Snoring: Secondary | ICD-10-CM | POA: Diagnosis not present

## 2019-06-08 DIAGNOSIS — H9190 Unspecified hearing loss, unspecified ear: Secondary | ICD-10-CM

## 2019-06-08 DIAGNOSIS — I10 Essential (primary) hypertension: Secondary | ICD-10-CM | POA: Diagnosis not present

## 2019-06-08 DIAGNOSIS — R05 Cough: Secondary | ICD-10-CM | POA: Diagnosis not present

## 2019-06-08 DIAGNOSIS — R7303 Prediabetes: Secondary | ICD-10-CM

## 2019-06-08 DIAGNOSIS — R059 Cough, unspecified: Secondary | ICD-10-CM | POA: Insufficient documentation

## 2019-06-08 DIAGNOSIS — Z23 Encounter for immunization: Secondary | ICD-10-CM | POA: Diagnosis not present

## 2019-06-08 LAB — COMPREHENSIVE METABOLIC PANEL
ALT: 12 U/L (ref 0–35)
AST: 15 U/L (ref 0–37)
Albumin: 4 g/dL (ref 3.5–5.2)
Alkaline Phosphatase: 91 U/L (ref 39–117)
BUN: 19 mg/dL (ref 6–23)
CO2: 26 mEq/L (ref 19–32)
Calcium: 9.5 mg/dL (ref 8.4–10.5)
Chloride: 108 mEq/L (ref 96–112)
Creatinine, Ser: 0.89 mg/dL (ref 0.40–1.20)
GFR: 77.46 mL/min (ref >60.00)
Glucose, Bld: 81 mg/dL (ref 70–99)
Potassium: 4 mEq/L (ref 3.5–5.1)
Sodium: 140 mEq/L (ref 135–145)
Total Bilirubin: 0.3 mg/dL (ref 0.2–1.2)
Total Protein: 7.3 g/dL (ref 6.0–8.3)

## 2019-06-08 LAB — HEMOGLOBIN A1C: Hgb A1c MFr Bld: 6.2 % (ref 4.6–6.5)

## 2019-06-08 MED ORDER — METOPROLOL SUCCINATE ER 100 MG PO TB24: 100.0000 mg | ORAL_TABLET | Freq: Every day | ORAL | 1 refills | Status: DC

## 2019-06-08 MED ORDER — AMLODIPINE BESYLATE 5 MG PO TABS: 5.0000 mg | ORAL_TABLET | Freq: Every day | ORAL | 1 refills | Status: DC

## 2019-06-08 NOTE — Assessment & Plan Note (Signed)
Check a1c Low sugar / carb diet Stressed regular exercise Stressed weight loss

## 2019-06-08 NOTE — Assessment & Plan Note (Signed)
States snoring and gasping for air in the middle the night Denies fatigue or nonrefreshing sleep She is obese and possibly high risk for sleep apnea Will refer to neurology for further evaluation

## 2019-06-08 NOTE — Assessment & Plan Note (Signed)
Dry, intermittent cough that does not occur on a daily basis Denies GERD Has some allergies, but is taking Claritin on a daily basis She does think dry throat is a potential cause-advised increase fluids She would like this further evaluated Will refer to pulmonary

## 2019-06-08 NOTE — Addendum Note (Signed)
Addended by: Delice Bison E on: 06/08/2019 04:51 PM   Modules accepted: Orders

## 2019-06-08 NOTE — Assessment & Plan Note (Signed)
Refer to audiology.

## 2019-06-08 NOTE — Assessment & Plan Note (Signed)
Blood pressure elevated here today She will start monitoring this at home regularly to see if it truly is elevated or not Stressed the importance of good blood pressure control Stressed regular exercise and weight loss Continue amlodipine and metoprolol at current dose CMP

## 2019-06-09 ENCOUNTER — Encounter: Payer: Self-pay | Admitting: Internal Medicine

## 2019-06-10 ENCOUNTER — Other Ambulatory Visit: Payer: Self-pay

## 2019-06-10 ENCOUNTER — Ambulatory Visit (INDEPENDENT_AMBULATORY_CARE_PROVIDER_SITE_OTHER): Payer: Federal, State, Local not specified - PPO | Admitting: Internal Medicine

## 2019-06-10 ENCOUNTER — Encounter: Payer: Self-pay | Admitting: Internal Medicine

## 2019-06-10 ENCOUNTER — Telehealth: Payer: Self-pay | Admitting: Internal Medicine

## 2019-06-10 ENCOUNTER — Other Ambulatory Visit: Payer: Federal, State, Local not specified - PPO

## 2019-06-10 VITALS — BP 144/80 | HR 68 | Temp 98.1°F | Resp 18 | Ht 64.0 in | Wt 280.0 lb

## 2019-06-10 DIAGNOSIS — R300 Dysuria: Secondary | ICD-10-CM | POA: Diagnosis not present

## 2019-06-10 DIAGNOSIS — N3000 Acute cystitis without hematuria: Secondary | ICD-10-CM

## 2019-06-10 LAB — POCT URINALYSIS DIPSTICK
Bilirubin, UA: NEGATIVE
Glucose, UA: NEGATIVE
Ketones, UA: NEGATIVE
Nitrite, UA: NEGATIVE
Protein, UA: POSITIVE — AB
Spec Grav, UA: >=1.03 — AB (ref 1.010–1.025)
Urobilinogen, UA: NEGATIVE E.U./dL — AB
pH, UA: 6 (ref 5.0–8.0)

## 2019-06-10 MED ORDER — NITROFURANTOIN MONOHYD MACRO 100 MG PO CAPS: 100.0000 mg | ORAL_CAPSULE | Freq: Two times a day (BID) | ORAL | 0 refills | Status: DC

## 2019-06-10 NOTE — Progress Notes (Signed)
Subjective:    Patient ID: [name], female    DOB: 1956/01/13, 63 y.o.   MRN: [id number]  HPI The patient is here for an acute visit.   Dysuria:  It started this morning.  She had urgency and there is little urine each time she urinates.  There is discomfort when she urinates.  She denies any blood in the urine, change in odor or color.  She has not had any fevers, abdominal pain, nausea or back pain.  Her last urinary tract infection was years ago.  She is not sexually active.  Recently she has been drinking more soda, but does drink water throughout the day  Medications and allergies reviewed with patient and updated if appropriate.  Patient Active Problem List   Diagnosis Date Noted  . Decreased hearing 06/08/2019  . Snoring 06/08/2019  . Cough 06/08/2019  . Left rotator cuff tear 07/02/2018  . Right rotator cuff tear 10/18/2017  . Complete rotator cuff tear 10/18/2017  . Chronic pain of both shoulders 04/17/2017  . Bilateral primary osteoarthritis of knee 04/11/2016  . Alopecia areata 09/24/2013  . Vitamin D deficiency 10/01/2012  . Pre-diabetes 03/24/2009  . Essential hypertension 02/26/2008    Current Outpatient Medications on File Prior to Visit  Medication Sig Dispense Refill  . amLODipine (NORVASC) 5 MG tablet Take 1 tablet (5 mg total) by mouth daily. 90 tablet 1  . aspirin 81 MG tablet Take 81 mg by mouth daily.    . Biotin 5000 MCG CAPS Take 1 capsule by mouth daily.     . Cholecalciferol (VITAMIN D-3) 1000 UNITS CAPS Take 1 capsule by mouth daily.    Marland Kitchen loratadine (CLARITIN) 10 MG tablet Take 10 mg by mouth daily.    . metoprolol succinate (TOPROL-XL) 100 MG 24 hr tablet Take 1 tablet (100 mg total) by mouth daily. Take with or immediately following a meal. 90 tablet 1   No current facility-administered medications on file prior to visit.    Past Medical History:  Diagnosis Date  . Alopecia   . Herpes zoster 1992   posterior thorax  . Hyperlipidemia  2006    LDL 104, HDL 50  . Hypertension   . OA (osteoarthritis) of knee    Bilateral  . Other abnormal glucose   . Ovarian cyst    Small  . Pre-diabetes   . Uterine fibroid   . Vitamin D deficiency     Past Surgical History:  Procedure Laterality Date  . COLONOSCOPY  2007, 07/25/2016   negative  . CYSTECTOMY     peri rectal  . FOOT SURGERY     bilaterally  . INCONTINENCE SURGERY  2005   Dr Quincy Simmonds  . LIPOMA EXCISION  2011    L chest , Dr Georgette Dover  . SHOULDER ARTHROSCOPY WITH ROTATOR CUFF REPAIR AND SUBACROMIAL DECOMPRESSION Right 10/18/2017   Procedure: Right shoulder arthroscopy, subacromial decompression, labrial debridement mini open rotator cuff repair;  Surgeon: Susa Day, MD;  Location: [facility];  Service: Orthopedics;  Laterality: Right;  120 mins  . SHOULDER ARTHROSCOPY WITH ROTATOR CUFF REPAIR AND SUBACROMIAL DECOMPRESSION Left 07/02/2018   Procedure: Left shoulder arthroscopy, subacromial decompression, mini open rotator cuff repair,;  Surgeon: Susa Day, MD;  Location: [facility];  Service: Orthopedics;  Laterality: Left;  90 mins    Social History   Socioeconomic History  . Marital status: Single    Spouse name: Not on file  . Number of children: Not on file  .  Years of education: Not on file  . Highest education level: Not on file  Occupational History  . Not on file  Tobacco Use  . Smoking status: Former Smoker    Quit date: 06/25/1980    Years since quitting: 38.9  . Smokeless tobacco: Never Used  . Tobacco comment: smoked 16-25 , up to 1/2 ppd  Substance and Sexual Activity  . Alcohol use: Not Currently  . Drug use: No  . Sexual activity: Yes    Birth control/protection: Post-menopausal  Other Topics Concern  . Not on file  Social History Narrative   No regular exercise   Social Determinants of Health   Financial Resource Strain:   . Difficulty of Paying Living Expenses: Not on file  Food Insecurity:   . Worried About Charity fundraiser in the  Last Year: Not on file  . Ran Out of Food in the Last Year: Not on file  Transportation Needs:   . Lack of Transportation (Medical): Not on file  . Lack of Transportation (Non-Medical): Not on file  Physical Activity:   . Days of Exercise per Week: Not on file  . Minutes of Exercise per Session: Not on file  Stress:   . Feeling of Stress : Not on file  Social Connections:   . Frequency of Communication with Friends and Family: Not on file  . Frequency of Social Gatherings with Friends and Family: Not on file  . Attends Religious Services: Not on file  . Active Member of Clubs or Organizations: Not on file  . Attends Archivist Meetings: Not on file  . Marital Status: Not on file    Family History  Problem Relation Age of Onset  . Diabetes Mother   . Stroke Father 68  . Hypertension Father   . Hypertension Sister   . Hypertension Maternal Aunt   . Diabetes Maternal Aunt   . Cancer Neg Hx   . Colon cancer Neg Hx   . Breast cancer Neg Hx     Review of Systems  Constitutional: Negative for fever.  Gastrointestinal: Negative for abdominal pain and nausea.  Genitourinary: Positive for dysuria, frequency and urgency. Negative for hematuria.       No change in urine odor or color  Musculoskeletal: Negative for back pain.       Objective:   Vitals:   06/10/19 1517  BP: (!) 144/80  Pulse: 68  Resp: 18  Temp: 98.1 F (36.7 C)  SpO2: 99%   BP Readings from Last 3 Encounters:  06/10/19 (!) 144/80  06/08/19 (!) 152/74  12/16/18 (!) 142/90   Wt Readings from Last 3 Encounters:  06/10/19 280 lb (127 kg)  06/08/19 280 lb (127 kg)  12/16/18 277 lb 1.9 oz (125.7 kg)   Body mass index is 48.06 kg/m.   Physical Exam Constitutional:      Appearance: Normal appearance.  HENT:     Head: Normocephalic and atraumatic.  Abdominal:     General: There is no distension.     Palpations: Abdomen is soft.     Tenderness: There is abdominal tenderness (Minimal  suprapubic tenderness/pressure). There is no right CVA tenderness, left CVA tenderness, guarding or rebound.  Skin:    General: Skin is warm and dry.  Neurological:     Mental Status: She is alert.            Assessment & Plan:    See Problem List for Assessment and Plan of chronic  medical problems.   This visit occurred during the SARS-CoV-2 public health emergency.  Safety protocols were in place, including screening questions prior to the visit, additional usage of staff PPE, and extensive cleaning of exam room while observing appropriate contact time as indicated for disinfecting solutions.

## 2019-06-10 NOTE — Assessment & Plan Note (Signed)
Urine dip consistent with UTI Will send urine for culture Take the antibiotic as prescribed.   Take tylenol if needed.   Increase your water intake.  Call if no improvement   

## 2019-06-10 NOTE — Patient Instructions (Signed)
Take the antibiotic as prescribed.  Take tylenol if needed.     Increase your water intake.   Call if no improvement     Urinary Tract Infection, Adult A urinary tract infection (UTI) is an infection of any part of the urinary tract, which includes the kidneys, ureters, bladder, and urethra. These organs make, store, and get rid of urine in the body. UTI can be a bladder infection (cystitis) or kidney infection (pyelonephritis). What are the causes? This infection may be caused by fungi, viruses, or bacteria. Bacteria are the most common cause of UTIs. This condition can also be caused by repeated incomplete emptying of the bladder during urination. What increases the risk? This condition is more likely to develop if:  You ignore your need to urinate or hold urine for long periods of time.  You do not empty your bladder completely during urination.  You wipe back to front after urinating or having a bowel movement, if you are female.  You are uncircumcised, if you are female.  You are constipated.  You have a urinary catheter that stays in place (indwelling).  You have a weak defense (immune) system.  You have a medical condition that affects your bowels, kidneys, or bladder.  You have diabetes.  You take antibiotic medicines frequently or for long periods of time, and the antibiotics no longer work well against certain types of infections (antibiotic resistance).  You take medicines that irritate your urinary tract.  You are exposed to chemicals that irritate your urinary tract.  You are female.  What are the signs or symptoms? Symptoms of this condition include:  Fever.  Frequent urination or passing small amounts of urine frequently.  Needing to urinate urgently.  Pain or burning with urination.  Urine that smells bad or unusual.  Cloudy urine.  Pain in the lower abdomen or back.  Trouble urinating.  Blood in the urine.  Vomiting or being less hungry than  normal.  Diarrhea or abdominal pain.  Vaginal discharge, if you are female.  How is this diagnosed? This condition is diagnosed with a medical history and physical exam. You will also need to provide a urine sample to test your urine. Other tests may be done, including:  Blood tests.  Sexually transmitted disease (STD) testing.  If you have had more than one UTI, a cystoscopy or imaging studies may be done to determine the cause of the infections. How is this treated? Treatment for this condition often includes a combination of two or more of the following:  Antibiotic medicine.  Other medicines to treat less common causes of UTI.  Over-the-counter medicines to treat pain.  Drinking enough water to stay hydrated.  Follow these instructions at home:  Take over-the-counter and prescription medicines only as told by your health care provider.  If you were prescribed an antibiotic, take it as told by your health care provider. Do not stop taking the antibiotic even if you start to feel better.  Avoid alcohol, caffeine, tea, and carbonated beverages. They can irritate your bladder.  Drink enough fluid to keep your urine clear or pale yellow.  Keep all follow-up visits as told by your health care provider. This is important.  Make sure to: ? Empty your bladder often and completely. Do not hold urine for long periods of time. ? Empty your bladder before and after sex. ? Wipe from front to back after a bowel movement if you are female. Use each tissue one time when you   wipe. Contact a health care provider if:  You have back pain.  You have a fever.  You feel nauseous or vomit.  Your symptoms do not get better after 3 days.  Your symptoms go away and then return. Get help right away if:  You have severe back pain or lower abdominal pain.  You are vomiting and cannot keep down any medicines or water. This information is not intended to replace advice given to you by  your health care provider. Make sure you discuss any questions you have with your health care provider. Document Released: 03/21/2005 Document Revised: 11/23/2015 Document Reviewed: 05/02/2015 Elsevier Interactive Patient Education  2018 Else[employer] Inc[employer]   

## 2019-06-10 NOTE — Telephone Encounter (Signed)
Copied from [place] [phone]. Topic: General - Other >> Jun 10, 2019  1:17 PM Keene Breath wrote: Reason for CRM: Patient believes she has a UTI and would like some medicine called in at her local pharmacy at [facility] on [address]  CB# [phone]

## 2019-06-10 NOTE — Telephone Encounter (Signed)
Called and schedule patient for an in office visit with Dr Quay Burow for today at 3:30pm.

## 2019-06-12 ENCOUNTER — Encounter: Payer: Self-pay | Admitting: Internal Medicine

## 2019-06-12 LAB — URINE CULTURE
MICRO NUMBER:: 1204095
SPECIMEN QUALITY:: ADEQUATE

## 2019-06-24 ENCOUNTER — Other Ambulatory Visit: Payer: Self-pay

## 2019-06-24 ENCOUNTER — Encounter: Payer: Self-pay | Admitting: Neurology

## 2019-06-24 ENCOUNTER — Ambulatory Visit (INDEPENDENT_AMBULATORY_CARE_PROVIDER_SITE_OTHER): Payer: Federal, State, Local not specified - PPO | Admitting: Neurology

## 2019-06-24 VITALS — BP 141/96 | HR 75 | Temp 97.6°F | Ht 65.0 in | Wt 281.5 lb

## 2019-06-24 DIAGNOSIS — G4730 Sleep apnea, unspecified: Secondary | ICD-10-CM

## 2019-06-24 DIAGNOSIS — R0683 Snoring: Secondary | ICD-10-CM | POA: Diagnosis not present

## 2019-06-24 DIAGNOSIS — E662 Morbid (severe) obesity with alveolar hypoventilation: Secondary | ICD-10-CM

## 2019-06-24 DIAGNOSIS — I10 Essential (primary) hypertension: Secondary | ICD-10-CM | POA: Diagnosis not present

## 2019-06-24 DIAGNOSIS — R7303 Prediabetes: Secondary | ICD-10-CM | POA: Diagnosis not present

## 2019-06-24 DIAGNOSIS — E66813 Obesity, class 3: Secondary | ICD-10-CM

## 2019-06-24 DIAGNOSIS — Z6841 Body Mass Index (BMI) 40.0 and over, adult: Secondary | ICD-10-CM

## 2019-06-24 NOTE — Progress Notes (Signed)
SLEEP MEDICINE CLINIC    Provider:  Larey Seat, MD  Primary Care Physician:  Binnie Rail, MD [place] [address]     Referring Provider: Binnie Rail, Md [place],  [place] [address]          Chief Complaint according to patient   Patient presents with:    . New Patient (Initial Visit)     Patient stipulates that her family has informed her that she snores/gasps      HISTORY OF PRESENT ILLNESS:  [name] is a 63  year old  African American female patient and seen on 06/24/2019 upon referral by Dr Quay Burow for a sleep medicine consultation.  The patient has high blood pressure and gained weight during the pandemic.  Chief concern according to patient :  "My family witnessed my snoring, and reported apnea".   I have the pleasure of seeing [name] today, a left-handed African American female with a possible sleep disorder. She  has a past medical history of Alopecia, Herpes zoster (1992), Hyperlipidemia (2006), Hypertension, OA (osteoarthritis) of knee, Other abnormal glucose, Ovarian cyst, Pre-diabetes, Uterine fibroid, and Vitamin D deficiency.  Severe/ morbid Obesity at BMI of 46.8 kg/m2.       Sleep relevant medical history: Nocturia : none , no Tonsillectomy, no cervical spine trauma, or surgery, no ENT repair.  Family medical /sleep history: no other family member on CPAP with OSA. Sister was tested and was negative, per her report.    Social history:  Patient is retired from Development worker, community and lives in a household with 1 person- a nice lives with her.   Family status is single .  The patient used to work in shifts( Presenter, broadcasting,) Pets are not present. Tobacco use : quit 1982, she stopped after only 4 years of smoking. ETOH use : none , Caffeine intake in form of Coffee( 1 cup in AM ) Soda( 1 can a day or less) Tea ( none ) and no energy drinks. No regular exercise  .   Hobbies : retail therapy     Sleep habits are as follows:   The patient's dinner time is between 6- 9 PM. The patient goes to bed at 1 AM and goes to sleep immediately- she usually takes a hot shower before, the  bedroom is cool , quiet and dark- continues to sleep for 7-8 hours, wakes for one bathroom breaks,.The preferred sleep position is laterally, with the support of 2 pillows.  Dreams are reportedly rare.  9 AM is the usual rise time. The patient wakes up spontaneously at 9 AM.  She reports not feeling refreshed or restored in AM, with symptoms such as dry mouth,  and residual fatigue. Naps are taken rarely.   Review of Systems: Out of a complete 14 system review, the patient complains of only the following symptoms, and all other reviewed systems are negative.:  Fatigue, sleepiness , snoring, fragmented sleep, variable sleep and rise teams.    How likely are you to doze in the following situations: 0 = not likely, 1 = slight chance, 2 = moderate chance, 3 = high chance   Sitting and Reading? Watching Television? Sitting inactive in a public place (theater or meeting)? As a passenger in a car for an hour without a break? Lying down in the afternoon when circumstances permit? Sitting and talking to someone? Sitting quietly after lunch without alcohol? In a car, while stopped  for a few minutes in traffic?   Total = 0/ 24 points   FSS endorsed at NA/   63 points.  GDS- not filled out , either.   Social History   Socioeconomic History  . Marital status: Single    Spouse name: Not on file  . Number of children: Not on file  . Years of education: Not on file  . Highest education level: Not on file  Occupational History  . Not on file  Tobacco Use  . Smoking status: Former Smoker    Quit date: 06/25/1980    Years since quitting: 39.0  . Smokeless tobacco: Never Used  . Tobacco comment: smoked [age] , up to 1/2 ppd  Substance and Sexual Activity  . Alcohol use: Not Currently  . Drug use: No  . Sexual activity: Yes    Birth  control/protection: Post-menopausal  Other Topics Concern  . Not on file  Social History Narrative   No regular exercise   Social Determinants of Health   Financial Resource Strain:   . Difficulty of Paying Living Expenses: Not on file  Food Insecurity:   . Worried About Charity fundraiser in the Last Year: Not on file  . Ran Out of Food in the Last Year: Not on file  Transportation Needs:   . Lack of Transportation (Medical): Not on file  . Lack of Transportation (Non-Medical): Not on file  Physical Activity:   . Days of Exercise per Week: Not on file  . Minutes of Exercise per Session: Not on file  Stress:   . Feeling of Stress : Not on file  Social Connections:   . Frequency of Communication with Friends and Family: Not on file  . Frequency of Social Gatherings with Friends and Family: Not on file  . Attends Religious Services: Not on file  . Active Member of Clubs or Organizations: Not on file  . Attends Archivist Meetings: Not on file  . Marital Status: Not on file    Family History  Problem Relation Age of Onset  . Diabetes Mother   . Stroke Father 61  . Hypertension Father   . Hypertension Sister   . Hypertension Maternal Aunt   . Diabetes Maternal Aunt   . Cancer Neg Hx   . Colon cancer Neg Hx   . Breast cancer Neg Hx     Past Medical History:  Diagnosis Date  . Alopecia   . Herpes zoster 1992   posterior thorax  . Hyperlipidemia 2006    LDL 104, HDL 50  . Hypertension   . OA (osteoarthritis) of knee    Bilateral  . Other abnormal glucose   . Ovarian cyst    Small  . Pre-diabetes   . Uterine fibroid   . Vitamin D deficiency     Past Surgical History:  Procedure Laterality Date  . COLONOSCOPY  2007, 07/25/2016   negative  . CYSTECTOMY     peri rectal  . FOOT SURGERY     bilaterally  . INCONTINENCE SURGERY  2005   Dr Quincy Simmonds  . LIPOMA EXCISION  2011    L chest , Dr Georgette Dover  . SHOULDER ARTHROSCOPY WITH ROTATOR CUFF REPAIR AND  SUBACROMIAL DECOMPRESSION Right 10/18/2017   Procedure: Right shoulder arthroscopy, subacromial decompression, labrial debridement mini open rotator cuff repair;  Surgeon: Susa Day, MD;  Location: [facility];  Service: Orthopedics;  Laterality: Right;  120 mins  . SHOULDER ARTHROSCOPY WITH ROTATOR CUFF REPAIR  AND SUBACROMIAL DECOMPRESSION Left 07/02/2018   Procedure: Left shoulder arthroscopy, subacromial decompression, mini open rotator cuff repair,;  Surgeon: Susa Day, MD;  Location: [facility];  Service: Orthopedics;  Laterality: Left;  90 mins     Current Outpatient Medications on File Prior to Visit  Medication Sig Dispense Refill  . amLODipine (NORVASC) 5 MG tablet Take 1 tablet (5 mg total) by mouth daily. 90 tablet 1  . aspirin 81 MG tablet Take 81 mg by mouth daily.    . Biotin 5000 MCG CAPS Take 1 capsule by mouth daily.     . Cholecalciferol (VITAMIN D-3) 1000 UNITS CAPS Take 1 capsule by mouth daily.    Marland Kitchen loratadine (CLARITIN) 10 MG tablet Take 10 mg by mouth daily.    . metoprolol succinate (TOPROL-XL) 100 MG 24 hr tablet Take 1 tablet (100 mg total) by mouth daily. Take with or immediately following a meal. (Patient not taking: Reported on 06/24/2019) 90 tablet 1  . nitrofurantoin, macrocrystal-monohydrate, (MACROBID) 100 MG capsule Take 1 capsule (100 mg total) by mouth 2 (two) times daily. (Patient not taking: Reported on 06/24/2019) 14 capsule 0   No current facility-administered medications on file prior to visit.    Allergies  Allergen Reactions  . Benazepril Hcl     REACTION: angioedema of lip. She cannot take angiotensin receptor blockers because of this history.    Physical exam:  Today's Vitals   06/24/19 1254  BP: (!) 141/96  Pulse: 75  Temp: 97.6 F (36.4 C)  Weight: 281 lb 8 oz (127.7 kg)  Height: 5\' 5"  (1.651 m)   Body mass index is 46.84 kg/m.   Wt Readings from Last 3 Encounters:  06/24/19 281 lb 8 oz (127.7 kg)  06/10/19 280 lb (127 kg)   06/08/19 280 lb (127 kg)     Ht Readings from Last 3 Encounters:  06/24/19 5\' 5"  (1.651 m)  06/10/19 5\' 4"  (1.626 m)  06/08/19 5\' 4"  (1.626 m)      General: The patient is awake, alert and appears not in acute distress. The patient is well groomed. Head: Normocephalic, atraumatic. Neck is supple. Mallampati 4,  neck circumference:16 inches . Nasal airflow patent.  Retrognathia is  seen.  Dental status:  No dentures Cardiovascular:  Regular rate and cardiac rhythm by pulse,  without distended neck veins. Respiratory: Lungs are clear to auscultation.  Skin:  With evidence of ankle edema. Trunk: The patient's posture is erect.   Neurologic exam : The patient is awake and alert, oriented to place and time.   Memory subjective described as intact.  Attention span & concentration ability appears normal.  Speech is fluent,  without  dysarthria, dysphonia or aphasia.  Mood and affect are appropriate.   Cranial nerves: no loss of smell or taste reported  Pupils are equal and briskly reactive to light. Funduscopic exam ; deferred.  Extraocular movements in vertical and horizontal planes were intact and without nystagmus. No Diplopia. Visual fields by finger perimetry are intact. Hearing was intact to soft voice and finger rubbing.    Facial sensation intact to fine touch. Facial motor strength is symmetric and her tongue and uvula move midline.  Neck ROM : rotation, tilt and flexion extension were normal for age and shoulder shrug was symmetrical.    Motor exam:  Symmetric bulk, tone and ROM.   Normal tone without cog wheeling, symmetric grip strength .   Sensory:  Fine touch, pinprick and vibration were tested  and  normal.  Proprioception tested in the upper extremities was normal.   Coordination: Rapid alternating movements in the fingers/hands were of normal speed.  The Finger-to-nose maneuver was intact without evidence of ataxia, dysmetria or tremor.   Gait and station:  the  patient has left more than right knee pain-  She limps.  Patient could rise unassisted from a seated position, walked without assistive device.  Toe and heel walk were deferred.  Deep tendon reflexes: in the upper and lower extremities are symmetric and intact.  Babinski response was deferred .       After spending a total time of  40  minutes face to face and additional time for physical and neurologic examination, review of laboratory studies,  personal review of imaging studies, reports and results of other testing and review of referral information / records as far as provided in visit, I have established the following assessments:  1) The patient has reached morbid obesity stage 3 in excess of BMI 46- has large neck and narrow upper airway and her HTN has been poorly controlled.   2) She has been observed to have snoring and apnea- she needs screening for OSA.     My Plan is to proceed with:  1) HST or PSG-  For screening test for OSA-  this patient ay have obesity hypoventilation.    I would like to thank Binnie Rail, MD and Binnie Rail, Md [place],  [place] [id number] for allowing me to meet with and to take care of this pleasant patient.  I plan to follow up either personally or through our NP within  2-3 month.   CC: I will share my notes with her PCP. Marland Kitchen  Electronically signed by: Larey Seat, MD 06/24/2019 12:59 PM  [facility] and [facility] Sleep Board certified by The [employer] of Sleep Medicine and Diplomate of the [employer] of Sleep Medicine. Board certified In Neurology through the [place], Fellow of the [employer] of Neurology. Medical Director of [employer].

## 2019-06-24 NOTE — Patient Instructions (Signed)

## 2019-07-08 ENCOUNTER — Ambulatory Visit (INDEPENDENT_AMBULATORY_CARE_PROVIDER_SITE_OTHER): Payer: Federal, State, Local not specified - PPO | Admitting: Neurology

## 2019-07-08 DIAGNOSIS — R0683 Snoring: Secondary | ICD-10-CM

## 2019-07-08 DIAGNOSIS — R7303 Prediabetes: Secondary | ICD-10-CM

## 2019-07-08 DIAGNOSIS — I10 Essential (primary) hypertension: Secondary | ICD-10-CM

## 2019-07-08 DIAGNOSIS — E662 Morbid (severe) obesity with alveolar hypoventilation: Secondary | ICD-10-CM

## 2019-07-08 DIAGNOSIS — Z6841 Body Mass Index (BMI) 40.0 and over, adult: Secondary | ICD-10-CM

## 2019-07-08 DIAGNOSIS — G4734 Idiopathic sleep related nonobstructive alveolar hypoventilation: Secondary | ICD-10-CM

## 2019-07-08 DIAGNOSIS — G4730 Sleep apnea, unspecified: Secondary | ICD-10-CM

## 2019-07-08 DIAGNOSIS — G4733 Obstructive sleep apnea (adult) (pediatric): Secondary | ICD-10-CM | POA: Diagnosis not present

## 2019-07-22 NOTE — Addendum Note (Signed)
Addended by: Larey Seat on: 07/22/2019 05:22 PM   Modules accepted: Orders

## 2019-07-22 NOTE — Procedures (Signed)
Patient Information     First Name: Shari Last Name: [name] ID: [id number]  Birth Date: [birth year] Age: 64 Gender: Female  Referring Provider: Binnie Rail, MD BMI: 47.0 (W=282 lb, H=5' 5'')  Neck Circ.:  16 ''  Sleep Study Information    Study Date: Jul 08, 2019 S/H/A Version: 001.001.001.001 / 4.1.1528 / 48  History:    [name] is an [place] -American female with a medical history of Alopecia, morbid obesity, Hyperlipidemia (2006), poorly controlled Hypertension, OA (osteoarthritis) of knee, Pre-diabetes, Uterine fibroid, and Vitamin D deficiency.  She complained of residual day-night circadian rhythm problems stemming from her night shift work at [employer], she goes to bed at 1 AM and rises at variable times, she snores loudly, and her niece has suspected her to have apnea. Due to increasing BMI she is also at risk for [facility].       Summary & Diagnosis:      The patient tested positive for severe sleep apnea with an AHI of 56.5/h, partially accentuated in REM sleep to AHI 75/h. especially REM sleep in supine was associated with frequent oxygen desaturations, ODI was 41.5/h, which is severe.  Bradycardia was noted, but no tachycardia recorded.  Recommendations:      This patient will need to be treated with positive airway pressure therapy ,either by autotitration CPAP device (setting a pressure window for 6-18 cm water, 2 cm EPR, heated humidity and mask of choice) or return for an attended sleep study PAP titration in order to also have the opportunity to titrate oxygen.   Interpreting Physician: Larey Seat, MD             Sleep Summary  Oxygen Saturation Statistics   Start Study Time: End Study Time: Total Recording Time:          11:21:55 PM 8:31:28 AM 9 h, 9 min  Total Sleep Time % REM of Sleep Time:  8 h, 19 min  15.4    Mean: 94 Minimum: 76 Maximum: 99  Mean of Desaturations (%):   91  Oxygen Desaturation. %:   4-9 10-20 >20 Total  Events Number Total    322  10 96.1 3.0  3 0.9  335 100.0  Oxygen Saturation: <90 <=88 <85 <80 <70  Duration (minutes): Sleep % 7.0 1.4  4.8 0.4  1.0 0.1 0.0 0.0 0.0 0.0     Respiratory Indices      Total Events REM NREM All Night  pRDI:  456  pAHI:  456 ODI:  335  pAHIc:  84  %  CSR: 0.0 75.4 75.4 66.3 6.6 53.2 53.2 37.2 12.4 56.5 56.5 41.5 11.4       Pulse Rate Statistics during Sleep (BPM)      Mean: 60 Minimum: 37 Maximum: 85    Indices are calculated using technically valid sleep time of 8 h, 4 min. Central-Indices are calculated using technically valid sleep time of 7  h, 20 min. pRDI/pAHI are calculated using o02 desaturations ? 3% Sit N/A Body Position Statistics  Position Supine Prone Right Left Non-Supine  Sleep (min) 170.5 214.0 110.5 4.0 328.5  Sleep % 34.2 42.9 22.1 0.8 65.8  pRDI 66.9 39.6 72.6 N/A 51.3  pAHI 66.9 39.6 72.6 N/A 51.3  ODI 54.4 23.5 57.3 N/A 35.1     Snoring Statistics Snoring Level (dB) >40 >50 >60 >70 >80 >Threshold (45)  Sleep (min) 274.8 38.4 5.1 0.2 0.0 76.7  Sleep % 55.1 7.7 1.0 0.0  0.0 15.4    Mean: 43 dB

## 2019-07-22 NOTE — Progress Notes (Signed)
The patient tested positive for severe sleep apnea with an AHI of  56.5/h, partially accentuated in REM sleep to AHI 75/h.  especially REM sleep in supine was associated with frequent  oxygen desaturations, ODI was 41.5/h, which is severe.   Bradycardia was noted, but no tachycardia recorded.  Recommendations:     This patient will need to be treated with positive airway  pressure therapy ,either by autotitration CPAP device (setting a  pressure window for 6-18 cm water, 2 cm EPR, heated humidity and  mask of choice) or return for an attended sleep study PAP  titration in order to also have the opportunity to titrate  oxygen.

## 2019-07-23 ENCOUNTER — Telehealth: Payer: Self-pay | Admitting: Neurology

## 2019-07-23 ENCOUNTER — Encounter: Payer: Self-pay | Admitting: Neurology

## 2019-07-23 NOTE — Telephone Encounter (Signed)
-----   Message from Larey Seat, MD sent at 07/22/2019  5:22 PM EST ----- The patient tested positive for severe sleep apnea with an AHI of  56.5/h, partially accentuated in REM sleep to AHI 75/h.  especially REM sleep in supine was associated with frequent  oxygen desaturations, ODI was 41.5/h, which is severe.   Bradycardia was noted, but no tachycardia recorded.  Recommendations:     This patient will need to be treated with positive airway  pressure therapy ,either by autotitration CPAP device (setting a  pressure window for 6-18 cm water, 2 cm EPR, heated humidity and  mask of choice) or return for an attended sleep study PAP  titration in order to also have the opportunity to titrate  oxygen.

## 2019-07-23 NOTE — Telephone Encounter (Signed)
Called patient to discuss sleep study results. No answer at this time. LVM for the patient to call back.  Will also send a mychart message.  

## 2019-07-30 NOTE — Telephone Encounter (Signed)
Called the patient, this is 3rd attempt to reach patient to review her sleep results. LVM for pt to return call.

## 2019-08-03 NOTE — Telephone Encounter (Signed)
I called pt. I advised pt that Dr. Brett Fairy reviewed their sleep study results and found that pt has severe sleep apnea. Dr. Brett Fairy recommends that pt starts auto CPAP. I reviewed PAP compliance expectations with the pt. Pt is agreeable to starting a CPAP. I advised pt that an order will be sent to a DME, [facility], and [facility] will call the pt within about one week after they file with the pt's insurance. [facility] will show the pt how to use the machine, fit for masks, and troubleshoot the CPAP if needed. A follow up appt was made for insurance purposes with Ward Givens, NP on April 12,2021 at 1:30 pm. Pt verbalized understanding to arrive 15 minutes early and bring their CPAP. A letter with all of this information in it will be mailed to the pt as a reminder. I verified with the pt that the address we have on file is correct. Pt verbalized understanding of results. Pt had no questions at this time but was encouraged to call back if questions arise. I have sent the order to [facility] and have received confirmation that they have received the order.

## 2019-08-17 DIAGNOSIS — G4733 Obstructive sleep apnea (adult) (pediatric): Secondary | ICD-10-CM | POA: Diagnosis not present

## 2019-08-23 ENCOUNTER — Ambulatory Visit: Payer: Federal, State, Local not specified - PPO | Attending: Internal Medicine

## 2019-08-23 ENCOUNTER — Other Ambulatory Visit: Payer: Self-pay

## 2019-08-23 DIAGNOSIS — Z23 Encounter for immunization: Secondary | ICD-10-CM | POA: Insufficient documentation

## 2019-08-23 NOTE — Progress Notes (Signed)
   Covid-19 Vaccination Clinic  Name:  [name]    MRN: [id number] DOB: [birth year]  08/23/2019  [name] was observed post Covid-19 immunization for 15 minutes without incidence. She was provided with Vaccine Information Sheet and instruction to access the V-Safe system.   [name] was instructed to call 911 with any severe reactions post vaccine: Marland Kitchen Difficulty breathing  . Swelling of your face and throat  . A fast heartbeat  . A bad rash all over your body  . Dizziness and weakness    Immunizations Administered    Name Date Dose VIS Date Route   Pfizer COVID-19 Vaccine 08/23/2019  8:45 AM 0.3 mL 06/05/2019 Intramuscular   Manufacturer: [place]   Lot: [id number]   [place]: [id number]

## 2019-09-01 ENCOUNTER — Encounter: Payer: Self-pay | Admitting: Adult Health

## 2019-09-16 ENCOUNTER — Ambulatory Visit: Payer: Federal, State, Local not specified - PPO | Attending: Internal Medicine

## 2019-09-16 DIAGNOSIS — Z23 Encounter for immunization: Secondary | ICD-10-CM

## 2019-09-16 NOTE — Progress Notes (Signed)
   Covid-19 Vaccination Clinic  Name:  [name]    MRN: [id number] DOB: 12/05/1955  09/16/2019  [name] was observed post Covid-19 immunization for 15 minutes without incident. She was provided with Vaccine Information Sheet and instruction to access the V-Safe system.   [name] was instructed to call 911 with any severe reactions post vaccine: Marland Kitchen Difficulty breathing  . Swelling of face and throat  . A fast heartbeat  . A bad rash all over body  . Dizziness and weakness   Immunizations Administered    Name Date Dose VIS Date Route   Pfizer COVID-19 Vaccine 09/16/2019  9:20 AM 0.3 mL 06/05/2019 Intramuscular   Manufacturer: [place]   Lot: [id number]   [place]: [id number]

## 2019-09-17 ENCOUNTER — Ambulatory Visit: Payer: Federal, State, Local not specified - PPO

## 2019-09-21 DIAGNOSIS — Z6841 Body Mass Index (BMI) 40.0 and over, adult: Secondary | ICD-10-CM | POA: Diagnosis not present

## 2019-09-21 DIAGNOSIS — Z01419 Encounter for gynecological examination (general) (routine) without abnormal findings: Secondary | ICD-10-CM | POA: Diagnosis not present

## 2019-09-21 DIAGNOSIS — Z124 Encounter for screening for malignant neoplasm of cervix: Secondary | ICD-10-CM | POA: Diagnosis not present

## 2019-10-05 ENCOUNTER — Ambulatory Visit: Payer: Federal, State, Local not specified - PPO | Admitting: Adult Health

## 2019-10-05 ENCOUNTER — Other Ambulatory Visit: Payer: Self-pay

## 2019-10-05 VITALS — BP 144/88 | HR 78 | Temp 98.0°F | Ht 64.0 in | Wt 284.0 lb

## 2019-10-05 DIAGNOSIS — G4733 Obstructive sleep apnea (adult) (pediatric): Secondary | ICD-10-CM

## 2019-10-05 DIAGNOSIS — Z9989 Dependence on other enabling machines and devices: Secondary | ICD-10-CM

## 2019-10-05 NOTE — Progress Notes (Signed)
PATIENT: [name] DOB: Oct 26, 1955  REASON FOR VISIT: follow up HISTORY FROM: patient  HISTORY OF PRESENT ILLNESS: Today 10/05/19  [name] is a 64 year old female with a history of obstructive sleep apnea on CPAP.  Her download indicates that she use her machine nightly for compliance of 100%.  Use her machine greater than 4 hours 28 days for compliance of 93%.  On average use her machine 6 hours and 48 minutes.  Residual AHI is 1.9 on 6 to 18 cm of water with EPR of 2.  Leak in the 95th percentile is 20.9 L/min.  She reports that she cannot really tell a difference in how she feels since she started using the machine.  Although she does report that she did not have any symptoms prior to other than her family reporting that she stopped breathing.  HISTORY (Copied from Dr.Dohmeier's note) [name] a 64  year old  African American female patient andseenon 06/24/2019 upon referral by Dr Ruthe Mannan a sleep medicine consultation.  The patient has high blood pressure and gained weight during the pandemic.  Chiefconcernaccording to patient :  "My family witnessed my snoring, and reported apnea".  I have the pleasure of seeing [name] today,a left-handed African American female with a possible sleep disorder.She  has a past medical history of Alopecia, Herpes zoster (1992), Hyperlipidemia (2006), Hypertension, OA (osteoarthritis) of knee, Other abnormal glucose, Ovarian cyst, Pre-diabetes, Uterine fibroid, and Vitamin D deficiency.  Severe/ morbid Obesity at BMI of 46.8 kg/m2.     Sleeprelevant medical history: Nocturia : none , no Tonsillectomy, no cervical spine trauma, or surgery, no ENT repair.Familymedical /sleep history:no other family member on CPAP with OSA. Sister was tested and was negative, per her report.   Social history:Patient is retired from [employer] and lives in a household with 1 person- a nice lives with her.   Family status is single .  The  patient used to work in shifts( Presenter, broadcasting,) Pets are not present. Tobacco use : quit 1982, she stopped after only 4 years of smoking. ETOH use : none , Caffeine intake in form of Coffee( 1 cup in AM ) Soda( 1 can a day or less) Tea ( none ) and no energy drinks. No regular exercise  .   Hobbies : retail therapy    Sleep habits are as follows: The patient's dinner time is between 6- 9 PM. The patient goes to bed at 1 AM and goes to sleep immediately- she usually takes a hot shower before, the  bedroom is cool , quiet and dark- continues to sleep for 7-8 hours, wakes for one bathroom breaks,.The preferred sleep position is laterally, with the support of 2 pillows.  Dreams are reportedly rare.  9 AM is the usual rise time. The patient wakes up spontaneously at 9 AM.  She reports not feeling refreshed or restored in AM, with symptoms such as dry mouth,  and residual fatigue. Naps are taken rarely.  REVIEW OF SYSTEMS: Out of a complete 14 system review of symptoms, the patient complains only of the following symptoms, and all other reviewed systems are negative.  FSS 0 ESS 3  ALLERGIES: Allergies  Allergen Reactions  . Benazepril Hcl     REACTION: angioedema of lip. She cannot take angiotensin receptor blockers because of this history.    HOME MEDICATIONS: Outpatient Medications Prior to Visit  Medication Sig Dispense Refill  . amLODipine (NORVASC) 5 MG tablet Take 1 tablet (5 mg total)  by mouth daily. 90 tablet 1  . aspirin 81 MG tablet Take 81 mg by mouth daily.    . Biotin 5000 MCG CAPS Take 1 capsule by mouth daily.     . Cholecalciferol (VITAMIN D-3) 1000 UNITS CAPS Take 1 capsule by mouth daily.    Marland Kitchen loratadine (CLARITIN) 10 MG tablet Take 10 mg by mouth daily.    . metoprolol succinate (TOPROL-XL) 100 MG 24 hr tablet Take 1 tablet (100 mg total) by mouth daily. Take with or immediately following a meal. 90 tablet 1  . nitrofurantoin, macrocrystal-monohydrate, (MACROBID)  100 MG capsule Take 1 capsule (100 mg total) by mouth 2 (two) times daily. (Patient not taking: Reported on 06/24/2019) 14 capsule 0   No facility-administered medications prior to visit.    PAST MEDICAL HISTORY: Past Medical History:  Diagnosis Date  . Alopecia   . Herpes zoster 1992   posterior thorax  . Hyperlipidemia 2006    LDL 104, HDL 50  . Hypertension   . OA (osteoarthritis) of knee    Bilateral  . Other abnormal glucose   . Ovarian cyst    Small  . Pre-diabetes   . Uterine fibroid   . Vitamin D deficiency     PAST SURGICAL HISTORY: Past Surgical History:  Procedure Laterality Date  . COLONOSCOPY  2007, 07/25/2016   negative  . CYSTECTOMY     peri rectal  . FOOT SURGERY     bilaterally  . INCONTINENCE SURGERY  2005   Dr Quincy Simmonds  . LIPOMA EXCISION  2011    L chest , Dr Georgette Dover  . SHOULDER ARTHROSCOPY WITH ROTATOR CUFF REPAIR AND SUBACROMIAL DECOMPRESSION Right 10/18/2017   Procedure: Right shoulder arthroscopy, subacromial decompression, labrial debridement mini open rotator cuff repair;  Surgeon: Susa Day, MD;  Location: [facility];  Service: Orthopedics;  Laterality: Right;  120 mins  . SHOULDER ARTHROSCOPY WITH ROTATOR CUFF REPAIR AND SUBACROMIAL DECOMPRESSION Left 07/02/2018   Procedure: Left shoulder arthroscopy, subacromial decompression, mini open rotator cuff repair,;  Surgeon: Susa Day, MD;  Location: [facility];  Service: Orthopedics;  Laterality: Left;  90 mins    FAMILY HISTORY: Family History  Problem Relation Age of Onset  . Diabetes Mother   . Stroke Father 25  . Hypertension Father   . Hypertension Sister   . Hypertension Maternal Aunt   . Diabetes Maternal Aunt   . Cancer Neg Hx   . Colon cancer Neg Hx   . Breast cancer Neg Hx     SOCIAL HISTORY: Social History   Socioeconomic History  . Marital status: Single    Spouse name: Not on file  . Number of children: Not on file  . Years of education: Not on file  . Highest education  level: Not on file  Occupational History  . Not on file  Tobacco Use  . Smoking status: Former Smoker    Quit date: 06/25/1980    Years since quitting: 39.3  . Smokeless tobacco: Never Used  . Tobacco comment: smoked [age] , up to 1/2 ppd  Substance and Sexual Activity  . Alcohol use: Not Currently  . Drug use: No  . Sexual activity: Yes    Birth control/protection: Post-menopausal  Other Topics Concern  . Not on file  Social History Narrative   No regular exercise   Social Determinants of Health   Financial Resource Strain:   . Difficulty of Paying Living Expenses:   Food Insecurity:   . Worried About  Running Out of Food in the Last Year:   . [place] in the Last Year:   Transportation Needs:   . Lack of Transportation (Medical):   Marland Kitchen Lack of Transportation (Non-Medical):   Physical Activity:   . Days of Exercise per Week:   . Minutes of Exercise per Session:   Stress:   . Feeling of Stress :   Social Connections:   . Frequency of Communication with Friends and Family:   . Frequency of Social Gatherings with Friends and Family:   . Attends Religious Services:   . Active Member of Clubs or Organizations:   . Attends Archivist Meetings:   Marland Kitchen Marital Status:   Intimate Partner Violence:   . Fear of Current or Ex-Partner:   . Emotionally Abused:   Marland Kitchen Physically Abused:   . Sexually Abused:       PHYSICAL EXAM  Vitals:   10/05/19 1316  BP: (!) 144/88  Pulse: 78  Temp: 98 F (36.7 C)  Weight: 284 lb (128.8 kg)  Height: 5\' 4"  (1.626 m)   Body mass index is 48.75 kg/m.  Generalized: Well developed, in no acute distress  Chest: Lungs clear to auscultation bilaterally  Neurological examination  Mentation: Alert oriented to time, place, history taking. Follows all commands speech and language fluent Cranial nerve II-XII: Extraocular movements were full, visual field were full on confrontational test Head turning and shoulder shrug  were normal  and symmetric. Motor: The motor testing reveals 5 over 5 strength of all 4 extremities. Good symmetric motor tone is noted throughout.  Sensory: Sensory testing is intact to soft touch on all 4 extremities. No evidence of extinction is noted.  Gait and station: Gait is normal.    DIAGNOSTIC DATA (LABS, IMAGING, TESTING) - I reviewed patient records, labs, notes, testing and imaging myself where available.  Lab Results  Component Value Date   WBC 8.9 12/16/2018   HGB 13.7 12/16/2018   HCT 42.6 12/16/2018   MCV 88.9 12/16/2018   PLT 271.0 12/16/2018      Component Value Date/Time   NA 140 06/08/2019 1434   K 4.0 06/08/2019 1434   CL 108 06/08/2019 1434   CO2 26 06/08/2019 1434   GLUCOSE 81 06/08/2019 1434   BUN 19 06/08/2019 1434   CREATININE 0.89 06/08/2019 1434   CALCIUM 9.5 06/08/2019 1434   PROT 7.3 06/08/2019 1434   ALBUMIN 4.0 06/08/2019 1434   AST 15 06/08/2019 1434   ALT 12 06/08/2019 1434   ALKPHOS 91 06/08/2019 1434   BILITOT 0.3 06/08/2019 1434   GFRNONAA 60 (L) 06/23/2018 1531   GFRAA >60 06/23/2018 1531   Lab Results  Component Value Date   CHOL 176 12/16/2018   HDL 48.20 12/16/2018   LDLCALC 113 (H) 12/16/2018   TRIG 75.0 12/16/2018   CHOLHDL 4 12/16/2018   Lab Results  Component Value Date   HGBA1C 6.2 06/08/2019   No results found for: VITAMINB12 Lab Results  Component Value Date   TSH 2.49 12/16/2018      ASSESSMENT AND PLAN 64 y.o. year old female  has a past medical history of Alopecia, Herpes zoster (1992), Hyperlipidemia (2006), Hypertension, OA (osteoarthritis) of knee, Other abnormal glucose, Ovarian cyst, Pre-diabetes, Uterine fibroid, and Vitamin D deficiency. here with:  1. OSA on CPAP  - CPAP compliance excellent - Good treatment of AHI  - Encourage patient to use CPAP nightly and > 4 hours each night - F/U in  6 months or sooner if needed   I spent 20 minutes of face-to-face and non-face-to-face time with patient.  This  included previsit chart review, lab review, study review, order entry, electronic health record documentation, patient education.  Ward Givens, MSN, NP-C 10/05/2019, 2:01 PM [facility] [address], [place], [place] [address] [phone]

## 2019-10-05 NOTE — Patient Instructions (Signed)
Continue using CPAP nightly and greater than 4 hours each night °If your symptoms worsen or you develop new symptoms please let us know.  ° °

## 2019-10-08 ENCOUNTER — Encounter: Payer: Self-pay | Admitting: Critical Care Medicine

## 2019-10-08 ENCOUNTER — Other Ambulatory Visit: Payer: Self-pay

## 2019-10-08 ENCOUNTER — Ambulatory Visit: Payer: Federal, State, Local not specified - PPO | Admitting: Critical Care Medicine

## 2019-10-08 VITALS — BP 144/78 | HR 62 | Temp 97.5°F | Ht 64.5 in | Wt 284.6 lb

## 2019-10-08 DIAGNOSIS — R053 Chronic cough: Secondary | ICD-10-CM

## 2019-10-08 DIAGNOSIS — J309 Allergic rhinitis, unspecified: Secondary | ICD-10-CM | POA: Diagnosis not present

## 2019-10-08 DIAGNOSIS — R05 Cough: Secondary | ICD-10-CM

## 2019-10-08 MED ORDER — FLUTICASONE PROPIONATE 50 MCG/ACT NA SUSP: 2.0000 | Freq: Every day | NASAL | 11 refills | Status: DC

## 2019-10-08 NOTE — Patient Instructions (Addendum)
Thank you for visiting Dr. Carlis Abbott at [facility]. We recommend the following: Orders Placed This Encounter  Procedures  . Pulmonary function test   Orders Placed This Encounter  Procedures  . Pulmonary function test    Standing Status:   Future    Standing Expiration Date:   10/07/2020    Order Specific Question:   Where should this test be performed?    Answer:   [facility]    Order Specific Question:   Full PFT: includes the following: basic spirometry, spirometry pre & post bronchodilator, diffusion capacity (DLCO), lung volumes    Answer:   Full PFT   Keep taking your loratidine (Claritin) daily.   Meds ordered this encounter  Medications  . fluticasone (FLONASE) 50 MCG/ACT nasal spray    Sig: Place 2 sprays into both nostrils daily.    Dispense:  16 g    Refill:  11    Return in about 4 weeks (around 11/05/2019).    Please do your part to reduce the spread of COVID-19.

## 2019-10-08 NOTE — Progress Notes (Signed)
Synopsis: Referred in April 2021 for cough by Binnie Rail, MD  Subjective:   PATIENT ID: [name] GENDER: female DOB: 05-09-1956, MRN: [id number]  Chief Complaint  Patient presents with  . Consult    Patient has dry cough that she has had for awhile and when she gets hot it makes it worse.     [name] is a 64 year old woman who presents for evaluation of chronic cough that has been ongoing for the past 6 to 12 months.  This started after a cold and has lingered.  She has had other similar episodes when she has had a cold in the past.  She has tried cough medicine that helped somewhat, but her cough returned.  An albuterol inhaler has helped her symptoms recently.  She denies fever, chills, sweats, sore throat, rhinorrhea, postnasal drip, heartburn, hoarseness.  No shortness of breath, wheezing.  She has sputum that is clear only in the morning.  Her symptoms are worse when she is hot.  She has seasonal allergies and takes Claritin every day, but has not tried nasal sprays.  She has no previous history of asthma; no family history of lung disease.  She is not on an ACE inhibitor.  She recently started on CPAP for OSA but has not noticed a change in her cough.  She has no pets.  No mold exposures.  She smoked for about 6 years prior to quitting in her 69s.  She is unsure how many packs per day she smoked at that time.    Past Medical History:  Diagnosis Date  . Alopecia   . Herpes zoster 1992   posterior thorax  . Hyperlipidemia 2006    LDL 104, HDL 50  . Hypertension   . OA (osteoarthritis) of knee    Bilateral  . Other abnormal glucose   . Ovarian cyst    Small  . Pre-diabetes   . Uterine fibroid   . Vitamin D deficiency      Family History  Problem Relation Age of Onset  . Diabetes Mother   . Stroke Father 66  . Hypertension Father   . Hypertension Sister   . Hypertension Maternal Aunt   . Diabetes Maternal Aunt   . Cancer Neg Hx   . Colon cancer Neg Hx   .  Breast cancer Neg Hx      Past Surgical History:  Procedure Laterality Date  . COLONOSCOPY  2007, 07/25/2016   negative  . CYSTECTOMY     peri rectal  . FOOT SURGERY     bilaterally  . INCONTINENCE SURGERY  2005   Dr Quincy Simmonds  . LIPOMA EXCISION  2011    L chest , Dr Georgette Dover  . SHOULDER ARTHROSCOPY WITH ROTATOR CUFF REPAIR AND SUBACROMIAL DECOMPRESSION Right 10/18/2017   Procedure: Right shoulder arthroscopy, subacromial decompression, labrial debridement mini open rotator cuff repair;  Surgeon: Susa Day, MD;  Location: [facility];  Service: Orthopedics;  Laterality: Right;  120 mins  . SHOULDER ARTHROSCOPY WITH ROTATOR CUFF REPAIR AND SUBACROMIAL DECOMPRESSION Left 07/02/2018   Procedure: Left shoulder arthroscopy, subacromial decompression, mini open rotator cuff repair,;  Surgeon: Susa Day, MD;  Location: [facility];  Service: Orthopedics;  Laterality: Left;  90 mins    Social History   Socioeconomic History  . Marital status: Single    Spouse name: Not on file  . Number of children: Not on file  . Years of education: Not on file  . Highest education level:  Not on file  Occupational History  . Not on file  Tobacco Use  . Smoking status: Former Smoker    Years: 6.00    Types: Cigarettes    Quit date: 06/25/1980    Years since quitting: 39.3  . Smokeless tobacco: Never Used  . Tobacco comment: smoked [age] , up to 1/2 ppd  Substance and Sexual Activity  . Alcohol use: Not Currently  . Drug use: No  . Sexual activity: Yes    Birth control/protection: Post-menopausal  Other Topics Concern  . Not on file  Social History Narrative   No regular exercise   Social Determinants of Health   Financial Resource Strain:   . Difficulty of Paying Living Expenses:   Food Insecurity:   . Worried About Charity fundraiser in the Last Year:   . Arboriculturist in the Last Year:   Transportation Needs:   . Film/video editor (Medical):   Marland Kitchen Lack of Transportation (Non-Medical):     Physical Activity:   . Days of Exercise per Week:   . Minutes of Exercise per Session:   Stress:   . Feeling of Stress :   Social Connections:   . Frequency of Communication with Friends and Family:   . Frequency of Social Gatherings with Friends and Family:   . Attends Religious Services:   . Active Member of Clubs or Organizations:   . Attends Archivist Meetings:   Marland Kitchen Marital Status:   Intimate Partner Violence:   . Fear of Current or Ex-Partner:   . Emotionally Abused:   Marland Kitchen Physically Abused:   . Sexually Abused:      Allergies  Allergen Reactions  . Benazepril Hcl     REACTION: angioedema of lip. She cannot take angiotensin receptor blockers because of this history.     Immunization History  Administered Date(s) Administered  . Influenza Whole 03/24/2009  . Influenza,inj,Quad PF,6+ Mos 06/08/2019  . PFIZER SARS-COV-2 Vaccination 08/23/2019, 09/16/2019  . Td 02/26/2008  . Tdap 12/16/2018    Outpatient Medications Prior to Visit  Medication Sig Dispense Refill  . amLODipine (NORVASC) 5 MG tablet Take 1 tablet (5 mg total) by mouth daily. 90 tablet 1  . aspirin 81 MG tablet Take 81 mg by mouth daily.    . Biotin 5000 MCG CAPS Take 1 capsule by mouth daily.     . Cholecalciferol (VITAMIN D-3) 1000 UNITS CAPS Take 1 capsule by mouth daily.    Marland Kitchen loratadine (CLARITIN) 10 MG tablet Take 10 mg by mouth daily.    . metoprolol succinate (TOPROL-XL) 100 MG 24 hr tablet Take 1 tablet (100 mg total) by mouth daily. Take with or immediately following a meal. 90 tablet 1   No facility-administered medications prior to visit.    ROS   Objective:   Vitals:   10/08/19 1554  BP: (!) 144/78  Pulse: 62  Temp: (!) 97.5 F (36.4 C)  TempSrc: Temporal  SpO2: 98%  Weight: 284 lb 9.6 oz (129.1 kg)  Height: 5' 4.5" (1.638 m)   98% on   RA BMI Readings from Last 3 Encounters:  10/08/19 48.10 kg/m  10/05/19 48.75 kg/m  06/24/19 46.84 kg/m   Wt Readings from  Last 3 Encounters:  10/08/19 284 lb 9.6 oz (129.1 kg)  10/05/19 284 lb (128.8 kg)  06/24/19 281 lb 8 oz (127.7 kg)    Physical Exam Vitals reviewed.  Constitutional:      Appearance: She is  obese. She is not ill-appearing.  HENT:     Head: Normocephalic and atraumatic.     Nose:     Comments: Erythematous and edematous turbinates    Mouth/Throat:     Comments: Mallampati 4 Eyes:     General: No scleral icterus. Cardiovascular:     Rate and Rhythm: Normal rate and regular rhythm.     Heart sounds: No murmur.  Pulmonary:     Comments: Breathing comfortably on room air, no conversational dyspnea.  Occasional throat clearing.  Clear to auscultation bilaterally. Abdominal:     General: There is no distension.     Palpations: Abdomen is soft.     Tenderness: There is no abdominal tenderness.  Musculoskeletal:        General: No swelling or deformity.     Cervical back: Neck supple.  Lymphadenopathy:     Cervical: No cervical adenopathy.  Skin:    General: Skin is warm and dry.     Findings: No rash.  Neurological:     General: No focal deficit present.     Mental Status: She is alert.     Coordination: Coordination normal.  Psychiatric:        Mood and Affect: Mood normal.        Behavior: Behavior normal.      CBC    Component Value Date/Time   WBC 8.9 12/16/2018 1404   RBC 4.79 12/16/2018 1404   HGB 13.7 12/16/2018 1404   HCT 42.6 12/16/2018 1404   PLT 271.0 12/16/2018 1404   MCV 88.9 12/16/2018 1404   MCH 28.7 06/23/2018 1531   MCHC 32.1 12/16/2018 1404   RDW 14.7 12/16/2018 1404   LYMPHSABS 2.6 12/16/2018 1404   MONOABS 0.8 12/16/2018 1404   EOSABS 0.3 12/16/2018 1404   BASOSABS 0.0 12/16/2018 1404    CHEMISTRY No results for input(s): NA, K, CL, CO2, GLUCOSE, BUN, CREATININE, CALCIUM, MG, PHOS in the last 168 hours. CrCl cannot be calculated (Patient's most recent lab result is older than the maximum 21 days allowed.).   Chest Imaging- films  reviewed: None available  Pulmonary Functions Testing Results: No flowsheet data found.      Assessment & Plan:     ICD-10-CM   1. Chronic cough  R05 Pulmonary function test  2. Allergic rhinitis, unspecified seasonality, unspecified trigger  J30.9     Chronic cough- suspect due to upper airway cough syndrome from postnasal drip versus cough variant asthma -Continue loratadine daily -Add Flonase 2 sprays bilaterally once daily -PFTs -Continue albuterol as needed  Allergic rhinosinusitis -Continue Claritin -Start Flonase 2 sprays bilaterally once daily  RTC in 2 to 4 weeks after PFTs.    Current Outpatient Medications:  .  amLODipine (NORVASC) 5 MG tablet, Take 1 tablet (5 mg total) by mouth daily., Disp: 90 tablet, Rfl: 1 .  aspirin 81 MG tablet, Take 81 mg by mouth daily., Disp: , Rfl:  .  Biotin 5000 MCG CAPS, Take 1 capsule by mouth daily. , Disp: , Rfl:  .  Cholecalciferol (VITAMIN D-3) 1000 UNITS CAPS, Take 1 capsule by mouth daily., Disp: , Rfl:  .  loratadine (CLARITIN) 10 MG tablet, Take 10 mg by mouth daily., Disp: , Rfl:  .  metoprolol succinate (TOPROL-XL) 100 MG 24 hr tablet, Take 1 tablet (100 mg total) by mouth daily. Take with or immediately following a meal., Disp: 90 tablet, Rfl: 1 .  fluticasone (FLONASE) 50 MCG/ACT nasal spray, Place 2 sprays into both  nostrils daily., Disp: 16 g, Rfl: [place], DO [facility] 10/08/2019 4:14 PM

## 2019-10-15 DIAGNOSIS — G4733 Obstructive sleep apnea (adult) (pediatric): Secondary | ICD-10-CM | POA: Diagnosis not present

## 2019-10-31 ENCOUNTER — Other Ambulatory Visit (HOSPITAL_COMMUNITY)
Admission: RE | Admit: 2019-10-31 | Discharge: 2019-10-31 | Disposition: A | Discharge status: Home / Self Care | Payer: Federal, State, Local not specified - PPO | Source: Ambulatory Visit | Attending: Critical Care Medicine | Admitting: Critical Care Medicine

## 2019-10-31 DIAGNOSIS — Z20822 Contact with and (suspected) exposure to covid-19: Secondary | ICD-10-CM | POA: Insufficient documentation

## 2019-10-31 DIAGNOSIS — Z01812 Encounter for preprocedural laboratory examination: Secondary | ICD-10-CM | POA: Diagnosis not present

## 2019-10-31 LAB — SARS CORONAVIRUS 2 (TAT 6-24 HRS): SARS Coronavirus 2: NEGATIVE

## 2019-11-02 ENCOUNTER — Telehealth: Payer: Self-pay | Admitting: Critical Care Medicine

## 2019-11-02 NOTE — Telephone Encounter (Signed)
Called spoke with patient.  There was no labs from Dr. Carlis Abbott ordered since last visit besides the covid test on 10/31/19 which patient got.  I told her she can discuss with PC tomorrow at her visit.  Patient voiced understanding Nothing further needed at this time.

## 2019-11-03 ENCOUNTER — Ambulatory Visit: Payer: Federal, State, Local not specified - PPO | Admitting: Critical Care Medicine

## 2019-11-03 ENCOUNTER — Encounter: Payer: Self-pay | Admitting: Critical Care Medicine

## 2019-11-03 ENCOUNTER — Ambulatory Visit (INDEPENDENT_AMBULATORY_CARE_PROVIDER_SITE_OTHER): Payer: Federal, State, Local not specified - PPO | Admitting: Critical Care Medicine

## 2019-11-03 ENCOUNTER — Other Ambulatory Visit: Payer: Self-pay

## 2019-11-03 VITALS — BP 152/78 | HR 65 | Temp 97.6°F | Ht 65.0 in | Wt 285.2 lb

## 2019-11-03 DIAGNOSIS — R053 Chronic cough: Secondary | ICD-10-CM

## 2019-11-03 DIAGNOSIS — J309 Allergic rhinitis, unspecified: Secondary | ICD-10-CM

## 2019-11-03 DIAGNOSIS — R05 Cough: Secondary | ICD-10-CM

## 2019-11-03 LAB — PULMONARY FUNCTION TEST
DL/VA % pred: 152 %
DL/VA: 6.35 ml/min/mmHg/L
DLCO cor % pred: 113 %
DLCO cor: 23.56 ml/min/mmHg
DLCO unc % pred: 113 %
DLCO unc: 23.56 ml/min/mmHg
FEF 25-75 Post: 3.28 L/sec
FEF 25-75 Pre: 2.98 L/sec
FEF2575-%Change-Post: 10 %
FEF2575-%Pred-Post: 161 %
FEF2575-%Pred-Pre: 146 %
FEV1-%Change-Post: 2 %
FEV1-%Pred-Post: 98 %
FEV1-%Pred-Pre: 95 %
FEV1-Post: 2.07 L
FEV1-Pre: 2.02 L
FEV1FVC-%Change-Post: 0 %
FEV1FVC-%Pred-Pre: 109 %
FEV6-%Change-Post: 2 %
FEV6-%Pred-Post: 91 %
FEV6-%Pred-Pre: 89 %
FEV6-Post: 2.39 L
FEV6-Pre: 2.33 L
FEV6FVC-%Change-Post: 0 %
FEV6FVC-%Pred-Post: 103 %
FEV6FVC-%Pred-Pre: 103 %
FVC-%Change-Post: 2 %
FVC-%Pred-Post: 88 %
FVC-%Pred-Pre: 86 %
FVC-Post: 2.39 L
FVC-Pre: 2.33 L
Post FEV1/FVC ratio: 87 %
Post FEV6/FVC ratio: 100 %
Pre FEV1/FVC ratio: 87 %
Pre FEV6/FVC Ratio: 100 %
RV % pred: 78 %
RV: 1.65 L
TLC % pred: 80 %
TLC: 4.17 L

## 2019-11-03 NOTE — Progress Notes (Signed)
PFT done today. 

## 2019-11-03 NOTE — Patient Instructions (Addendum)
Thank you for visiting Dr. Carlis Abbott at [facility]. We recommend the following:  Keep taking flonase daily and claritin daily.  Return if symptoms worsen or fail to improve.    Please do your part to reduce the spread of COVID-19.

## 2019-11-03 NOTE — Progress Notes (Signed)
Synopsis: Referred in April 2021 for cough by Binnie Rail, MD  Subjective:   PATIENT ID: [name] GENDER: female DOB: 07-02-1955, MRN: [id number]  Chief Complaint  Patient presents with  . Follow-up    PFT follow up     [name] is a 64 y/o woman with a history of chronic cough who presents for follow-up after PFTs.  Her cough is improved since starting Flonase nasal spray.  She continues on Claritin.  She has had sobbing occasional coughing, which is less frequent.  Her cough is always worse when she gets bronchitis, and persist for several months afterwards.  She has never tried inhalers in the past.  She felt no different after using her inhaler during PFTs.     OV 10/08/19: [name] is a 64 year old woman who presents for evaluation of chronic cough that has been ongoing for the past 6 to 12 months.  This started after a cold and has lingered.  She has had other similar episodes when she has had a cold in the past.  She has tried cough medicine that helped somewhat, but her cough returned.  An albuterol inhaler has helped her symptoms recently.  She denies fever, chills, sweats, sore throat, rhinorrhea, postnasal drip, heartburn, hoarseness.  No shortness of breath, wheezing.  She has sputum that is clear only in the morning.  Her symptoms are worse when she is hot.  She has seasonal allergies and takes Claritin every day, but has not tried nasal sprays.  She has no previous history of asthma; no family history of lung disease.  She is not on an ACE inhibitor.  She recently started on CPAP for OSA but has not noticed a change in her cough.  She has no pets.  No mold exposures.  She smoked for about 6 years prior to quitting in her 15s.  She is unsure how many packs per day she smoked at that time.   Past Medical History:  Diagnosis Date  . Alopecia   . Herpes zoster 1992   posterior thorax  . Hyperlipidemia 2006    LDL 104, HDL 50  . Hypertension   . OA  (osteoarthritis) of knee    Bilateral  . Other abnormal glucose   . Ovarian cyst    Small  . Pre-diabetes   . Uterine fibroid   . Vitamin D deficiency      Family History  Problem Relation Age of Onset  . Diabetes Mother   . Stroke Father 54  . Hypertension Father   . Hypertension Sister   . Hypertension Maternal Aunt   . Diabetes Maternal Aunt   . Cancer Neg Hx   . Colon cancer Neg Hx   . Breast cancer Neg Hx      Past Surgical History:  Procedure Laterality Date  . COLONOSCOPY  2007, 07/25/2016   negative  . CYSTECTOMY     peri rectal  . FOOT SURGERY     bilaterally  . INCONTINENCE SURGERY  2005   Dr Quincy Simmonds  . LIPOMA EXCISION  2011    L chest , Dr Georgette Dover  . SHOULDER ARTHROSCOPY WITH ROTATOR CUFF REPAIR AND SUBACROMIAL DECOMPRESSION Right 10/18/2017   Procedure: Right shoulder arthroscopy, subacromial decompression, labrial debridement mini open rotator cuff repair;  Surgeon: Susa Day, MD;  Location: [facility];  Service: Orthopedics;  Laterality: Right;  120 mins  . SHOULDER ARTHROSCOPY WITH ROTATOR CUFF REPAIR AND SUBACROMIAL DECOMPRESSION Left 07/02/2018   Procedure: Left  shoulder arthroscopy, subacromial decompression, mini open rotator cuff repair,;  Surgeon: Susa Day, MD;  Location: [facility];  Service: Orthopedics;  Laterality: Left;  90 mins    Social History   Socioeconomic History  . Marital status: Single    Spouse name: Not on file  . Number of children: Not on file  . Years of education: Not on file  . Highest education level: Not on file  Occupational History  . Not on file  Tobacco Use  . Smoking status: Former Smoker    Years: 6.00    Types: Cigarettes    Quit date: 06/25/1980    Years since quitting: 39.3  . Smokeless tobacco: Never Used  . Tobacco comment: smoked [age] , up to 1/2 ppd  Substance and Sexual Activity  . Alcohol use: Not Currently  . Drug use: No  . Sexual activity: Yes    Birth control/protection: Post-menopausal  Other  Topics Concern  . Not on file  Social History Narrative   No regular exercise   Social Determinants of Health   Financial Resource Strain:   . Difficulty of Paying Living Expenses:   Food Insecurity:   . Worried About Charity fundraiser in the Last Year:   . Arboriculturist in the Last Year:   Transportation Needs:   . Film/video editor (Medical):   Marland Kitchen Lack of Transportation (Non-Medical):   Physical Activity:   . Days of Exercise per Week:   . Minutes of Exercise per Session:   Stress:   . Feeling of Stress :   Social Connections:   . Frequency of Communication with Friends and Family:   . Frequency of Social Gatherings with Friends and Family:   . Attends Religious Services:   . Active Member of Clubs or Organizations:   . Attends Archivist Meetings:   Marland Kitchen Marital Status:   Intimate Partner Violence:   . Fear of Current or Ex-Partner:   . Emotionally Abused:   Marland Kitchen Physically Abused:   . Sexually Abused:      Allergies  Allergen Reactions  . Benazepril Hcl     REACTION: angioedema of lip. She cannot take angiotensin receptor blockers because of this history.     Immunization History  Administered Date(s) Administered  . Influenza Whole 03/24/2009  . Influenza,inj,Quad PF,6+ Mos 06/08/2019  . PFIZER SARS-COV-2 Vaccination 08/23/2019, 09/16/2019  . Td 02/26/2008  . Tdap 12/16/2018    Outpatient Medications Prior to Visit  Medication Sig Dispense Refill  . amLODipine (NORVASC) 5 MG tablet Take 1 tablet (5 mg total) by mouth daily. 90 tablet 1  . aspirin 81 MG tablet Take 81 mg by mouth daily.    . Biotin 5000 MCG CAPS Take 1 capsule by mouth daily.     . Cholecalciferol (VITAMIN D-3) 1000 UNITS CAPS Take 1 capsule by mouth daily.    . fluticasone (FLONASE) 50 MCG/ACT nasal spray Place 2 sprays into both nostrils daily. 16 g 11  . loratadine (CLARITIN) 10 MG tablet Take 10 mg by mouth daily.    . metoprolol succinate (TOPROL-XL) 100 MG 24 hr tablet  Take 1 tablet (100 mg total) by mouth daily. Take with or immediately following a meal. 90 tablet 1   No facility-administered medications prior to visit.    ROS   Objective:   Vitals:   11/03/19 1347  BP: (!) 152/78  Pulse: 65  Temp: 97.6 F (36.4 C)  TempSrc: Temporal  SpO2: 98%  Weight: 285 lb 3.2 oz (129.4 kg)  Height: 5\' 5"  (1.651 m)   98% on   RA BMI Readings from Last 3 Encounters:  11/03/19 47.46 kg/m  10/08/19 48.10 kg/m  10/05/19 48.75 kg/m   Wt Readings from Last 3 Encounters:  11/03/19 285 lb 3.2 oz (129.4 kg)  10/08/19 284 lb 9.6 oz (129.1 kg)  10/05/19 284 lb (128.8 kg)    Physical Exam Vitals reviewed.  Constitutional:      Appearance: She is obese. She is not ill-appearing.  HENT:     Head: Normocephalic and atraumatic.  Eyes:     General: No scleral icterus. Cardiovascular:     Rate and Rhythm: Normal rate and regular rhythm.  Pulmonary:     Comments: Breathing comfortably on room air, no observed coughing during visit.  Clear to auscultation bilaterally. Abdominal:     General: There is no distension.     Palpations: Abdomen is soft.  Musculoskeletal:        General: No swelling or deformity.     Cervical back: Neck supple.  Lymphadenopathy:     Cervical: No cervical adenopathy.  Skin:    General: Skin is warm and dry.     Findings: No rash.  Neurological:     General: No focal deficit present.     Mental Status: She is alert.     Coordination: Coordination normal.  Psychiatric:        Mood and Affect: Mood normal.        Behavior: Behavior normal.      CBC    Component Value Date/Time   WBC 8.9 12/16/2018 1404   RBC 4.79 12/16/2018 1404   HGB 13.7 12/16/2018 1404   HCT 42.6 12/16/2018 1404   PLT 271.0 12/16/2018 1404   MCV 88.9 12/16/2018 1404   MCH 28.7 06/23/2018 1531   MCHC 32.1 12/16/2018 1404   RDW 14.7 12/16/2018 1404   LYMPHSABS 2.6 12/16/2018 1404   MONOABS 0.8 12/16/2018 1404   EOSABS 0.3 12/16/2018 1404     BASOSABS 0.0 12/16/2018 1404    CHEMISTRY No results for input(s): NA, K, CL, CO2, GLUCOSE, BUN, CREATININE, CALCIUM, MG, PHOS in the last 168 hours. CrCl cannot be calculated (Patient's most recent lab result is older than the maximum 21 days allowed.).   Chest Imaging- films reviewed: None available  Pulmonary Functions Testing Results: PFT Results Latest Ref Rng & Units 11/03/2019  FVC-Pre L 2.33  FVC-Predicted Pre % 86  FVC-Post L 2.39  FVC-Predicted Post % 88  Pre FEV1/FVC % % 87  Post FEV1/FCV % % 87  FEV1-Pre L 2.02  FEV1-Predicted Pre % 95  FEV1-Post L 2.07  DLCO UNC% % 113  DLCO COR %Predicted % 152  TLC L 4.17  TLC % Predicted % 80  RV % Predicted % 78   2021- No significant obstruction or BD reversibility. No significant restriction. Normal diffusion.       Assessment & Plan:     ICD-10-CM   1. Chronic cough  R05   2. Allergic rhinitis, unspecified seasonality, unspecified trigger  J30.9     Chronic cough-suspect she could have mild cough variant asthma that exacerbates after upper respiratory infections.  Certainly component of postnasal drip is also contributing. -Continue loratadine and Flonase daily. -Reviewed PFTs.  No definitive evidence of obstruction that she would benefit from routine bronchodilator use.  I suspect that she may when she has an acute respiratory infection.  Would be helpful to  evaluate her at that time or given empiric trial of albuterol when she is having symptoms.  Allergic rhinosinusitis -Continue Claritin and Flonase  RTC PRN.    Current Outpatient Medications:  .  amLODipine (NORVASC) 5 MG tablet, Take 1 tablet (5 mg total) by mouth daily., Disp: 90 tablet, Rfl: 1 .  aspirin 81 MG tablet, Take 81 mg by mouth daily., Disp: , Rfl:  .  Biotin 5000 MCG CAPS, Take 1 capsule by mouth daily. , Disp: , Rfl:  .  Cholecalciferol (VITAMIN D-3) 1000 UNITS CAPS, Take 1 capsule by mouth daily., Disp: , Rfl:  .  fluticasone (FLONASE)  50 MCG/ACT nasal spray, Place 2 sprays into both nostrils daily., Disp: 16 g, Rfl: 11 .  loratadine (CLARITIN) 10 MG tablet, Take 10 mg by mouth daily., Disp: , Rfl:  .  metoprolol succinate (TOPROL-XL) 100 MG 24 hr tablet, Take 1 tablet (100 mg total) by mouth daily. Take with or immediately following a meal., Disp: 90 tablet, Rfl: 1    Julian Hy, DO [facility] 11/03/2019 2:19 PM

## 2019-11-14 DIAGNOSIS — G4733 Obstructive sleep apnea (adult) (pediatric): Secondary | ICD-10-CM | POA: Diagnosis not present

## 2019-11-26 DIAGNOSIS — M25512 Pain in left shoulder: Secondary | ICD-10-CM | POA: Diagnosis not present

## 2019-12-04 DIAGNOSIS — G4733 Obstructive sleep apnea (adult) (pediatric): Secondary | ICD-10-CM | POA: Diagnosis not present

## 2019-12-06 NOTE — Progress Notes (Signed)
Subjective:    Patient ID: [name], female    DOB: 05/02/1956, 64 y.o.   MRN: [id number]  HPI The patient is here for follow up of their chronic medical problems, including htn, prediabetes, OSA ( severe -dx 06/2019), obesity.  She is using her cpap.   She is exercising regularly.   Going to gym 3-4 times a week.  She could be better with the sugars and carbs.    Medications and allergies reviewed with patient and updated if appropriate.  Patient Active Problem List   Diagnosis Date Noted  . Class 3 obesity with alveolar hypoventilation, serious comorbidity, and body mass index (BMI) of 45.0 to 49.9 in adult ([place]) 06/24/2019  . Sleep apnea, severe 06/24/2019  . Acute cystitis without hematuria 06/10/2019  . Decreased hearing 06/08/2019  . Cough 06/08/2019  . Left rotator cuff tear 07/02/2018  . Right rotator cuff tear 10/18/2017  . Complete rotator cuff tear 10/18/2017  . Chronic pain of both shoulders 04/17/2017  . Bilateral primary osteoarthritis of knee 04/11/2016  . Alopecia areata 09/24/2013  . Vitamin D deficiency 10/01/2012  . Pre-diabetes 03/24/2009  . Essential hypertension 02/26/2008    Current Outpatient Medications on File Prior to Visit  Medication Sig Dispense Refill  . amLODipine (NORVASC) 5 MG tablet Take 1 tablet (5 mg total) by mouth daily. 90 tablet 1  . aspirin 81 MG tablet Take 81 mg by mouth daily.    . Biotin 5000 MCG CAPS Take 1 capsule by mouth daily.     . Cholecalciferol (VITAMIN D-3) 1000 UNITS CAPS Take 1 capsule by mouth daily.    . fluticasone (FLONASE) 50 MCG/ACT nasal spray Place 2 sprays into both nostrils daily. 16 g 11  . loratadine (CLARITIN) 10 MG tablet Take 10 mg by mouth daily.    . metoprolol succinate (TOPROL-XL) 100 MG 24 hr tablet Take 1 tablet (100 mg total) by mouth daily. Take with or immediately following a meal. 90 tablet 1   No current facility-administered medications on file prior to visit.    Past Medical  History:  Diagnosis Date  . Alopecia   . Herpes zoster 1992   posterior thorax  . Hyperlipidemia 2006    LDL 104, HDL 50  . Hypertension   . OA (osteoarthritis) of knee    Bilateral  . Other abnormal glucose   . Ovarian cyst    Small  . Pre-diabetes   . Uterine fibroid   . Vitamin D deficiency     Past Surgical History:  Procedure Laterality Date  . COLONOSCOPY  2007, 07/25/2016   negative  . CYSTECTOMY     peri rectal  . FOOT SURGERY     bilaterally  . INCONTINENCE SURGERY  2005   Dr Quincy Simmonds  . LIPOMA EXCISION  2011    L chest , Dr Georgette Dover  . SHOULDER ARTHROSCOPY WITH ROTATOR CUFF REPAIR AND SUBACROMIAL DECOMPRESSION Right 10/18/2017   Procedure: Right shoulder arthroscopy, subacromial decompression, labrial debridement mini open rotator cuff repair;  Surgeon: Susa Day, MD;  Location: [facility];  Service: Orthopedics;  Laterality: Right;  120 mins  . SHOULDER ARTHROSCOPY WITH ROTATOR CUFF REPAIR AND SUBACROMIAL DECOMPRESSION Left 07/02/2018   Procedure: Left shoulder arthroscopy, subacromial decompression, mini open rotator cuff repair,;  Surgeon: Susa Day, MD;  Location: [facility];  Service: Orthopedics;  Laterality: Left;  90 mins    Social History   Socioeconomic History  . Marital status: Single    Spouse  name: Not on file  . Number of children: Not on file  . Years of education: Not on file  . Highest education level: Not on file  Occupational History  . Not on file  Tobacco Use  . Smoking status: Former Smoker    Years: 6.00    Types: Cigarettes    Quit date: 06/25/1980    Years since quitting: 39.4  . Smokeless tobacco: Never Used  . Tobacco comment: smoked [age] , up to 1/2 ppd  Vaping Use  . Vaping Use: Never used  Substance and Sexual Activity  . Alcohol use: Not Currently  . Drug use: No  . Sexual activity: Yes    Birth control/protection: Post-menopausal  Other Topics Concern  . Not on file  Social History Narrative   No regular exercise    Social Determinants of Health   Financial Resource Strain:   . Difficulty of Paying Living Expenses:   Food Insecurity:   . Worried About Charity fundraiser in the Last Year:   . Arboriculturist in the Last Year:   Transportation Needs:   . Film/video editor (Medical):   Marland Kitchen Lack of Transportation (Non-Medical):   Physical Activity:   . Days of Exercise per Week:   . Minutes of Exercise per Session:   Stress:   . Feeling of Stress :   Social Connections:   . Frequency of Communication with Friends and Family:   . Frequency of Social Gatherings with Friends and Family:   . Attends Religious Services:   . Active Member of Clubs or Organizations:   . Attends Archivist Meetings:   Marland Kitchen Marital Status:     Family History  Problem Relation Age of Onset  . Diabetes Mother   . Stroke Father 17  . Hypertension Father   . Hypertension Sister   . Hypertension Maternal Aunt   . Diabetes Maternal Aunt   . Cancer Neg Hx   . Colon cancer Neg Hx   . Breast cancer Neg Hx     Review of Systems  Constitutional: Negative for chills and fever.  Respiratory: Negative for cough, shortness of breath and wheezing.   Cardiovascular: Negative for chest pain, palpitations and leg swelling.  Musculoskeletal: Positive for arthralgias (knee, shoulders).  Neurological: Negative for dizziness, light-headedness and headaches.       Objective:   Vitals:   12/07/19 1515  BP: (!) 144/84  Pulse: 92  Temp: 98.2 F (36.8 C)  SpO2: 97%   BP Readings from Last 3 Encounters:  12/07/19 (!) 144/84  11/03/19 (!) 152/78  10/08/19 (!) 144/78   Wt Readings from Last 3 Encounters:  12/07/19 283 lb (128.4 kg)  11/03/19 285 lb 3.2 oz (129.4 kg)  10/08/19 284 lb 9.6 oz (129.1 kg)   Body mass index is 47.09 kg/m.   Physical Exam    Constitutional: Appears well-developed and well-nourished. No distress.  HENT:  Head: Normocephalic and atraumatic.  Neck: Neck supple. No tracheal  deviation present. No thyromegaly present.  No cervical lymphadenopathy Cardiovascular: Normal rate, regular rhythm and normal heart sounds.  No murmur heard. No carotid bruit .  No edema Pulmonary/Chest: Effort normal and breath sounds normal. No respiratory distress. No has no wheezes. No rales.  Skin: Skin is warm and dry. Not diaphoretic.  Psychiatric: Normal mood and affect. Behavior is normal.      Assessment & Plan:    See Problem List for Assessment and Plan of chronic  medical problems.    This visit occurred during the SARS-CoV-2 public health emergency.  Safety protocols were in place, including screening questions prior to the visit, additional usage of staff PPE, and extensive cleaning of exam room while observing appropriate contact time as indicated for disinfecting solutions.

## 2019-12-06 NOTE — Patient Instructions (Addendum)
Your BP should be less than 140/90.   Blood work was ordered.     Medications reviewed and updated.  Changes include :   none     Please followup in 6 months

## 2019-12-07 ENCOUNTER — Encounter: Payer: Self-pay | Admitting: Internal Medicine

## 2019-12-07 ENCOUNTER — Other Ambulatory Visit: Payer: Self-pay

## 2019-12-07 ENCOUNTER — Ambulatory Visit: Payer: Federal, State, Local not specified - PPO | Admitting: Internal Medicine

## 2019-12-07 VITALS — BP 144/84 | HR 92 | Temp 98.2°F | Ht 65.0 in | Wt 283.0 lb

## 2019-12-07 DIAGNOSIS — E662 Morbid (severe) obesity with alveolar hypoventilation: Secondary | ICD-10-CM | POA: Diagnosis not present

## 2019-12-07 DIAGNOSIS — Z6841 Body Mass Index (BMI) 40.0 and over, adult: Secondary | ICD-10-CM

## 2019-12-07 DIAGNOSIS — G4730 Sleep apnea, unspecified: Secondary | ICD-10-CM

## 2019-12-07 DIAGNOSIS — I10 Essential (primary) hypertension: Secondary | ICD-10-CM | POA: Diagnosis not present

## 2019-12-07 DIAGNOSIS — R7303 Prediabetes: Secondary | ICD-10-CM

## 2019-12-07 LAB — COMPREHENSIVE METABOLIC PANEL
ALT: 12 U/L (ref 0–35)
AST: 16 U/L (ref 0–37)
Albumin: 4 g/dL (ref 3.5–5.2)
Alkaline Phosphatase: 88 U/L (ref 39–117)
BUN: 18 mg/dL (ref 6–23)
CO2: 30 mEq/L (ref 19–32)
Calcium: 9.5 mg/dL (ref 8.4–10.5)
Chloride: 106 mEq/L (ref 96–112)
Creatinine, Ser: 1.08 mg/dL (ref 0.40–1.20)
GFR: 61.86 mL/min (ref >60.00)
Glucose, Bld: 121 mg/dL — ABNORMAL HIGH (ref 70–99)
Potassium: 4.1 mEq/L (ref 3.5–5.1)
Sodium: 141 mEq/L (ref 135–145)
Total Bilirubin: 0.3 mg/dL (ref 0.2–1.2)
Total Protein: 7.2 g/dL (ref 6.0–8.3)

## 2019-12-07 LAB — HEMOGLOBIN A1C: Hgb A1c MFr Bld: 6.8 % — ABNORMAL HIGH (ref 4.6–6.5)

## 2019-12-07 NOTE — Assessment & Plan Note (Signed)
Discussed importance of weight loss Stressed regular exercise - goal 30 minutes 5 days a week Discussed decreasing portions, decreasing sugars/carbs Increase veges, lean protin  F/u in 6 months  

## 2019-12-07 NOTE — Assessment & Plan Note (Signed)
Chronic Check a1c Low sugar / carb diet Stressed regular exercise  

## 2019-12-07 NOTE — Assessment & Plan Note (Signed)
She is using her cpap nightly

## 2019-12-07 NOTE — Assessment & Plan Note (Signed)
Chronic BP elevated here today - did not take her medication today yet - usually take it in the morning Start monitoring BP at home Continue current medications at current doses cmp

## 2019-12-08 ENCOUNTER — Other Ambulatory Visit: Payer: Self-pay | Admitting: Internal Medicine

## 2019-12-08 DIAGNOSIS — E119 Type 2 diabetes mellitus without complications: Secondary | ICD-10-CM

## 2019-12-14 ENCOUNTER — Other Ambulatory Visit: Payer: Self-pay | Admitting: Internal Medicine

## 2019-12-14 DIAGNOSIS — I10 Essential (primary) hypertension: Secondary | ICD-10-CM

## 2019-12-15 DIAGNOSIS — G4733 Obstructive sleep apnea (adult) (pediatric): Secondary | ICD-10-CM | POA: Diagnosis not present

## 2020-01-07 ENCOUNTER — Ambulatory Visit: Payer: Federal, State, Local not specified - PPO

## 2020-01-07 DIAGNOSIS — Z20822 Contact with and (suspected) exposure to covid-19: Secondary | ICD-10-CM | POA: Diagnosis not present

## 2020-01-07 DIAGNOSIS — Z03818 Encounter for observation for suspected exposure to other biological agents ruled out: Secondary | ICD-10-CM | POA: Diagnosis not present

## 2020-01-14 ENCOUNTER — Ambulatory Visit: Payer: Federal, State, Local not specified - PPO

## 2020-01-14 DIAGNOSIS — G4733 Obstructive sleep apnea (adult) (pediatric): Secondary | ICD-10-CM | POA: Diagnosis not present

## 2020-01-21 ENCOUNTER — Ambulatory Visit: Payer: Federal, State, Local not specified - PPO

## 2020-01-21 ENCOUNTER — Ambulatory Visit: Payer: Federal, State, Local not specified - PPO | Admitting: Dietician

## 2020-01-25 ENCOUNTER — Other Ambulatory Visit: Payer: Self-pay | Admitting: Internal Medicine

## 2020-01-25 DIAGNOSIS — Z1231 Encounter for screening mammogram for malignant neoplasm of breast: Secondary | ICD-10-CM

## 2020-02-04 ENCOUNTER — Other Ambulatory Visit: Payer: Self-pay

## 2020-02-04 ENCOUNTER — Encounter: Payer: Self-pay | Admitting: Dietician

## 2020-02-04 ENCOUNTER — Encounter: Payer: Federal, State, Local not specified - PPO | Attending: Internal Medicine | Admitting: Dietician

## 2020-02-04 DIAGNOSIS — E119 Type 2 diabetes mellitus without complications: Secondary | ICD-10-CM | POA: Diagnosis not present

## 2020-02-04 NOTE — Progress Notes (Signed)
Diabetes Self-Management Education  Visit Type: First/Initial  Appt. Start Time: 1615 Appt. End Time: 1856  02/04/2020  [name], identified by name and date of birth, is a 64 y.o. female with a diagnosis of Diabetes: Type 2.   ASSESSMENT Patient is here today alone.    History includes newly diagnosed Type 2 Diabetes, HTN, OSA- on c-pap A1C 6.8  Meter Provided:   Yes  Brand: Contour Next One Lot #: [id number] Expiration Date:  02/23/20  Patient's niece lives with her and they share shopping and cooking.  She is using the treadmill 1 hour 3-4 days per week. She is retired from the [employer]. Dislikes vegetables but will eat salad with a lot of dressing. Eats fast food frequently. Height 5\' 5"  (1.651 m), weight 279 lb (126.6 kg). Body mass index is 46.43 kg/m.   Diabetes Self-Management Education - 02/04/20 1618      Visit Information   Visit Type First/Initial      Initial Visit   Diabetes Type Type 2    Are you currently following a meal plan? No    Are you taking your medications as prescribed? Not on Medications    Date Diagnosed 11/2019      Health Coping   How would you rate your overall health? Good      Psychosocial Assessment   Patient Belief/Attitude about Diabetes Motivated to manage diabetes    Self-care barriers None    Self-management support Doctor's office    Other persons present Patient    Patient Concerns Nutrition/Meal planning;Glycemic Control;Weight Control    Special Needs None    Preferred Learning Style No preference indicated    Learning Readiness Ready    How often do you need to have someone help you when you read instructions, pamphlets, or other written materials from your doctor or pharmacy? 1 - Never    What is the last grade level you completed in school? 12      Pre-Education Assessment   Patient understands the diabetes disease and treatment process. Needs Instruction    Patient understands incorporating nutritional  management into lifestyle. Needs Instruction    Patient undertands incorporating physical activity into lifestyle. Needs Instruction    Patient understands using medications safely. Needs Instruction    Patient understands monitoring blood glucose, interpreting and using results Needs Instruction    Patient understands prevention, detection, and treatment of acute complications. Needs Instruction    Patient understands prevention, detection, and treatment of chronic complications. Needs Instruction    Patient understands how to develop strategies to address psychosocial issues. Needs Instruction    Patient understands how to develop strategies to promote health/change behavior. Needs Instruction      Complications   Last HgB A1C per patient/outside source 6.8 %   12/07/2019   How often do you check your blood sugar? 0 times/day (not testing)    Have you had a dilated eye exam in the past 12 months? Yes    Have you had a dental exam in the past 12 months? Yes    Are you checking your feet? No      Dietary Intake   Breakfast skips OR cold cereal and milk OR bacon, eggs, toast, occasional coffee with sugar and occasional milk OR chick fil-A sandwich    Snack (morning) none    Lunch Chick Fil-A meal (sandwich, fries, large lemonade) OR BBQ chicken, mashed potatoes, peas   sometimes late   Dinner (late lunch)  Snack (evening) "I eat if I'm hungry" - red hots, peanuts    Beverage(s) water with crystal light, lemonade, cranberry drink, regular soda occasionally,      Exercise   Exercise Type Light (walking / raking leaves)    How many days per week to you exercise? 4    How many minutes per day do you exercise? 60    Total minutes per week of exercise 240      Patient Education   Previous Diabetes Education No    Disease state  Definition of diabetes, type 1 and 2, and the diagnosis of diabetes;Explored patient's options for treatment of their diabetes    Nutrition management  Role of diet  in the treatment of diabetes and the relationship between the three main macronutrients and blood glucose level;Food label reading, portion sizes and measuring food.;Meal options for control of blood glucose level and chronic complications.;Information on hints to eating out and maintain blood glucose control.;Other (comment)   consistent meal schedule   Physical activity and exercise  Role of exercise on diabetes management, blood pressure control and cardiac health.    Monitoring Taught/evaluated SMBG meter.;Taught/discussed recording of test results and interpretation of SMBG.;Identified appropriate SMBG and/or A1C goals.    Acute complications Taught treatment of hypoglycemia - the 15 rule.;Discussed and identified patients' treatment of hyperglycemia.    Chronic complications Relationship between chronic complications and blood glucose control;Lipid levels, blood glucose control and heart disease;Retinopathy and reason for yearly dilated eye exams    Psychosocial adjustment Identified and addressed patients feelings and concerns about diabetes      Individualized Goals (developed by patient)   Nutrition Follow meal plan discussed    Physical Activity Exercise 3-5 times per week;60 minutes per day    Medications take my medication as prescribed    Monitoring  test my blood glucose as discussed    Reducing Risk examine blood glucose patterns;get labs drawn;do foot checks daily;increase portions of healthy fats    Health Coping discuss diabetes with (comment)   MD, RD, CDCDE     Post-Education Assessment   Patient understands the diabetes disease and treatment process. Demonstrates understanding / competency    Patient understands incorporating nutritional management into lifestyle. Needs Review    Patient undertands incorporating physical activity into lifestyle. Demonstrates understanding / competency    Patient understands using medications safely. Demonstrates understanding / competency     Patient understands monitoring blood glucose, interpreting and using results Demonstrates understanding / competency    Patient understands prevention, detection, and treatment of acute complications. Demonstrates understanding / competency    Patient understands prevention, detection, and treatment of chronic complications. Demonstrates understanding / competency    Patient understands how to develop strategies to address psychosocial issues. Demonstrates understanding / competency    Patient understands how to develop strategies to promote health/change behavior. Needs Review      Outcomes   Expected Outcomes Demonstrated interest in learning. Expect positive outcomes    Future DMSE Yearly   or sooner per patient desire.  She wished to wait for 1 year.   Program Status Completed           Individualized Plan for Diabetes Self-Management Training:   Learning Objective:  Patient will have a greater understanding of diabetes self-management. Patient education plan is to attend individual and/or group sessions per assessed needs and concerns.   Plan:   Patient Instructions  Rethink what you drink.  Aim for beverages with zero carbohydrates. Bake  or grill or air fry rather than fry in oil. Be mindful about your meal choices when eating out. Consider checking your blood sugar.  Call your insurance company to find out their preferred meter.  Call your doctor for a prescription for strips and lancing needles. Continue to stay active.  Great job!!   Aim for 4 Carb Choices per meal (60 grams) +/- 1 either way  Aim for 0-1 Carbs per snack if hungry  Include protein in moderation with your meals and snacks Consider reading food labels for Total Carbohydrate of foods Consider  increasing your activity level by walking for 30 or more minutes daily as tolerated.  Please follow up when you are ready.       Expected Outcomes:  Demonstrated interest in learning. Expect positive  outcomes  Education material provided: ADA - How to Thrive: A Guide for Your Journey with Diabetes, Food label handouts, A1C conversion sheet, Meal plan card, My Plate, Snack sheet, Support group flyer, Carbohydrate counting sheet, No sodium seasonings and Diabetes Resources, types of fat, tips for eating out with diabetes  If problems or questions, patient to contact team via:  Phone  Future DSME appointment: Yearly (or sooner per patient desire.  She wished to wait for 1 year.)

## 2020-02-04 NOTE — Patient Instructions (Addendum)
Rethink what you drink.  Aim for beverages with zero carbohydrates. Bake or grill or air fry rather than fry in oil. Be mindful about your meal choices when eating out. Consider checking your blood sugar.  Call your insurance company to find out their preferred meter.  Call your doctor for a prescription for strips and lancing needles. Continue to stay active.  Great job!!   Aim for 4 Carb Choices per meal (60 grams) +/- 1 either way  Aim for 0-1 Carbs per snack if hungry  Include protein in moderation with your meals and snacks Consider reading food labels for Total Carbohydrate of foods Consider  increasing your activity level by walking for 30 or more minutes daily as tolerated.  Please follow up when you are ready.

## 2020-02-11 ENCOUNTER — Ambulatory Visit: Payer: Federal, State, Local not specified - PPO

## 2020-02-14 DIAGNOSIS — G4733 Obstructive sleep apnea (adult) (pediatric): Secondary | ICD-10-CM | POA: Diagnosis not present

## 2020-02-15 ENCOUNTER — Other Ambulatory Visit: Payer: Self-pay | Admitting: Internal Medicine

## 2020-02-15 DIAGNOSIS — E119 Type 2 diabetes mellitus without complications: Secondary | ICD-10-CM

## 2020-02-15 NOTE — Telephone Encounter (Signed)
   Patient calling to request lancets and test strips. Patient states she has a Contour Next One glucometer given to her by diabetes educator [place] ([place]), [employer] [facility] [place]

## 2020-02-16 ENCOUNTER — Other Ambulatory Visit: Payer: Self-pay

## 2020-02-16 MED ORDER — CONTOUR TEST VI STRP: ORAL_STRIP | 3 refills | Status: DC

## 2020-02-16 MED ORDER — LANCETS ULTRA THIN 30G MISC: 3 refills | Status: DC

## 2020-02-18 ENCOUNTER — Ambulatory Visit: Payer: Federal, State, Local not specified - PPO

## 2020-03-04 ENCOUNTER — Other Ambulatory Visit: Payer: Self-pay

## 2020-03-04 ENCOUNTER — Ambulatory Visit
Admission: RE | Admit: 2020-03-04 | Discharge: 2020-03-04 | Disposition: A | Discharge status: Home / Self Care | Payer: Federal, State, Local not specified - PPO | Source: Ambulatory Visit | Attending: Internal Medicine | Admitting: Internal Medicine

## 2020-03-04 DIAGNOSIS — Z1231 Encounter for screening mammogram for malignant neoplasm of breast: Secondary | ICD-10-CM | POA: Diagnosis not present

## 2020-03-16 DIAGNOSIS — G4733 Obstructive sleep apnea (adult) (pediatric): Secondary | ICD-10-CM | POA: Diagnosis not present

## 2020-04-06 ENCOUNTER — Ambulatory Visit: Payer: Federal, State, Local not specified - PPO | Admitting: Adult Health

## 2020-04-06 ENCOUNTER — Encounter: Payer: Self-pay | Admitting: Adult Health

## 2020-04-06 VITALS — BP 128/84 | Ht 65.0 in | Wt 280.0 lb

## 2020-04-06 DIAGNOSIS — Z9989 Dependence on other enabling machines and devices: Secondary | ICD-10-CM | POA: Diagnosis not present

## 2020-04-06 DIAGNOSIS — G4733 Obstructive sleep apnea (adult) (pediatric): Secondary | ICD-10-CM | POA: Diagnosis not present

## 2020-04-06 NOTE — Patient Instructions (Signed)
Continue using CPAP nightly and greater than 4 hours each night °If your symptoms worsen or you develop new symptoms please let us know.  ° °

## 2020-04-06 NOTE — Progress Notes (Signed)
PATIENT: [name] DOB: [birth year]  REASON FOR VISIT: follow up HISTORY FROM: patient  HISTORY OF PRESENT ILLNESS: Today 04/06/20: [name] is a 64 year old female with a history of obstructive sleep apnea on CPAP.  Her download indicates that she used her machine 29 out of 30 days for compliance of 97%.  She used her machine greater than 4 hours 28 days for compliance of 93%.  On average she uses her machine 7 hours and 3 minutes.  Her residual AHI is 1.5 on 6 to 18 cm of water with EPR 2.  Leak in the 95th percentile is 8.6.  The patient states that she has tried to remain faithful to using her CPAP.  Although she has not been able to tell a big difference.  She states that she never really had symptoms before being diagnosed.  She states that she is diabetic with hemoglobin A1c at 6.8.  She states that she has been trying to fix her diet.  Currently she only eats 1 big meal a day.  She returns today for an evaluation.    REVIEW OF SYSTEMS: Out of a complete 14 system review of symptoms, the patient complains only of the following symptoms, and all other reviewed systems are negative.  See HPI  ALLERGIES: Allergies  Allergen Reactions  . Benazepril Hcl     REACTION: angioedema of lip. She cannot take angiotensin receptor blockers because of this history.    HOME MEDICATIONS: Outpatient Medications Prior to Visit  Medication Sig Dispense Refill  . amLODipine (NORVASC) 5 MG tablet Take 1 tablet by mouth once daily 90 tablet 1  . aspirin 81 MG tablet Take 81 mg by mouth daily.    . Biotin 5000 MCG CAPS Take 1 capsule by mouth daily.     . Cholecalciferol (VITAMIN D-3) 1000 UNITS CAPS Take 1 capsule by mouth daily.    . fluticasone (FLONASE) 50 MCG/ACT nasal spray Place 2 sprays into both nostrils daily. 16 g 11  . loratadine (CLARITIN) 10 MG tablet Take 10 mg by mouth daily.    . metoprolol succinate (TOPROL-XL) 100 MG 24 hr tablet TAKE 1 TABLET BY MOUTH ONCE DAILY WITH   OR IMMEDIATELY FOLLOWING A MEAL. 90 tablet 1  . glucose blood (CONTOUR TEST) test strip Use as instructed to check sugars daily.  E11.9 100 each 3  . Lancets Ultra Thin 30G MISC UAD to check sugars daily.  Dx  E11.9 100 each 3   No facility-administered medications prior to visit.    PAST MEDICAL HISTORY: Past Medical History:  Diagnosis Date  . Alopecia   . Diabetes mellitus without complication ([place])   . Herpes zoster 1992   posterior thorax  . Hyperlipidemia 2006    LDL 104, HDL 50  . Hypertension   . OA (osteoarthritis) of knee    Bilateral  . Other abnormal glucose   . Ovarian cyst    Small  . Pre-diabetes   . Uterine fibroid   . Vitamin D deficiency     PAST SURGICAL HISTORY: Past Surgical History:  Procedure Laterality Date  . COLONOSCOPY  2007, 07/25/2016   negative  . CYSTECTOMY     peri rectal  . FOOT SURGERY     bilaterally  . INCONTINENCE SURGERY  2005   Dr Quincy Simmonds  . LIPOMA EXCISION  2011    L chest , Dr Georgette Dover  . SHOULDER ARTHROSCOPY WITH ROTATOR CUFF REPAIR AND SUBACROMIAL DECOMPRESSION Right 10/18/2017   Procedure:  Right shoulder arthroscopy, subacromial decompression, labrial debridement mini open rotator cuff repair;  Surgeon: Susa Day, MD;  Location: [facility];  Service: Orthopedics;  Laterality: Right;  120 mins  . SHOULDER ARTHROSCOPY WITH ROTATOR CUFF REPAIR AND SUBACROMIAL DECOMPRESSION Left 07/02/2018   Procedure: Left shoulder arthroscopy, subacromial decompression, mini open rotator cuff repair,;  Surgeon: Susa Day, MD;  Location: [facility];  Service: Orthopedics;  Laterality: Left;  90 mins    FAMILY HISTORY: Family History  Problem Relation Age of Onset  . Diabetes Mother   . Stroke Father 66  . Hypertension Father   . Hypertension Sister   . Hypertension Maternal Aunt   . Diabetes Maternal Aunt   . Cancer Neg Hx   . Colon cancer Neg Hx   . Breast cancer Neg Hx     SOCIAL HISTORY: Social History   Socioeconomic History  .  Marital status: Single    Spouse name: Not on file  . Number of children: Not on file  . Years of education: Not on file  . Highest education level: Not on file  Occupational History  . Not on file  Tobacco Use  . Smoking status: Former Smoker    Years: 6.00    Types: Cigarettes    Quit date: 06/25/1980    Years since quitting: 39.8  . Smokeless tobacco: Never Used  . Tobacco comment: smoked 16-25 , up to 1/2 ppd  Vaping Use  . Vaping Use: Never used  Substance and Sexual Activity  . Alcohol use: Not Currently  . Drug use: No  . Sexual activity: Yes    Birth control/protection: Post-menopausal  Other Topics Concern  . Not on file  Social History Narrative   No regular exercise   Social Determinants of Health   Financial Resource Strain:   . Difficulty of Paying Living Expenses: Not on file  Food Insecurity:   . Worried About Charity fundraiser in the Last Year: Not on file  . Ran Out of Food in the Last Year: Not on file  Transportation Needs:   . Lack of Transportation (Medical): Not on file  . Lack of Transportation (Non-Medical): Not on file  Physical Activity:   . Days of Exercise per Week: Not on file  . Minutes of Exercise per Session: Not on file  Stress:   . Feeling of Stress : Not on file  Social Connections:   . Frequency of Communication with Friends and Family: Not on file  . Frequency of Social Gatherings with Friends and Family: Not on file  . Attends Religious Services: Not on file  . Active Member of Clubs or Organizations: Not on file  . Attends Archivist Meetings: Not on file  . Marital Status: Not on file  Intimate Partner Violence:   . Fear of Current or Ex-Partner: Not on file  . Emotionally Abused: Not on file  . Physically Abused: Not on file  . Sexually Abused: Not on file      PHYSICAL EXAM  Vitals:   04/06/20 1259  BP: 128/84  Weight: 280 lb (127 kg)  Height: 5\' 5"  (1.651 m)   Body mass index is 46.59  kg/m.  Generalized: Well developed, in no acute distress  Chest: Lungs clear to auscultation bilaterally  Neurological examination  Mentation: Alert oriented to time, place, history taking. Follows all commands speech and language fluent Cranial nerve II-XII: Extraocular movements were full, visual field were full on confrontational test Head turning and  shoulder shrug  were normal and symmetric. Motor: The motor testing reveals 5 over 5 strength of all 4 extremities. Good symmetric motor tone is noted throughout.  Sensory: Sensory testing is intact to soft touch on all 4 extremities. No evidence of extinction is noted.  Gait and station: Gait is normal.    DIAGNOSTIC DATA (LABS, IMAGING, TESTING) - I reviewed patient records, labs, notes, testing and imaging myself where available.  Lab Results  Component Value Date   WBC 8.9 12/16/2018   HGB 13.7 12/16/2018   HCT 42.6 12/16/2018   MCV 88.9 12/16/2018   PLT 271.0 12/16/2018      Component Value Date/Time   NA 141 12/07/2019 1537   K 4.1 12/07/2019 1537   CL 106 12/07/2019 1537   CO2 30 12/07/2019 1537   GLUCOSE 121 (H) 12/07/2019 1537   BUN 18 12/07/2019 1537   CREATININE 1.08 12/07/2019 1537   CALCIUM 9.5 12/07/2019 1537   PROT 7.2 12/07/2019 1537   ALBUMIN 4.0 12/07/2019 1537   AST 16 12/07/2019 1537   ALT 12 12/07/2019 1537   ALKPHOS 88 12/07/2019 1537   BILITOT 0.3 12/07/2019 1537   GFRNONAA 60 (L) 06/23/2018 1531   GFRAA >60 06/23/2018 1531   Lab Results  Component Value Date   CHOL 176 12/16/2018   HDL 48.20 12/16/2018   LDLCALC 113 (H) 12/16/2018   TRIG 75.0 12/16/2018   CHOLHDL 4 12/16/2018   Lab Results  Component Value Date   HGBA1C 6.8 (H) 12/07/2019   No results found for: VITAMINB12 Lab Results  Component Value Date   TSH 2.49 12/16/2018      ASSESSMENT AND PLAN 64 y.o. year old female  has a past medical history of Alopecia, Diabetes mellitus without complication ([place]), Herpes zoster  (1992), Hyperlipidemia (2006), Hypertension, OA (osteoarthritis) of knee, Other abnormal glucose, Ovarian cyst, Pre-diabetes, Uterine fibroid, and Vitamin D deficiency. here with:  1. OSA on CPAP  - CPAP compliance excellent - Good treatment of AHI  - Encourage patient to use CPAP nightly and > 4 hours each night -Also advised patient that adjusting her diet and regular exercise may also be beneficial for her energy levels. - F/U in 1 year or sooner if needed   I spent 25 minutes of face-to-face and non-face-to-face time with patient.  This included previsit chart review, lab review, study review, order entry, electronic health record documentation, patient education.  Ward Givens, MSN, NP-C 04/06/2020, 1:32 PM [facility] [address] [place], [place] [address] [phone]

## 2020-04-15 DIAGNOSIS — G4733 Obstructive sleep apnea (adult) (pediatric): Secondary | ICD-10-CM | POA: Diagnosis not present

## 2020-05-16 DIAGNOSIS — G4733 Obstructive sleep apnea (adult) (pediatric): Secondary | ICD-10-CM | POA: Diagnosis not present

## 2020-05-31 NOTE — Patient Instructions (Addendum)
  Blood work was ordered.     Flu immunization administered today.   Medications changes include :   crestor 5 mg daily for your cholesterol and hydrochlorothiazide 12.5 mg daily for your blood pressure.   Your prescription(s) have been submitted to your pharmacy.    A referral was ordered for podiatry.    Someone will call you to schedule an appointment.    Please followup in 6 months

## 2020-05-31 NOTE — Progress Notes (Signed)
Subjective:    Patient ID: [name], female    DOB: 30-Dec-1955, 64 y.o.   MRN: [id number]  HPI The patient is here for follow up of their chronic medical problems, including htn, DM - dx 6/21, OSA ( severe - dx 06/2019), obesity  She is using her cpap nightly, but she admits that she does not like using it.  She has not felt any benefit from it.  She is exercising some - she is not going to the gym.  She mostly eats one meal a day.  She does not always watch her carbs/sugars.  She may eat breakfast and dinner.    She eats mostly chicken - chicken tenders or fried chicken.       Feet are itchy and dry.  She has tried several things and it has not helped.  She would like to see a podiatrist.  Intermittent right shoulder pain.  Sometimes she can not lift her right arm.  She states she needs to go back to see her shoulder doctor.   Medications and allergies reviewed with patient and updated if appropriate.  Patient Active Problem List   Diagnosis Date Noted  . Class 3 obesity with alveolar hypoventilation, serious comorbidity, and body mass index (BMI) of 45.0 to 49.9 in adult ([place]) 06/24/2019  . Sleep apnea, severe 06/24/2019  . Decreased hearing 06/08/2019  . Cough 06/08/2019  . Left rotator cuff tear 07/02/2018  . Right rotator cuff tear 10/18/2017  . Complete rotator cuff tear 10/18/2017  . Chronic pain of both shoulders 04/17/2017  . Bilateral primary osteoarthritis of knee 04/11/2016  . Alopecia areata 09/24/2013  . Vitamin D deficiency 10/01/2012  . Diabetes mellitus without complication ([place]) 30/86/5784  . Essential hypertension 02/26/2008    Current Outpatient Medications on File Prior to Visit  Medication Sig Dispense Refill  . amLODipine (NORVASC) 5 MG tablet Take 1 tablet by mouth once daily 90 tablet 1  . aspirin 81 MG tablet Take 81 mg by mouth daily.    . Biotin 5000 MCG CAPS Take 1 capsule by mouth daily.     . Cholecalciferol (VITAMIN D-3) 1000 UNITS  CAPS Take 1 capsule by mouth daily.    . fluticasone (FLONASE) 50 MCG/ACT nasal spray Place 2 sprays into both nostrils daily. 16 g 11  . loratadine (CLARITIN) 10 MG tablet Take 10 mg by mouth daily.    . metoprolol succinate (TOPROL-XL) 100 MG 24 hr tablet TAKE 1 TABLET BY MOUTH ONCE DAILY WITH  OR IMMEDIATELY FOLLOWING A MEAL. 90 tablet 1   No current facility-administered medications on file prior to visit.    Past Medical History:  Diagnosis Date  . Alopecia   . Diabetes mellitus without complication ([place])   . Herpes zoster 1992   posterior thorax  . Hyperlipidemia 2006    LDL 104, HDL 50  . Hypertension   . OA (osteoarthritis) of knee    Bilateral  . Other abnormal glucose   . Ovarian cyst    Small  . Pre-diabetes   . Uterine fibroid   . Vitamin D deficiency     Past Surgical History:  Procedure Laterality Date  . COLONOSCOPY  2007, 07/25/2016   negative  . CYSTECTOMY     peri rectal  . FOOT SURGERY     bilaterally  . INCONTINENCE SURGERY  2005   Dr Quincy Simmonds  . LIPOMA EXCISION  2011    L chest , Dr Georgette Dover  . SHOULDER  ARTHROSCOPY WITH ROTATOR CUFF REPAIR AND SUBACROMIAL DECOMPRESSION Right 10/18/2017   Procedure: Right shoulder arthroscopy, subacromial decompression, labrial debridement mini open rotator cuff repair;  Surgeon: Susa Day, MD;  Location: [facility];  Service: Orthopedics;  Laterality: Right;  120 mins  . SHOULDER ARTHROSCOPY WITH ROTATOR CUFF REPAIR AND SUBACROMIAL DECOMPRESSION Left 07/02/2018   Procedure: Left shoulder arthroscopy, subacromial decompression, mini open rotator cuff repair,;  Surgeon: Susa Day, MD;  Location: [facility];  Service: Orthopedics;  Laterality: Left;  90 mins    Social History   Socioeconomic History  . Marital status: Single    Spouse name: Not on file  . Number of children: Not on file  . Years of education: Not on file  . Highest education level: Not on file  Occupational History  . Not on file  Tobacco Use  .  Smoking status: Former Smoker    Years: 6.00    Types: Cigarettes    Quit date: 06/25/1980    Years since quitting: 39.9  . Smokeless tobacco: Never Used  . Tobacco comment: smoked 16-25 , up to 1/2 ppd  Vaping Use  . Vaping Use: Never used  Substance and Sexual Activity  . Alcohol use: Not Currently  . Drug use: No  . Sexual activity: Yes    Birth control/protection: Post-menopausal  Other Topics Concern  . Not on file  Social History Narrative   No regular exercise   Social Determinants of Health   Financial Resource Strain:   . Difficulty of Paying Living Expenses: Not on file  Food Insecurity:   . Worried About Charity fundraiser in the Last Year: Not on file  . Ran Out of Food in the Last Year: Not on file  Transportation Needs:   . Lack of Transportation (Medical): Not on file  . Lack of Transportation (Non-Medical): Not on file  Physical Activity:   . Days of Exercise per Week: Not on file  . Minutes of Exercise per Session: Not on file  Stress:   . Feeling of Stress : Not on file  Social Connections:   . Frequency of Communication with Friends and Family: Not on file  . Frequency of Social Gatherings with Friends and Family: Not on file  . Attends Religious Services: Not on file  . Active Member of Clubs or Organizations: Not on file  . Attends Archivist Meetings: Not on file  . Marital Status: Not on file    Family History  Problem Relation Age of Onset  . Diabetes Mother   . Stroke Father 21  . Hypertension Father   . Hypertension Sister   . Hypertension Maternal Aunt   . Diabetes Maternal Aunt   . Cancer Neg Hx   . Colon cancer Neg Hx   . Breast cancer Neg Hx     Review of Systems  Constitutional: Negative for chills and fever.  Respiratory: Positive for cough (occ). Negative for shortness of breath and wheezing.   Cardiovascular: Negative for chest pain, palpitations and leg swelling.  Neurological: Negative for light-headedness and  headaches.       Objective:   Vitals:   06/01/20 1511  BP: (!) 144/76  Pulse: (!) 58  Temp: 98.4 F (36.9 C)  SpO2: 96%   BP Readings from Last 3 Encounters:  06/01/20 (!) 144/76  04/06/20 128/84  12/07/19 (!) 144/84   Wt Readings from Last 3 Encounters:  06/01/20 277 lb (125.6 kg)  04/06/20 280 lb (127 kg)  02/04/20 279 lb (126.6 kg)   Body mass index is 46.1 kg/m.   Physical Exam    Constitutional: Appears well-developed and well-nourished. No distress.  HENT:  Head: Normocephalic and atraumatic.  Neck: Neck supple. No tracheal deviation present. No thyromegaly present.  No cervical lymphadenopathy Cardiovascular: Normal rate, regular rhythm and normal heart sounds.   No murmur heard. No carotid bruit .  No edema Pulmonary/Chest: Effort normal and breath sounds normal. No respiratory distress. No has no wheezes. No rales.  Skin: Skin is warm and dry. Not diaphoretic.  Psychiatric: Normal mood and affect. Behavior is normal.      Assessment & Plan:    See Problem List for Assessment and Plan of chronic medical problems.    This visit occurred during the SARS-CoV-2 public health emergency.  Safety protocols were in place, including screening questions prior to the visit, additional usage of staff PPE, and extensive cleaning of exam room while observing appropriate contact time as indicated for disinfecting solutions.

## 2020-06-01 ENCOUNTER — Other Ambulatory Visit: Payer: Self-pay

## 2020-06-01 ENCOUNTER — Ambulatory Visit: Payer: Federal, State, Local not specified - PPO | Admitting: Internal Medicine

## 2020-06-01 ENCOUNTER — Encounter: Payer: Self-pay | Admitting: Internal Medicine

## 2020-06-01 VITALS — BP 144/76 | HR 58 | Temp 98.4°F | Ht 65.0 in | Wt 277.0 lb

## 2020-06-01 DIAGNOSIS — G4730 Sleep apnea, unspecified: Secondary | ICD-10-CM | POA: Diagnosis not present

## 2020-06-01 DIAGNOSIS — Z23 Encounter for immunization: Secondary | ICD-10-CM

## 2020-06-01 DIAGNOSIS — E782 Mixed hyperlipidemia: Secondary | ICD-10-CM

## 2020-06-01 DIAGNOSIS — E662 Morbid (severe) obesity with alveolar hypoventilation: Secondary | ICD-10-CM | POA: Diagnosis not present

## 2020-06-01 DIAGNOSIS — I10 Essential (primary) hypertension: Secondary | ICD-10-CM

## 2020-06-01 DIAGNOSIS — E119 Type 2 diabetes mellitus without complications: Secondary | ICD-10-CM

## 2020-06-01 DIAGNOSIS — Z6841 Body Mass Index (BMI) 40.0 and over, adult: Secondary | ICD-10-CM

## 2020-06-01 LAB — MICROALBUMIN / CREATININE URINE RATIO
Creatinine,U: 156.9 mg/dL
Microalb Creat Ratio: 0.4 mg/g (ref 0.0–30.0)
Microalb, Ur: <0.7 mg/dL (ref 0.0–1.9)

## 2020-06-01 LAB — COMPREHENSIVE METABOLIC PANEL
ALT: 10 U/L (ref 0–35)
AST: 15 U/L (ref 0–37)
Albumin: 4.2 g/dL (ref 3.5–5.2)
Alkaline Phosphatase: 88 U/L (ref 39–117)
BUN: 15 mg/dL (ref 6–23)
CO2: 30 mEq/L (ref 19–32)
Calcium: 9.6 mg/dL (ref 8.4–10.5)
Chloride: 103 mEq/L (ref 96–112)
Creatinine, Ser: 0.94 mg/dL (ref 0.40–1.20)
GFR: 64.28 mL/min (ref >60.00)
Glucose, Bld: 81 mg/dL (ref 70–99)
Potassium: 3.9 mEq/L (ref 3.5–5.1)
Sodium: 139 mEq/L (ref 135–145)
Total Bilirubin: 0.4 mg/dL (ref 0.2–1.2)
Total Protein: 7.8 g/dL (ref 6.0–8.3)

## 2020-06-01 LAB — LIPID PANEL
Cholesterol: 157 mg/dL (ref 0–200)
HDL: 44.4 mg/dL (ref >39.00)
LDL Cholesterol: 92 mg/dL (ref 0–99)
NonHDL: 112.59
Total CHOL/HDL Ratio: 4
Triglycerides: 101 mg/dL (ref 0.0–149.0)
VLDL: 20.2 mg/dL (ref 0.0–40.0)

## 2020-06-01 LAB — HEMOGLOBIN A1C: Hgb A1c MFr Bld: 6.2 % (ref 4.6–6.5)

## 2020-06-01 MED ORDER — HYDROCHLOROTHIAZIDE 12.5 MG PO TABS: 12.5000 mg | ORAL_TABLET | Freq: Every day | ORAL | 3 refills | Status: DC

## 2020-06-01 MED ORDER — ROSUVASTATIN CALCIUM 5 MG PO TABS
5.0000 mg | ORAL_TABLET | Freq: Every day | ORAL | 3 refills | Status: DC
Start: 2020-06-01 — End: 2021-09-22

## 2020-06-01 NOTE — Assessment & Plan Note (Addendum)
chronic Using cpap nightly - she is very compliant Stressed compliance and concerns of complications from untreated sleep apnea

## 2020-06-01 NOTE — Assessment & Plan Note (Signed)
Chronic Hospitalist minimally elevated, but with her diagnosis of diabetes discussed that she should be on a statin-start Crestor 5 mg daily Stressed heart healthy diet, regular exercise and weight loss Lipid panel,cmp

## 2020-06-01 NOTE — Assessment & Plan Note (Signed)
Chronic Not ideally controlled Continue amlodipine 5 mg daily, metoprolol XL 100 mg daily Start hydrochlorothiazide 12.5 mg daily Stressed low-sodium diet, regular exercise and weight loss CMP

## 2020-06-01 NOTE — Assessment & Plan Note (Addendum)
Discussed importance of weight loss Stressed regular exercise - goal 30 minutes 5 days a week Discussed decreasing portions, decreasing sugars/carbs Increase veges, lean protin Has seen nutrition in the past F/u in 6 months

## 2020-06-01 NOTE — Assessment & Plan Note (Signed)
Chronic Discussed the importance of a low sugar/carbohydrate diet, regular exercise and weight loss Currently diet controlled Check A1c, urine microalbumin We will refer to podiatry for foot concerns and diabetic check Advise getting an eye exam once a year

## 2020-06-14 ENCOUNTER — Ambulatory Visit: Payer: Federal, State, Local not specified - PPO | Admitting: Podiatry

## 2020-06-14 ENCOUNTER — Other Ambulatory Visit: Payer: Self-pay

## 2020-06-14 ENCOUNTER — Encounter: Payer: Self-pay | Admitting: Podiatry

## 2020-06-14 DIAGNOSIS — L603 Nail dystrophy: Secondary | ICD-10-CM

## 2020-06-14 DIAGNOSIS — B353 Tinea pedis: Secondary | ICD-10-CM | POA: Diagnosis not present

## 2020-06-14 MED ORDER — KETOCONAZOLE 2 % EX CREA
1.0000 | TOPICAL_CREAM | Freq: Two times a day (BID) | CUTANEOUS | 2 refills | Status: DC
Start: 2020-06-14 — End: 2020-11-30

## 2020-06-14 NOTE — Progress Notes (Signed)
Subjective:  Patient ID: [name], female    DOB: 24-Mar-1956,  MRN: [id number] HPI Chief Complaint  Patient presents with   Diabetes    Requesting foot exam - check feet, skin peeling on feet, thick and dark-injured years ago, last a1c was 6.2   New Patient (Initial Visit)    64 y.o. female presents with the above complaint.   ROS: Denies fever chills nausea vomiting muscle aches pains calf pain back pain chest pain shortness of breath.  Past Medical History:  Diagnosis Date   Alopecia    Diabetes mellitus without complication ([place])    Herpes zoster 1992   posterior thorax   Hyperlipidemia 2006    LDL 104, HDL 50   Hypertension    OA (osteoarthritis) of knee    Bilateral   Other abnormal glucose    Ovarian cyst    Small   Pre-diabetes    Uterine fibroid    Vitamin D deficiency    Past Surgical History:  Procedure Laterality Date   COLONOSCOPY  2007, 07/25/2016   negative   CYSTECTOMY     peri rectal   FOOT SURGERY     bilaterally   INCONTINENCE SURGERY  2005   Dr Quincy Simmonds   LIPOMA EXCISION  2011    L chest , Dr Georgette Dover   SHOULDER ARTHROSCOPY WITH ROTATOR CUFF REPAIR AND SUBACROMIAL DECOMPRESSION Right 10/18/2017   Procedure: Right shoulder arthroscopy, subacromial decompression, labrial debridement mini open rotator cuff repair;  Surgeon: Susa Day, MD;  [facility];  Service: Orthopedics;  Laterality: Right;  120 mins   SHOULDER ARTHROSCOPY WITH ROTATOR CUFF REPAIR AND SUBACROMIAL DECOMPRESSION Left 07/02/2018   Procedure: Left shoulder arthroscopy, subacromial decompression, mini open rotator cuff repair,;  Surgeon: Susa Day, MD;  [facility];  Service: Orthopedics;  Laterality: Left;  90 mins    Current Outpatient Medications:    amLODipine (NORVASC) 5 MG tablet, Take 1 tablet by mouth once daily, Disp: 90 tablet, Rfl: 1   aspirin 81 MG tablet, Take 81 mg by mouth daily., Disp: , Rfl:    Biotin 5000 MCG CAPS, Take 1  capsule by mouth daily. , Disp: , Rfl:    Cholecalciferol (VITAMIN D-3) 1000 UNITS CAPS, Take 1 capsule by mouth daily., Disp: , Rfl:    fluticasone (FLONASE) 50 MCG/ACT nasal spray, Place 2 sprays into both nostrils daily., Disp: 16 g, Rfl: 11   hydrochlorothiazide (HYDRODIURIL) 12.5 MG tablet, Take 1 tablet (12.5 mg total) by mouth daily., Disp: 90 tablet, Rfl: 3   ketoconazole (NIZORAL) 2 % cream, Apply 1 application topically 2 (two) times daily., Disp: 15 g, Rfl: 2   loratadine (CLARITIN) 10 MG tablet, Take 10 mg by mouth daily., Disp: , Rfl:    metoprolol succinate (TOPROL-XL) 100 MG 24 hr tablet, TAKE 1 TABLET BY MOUTH ONCE DAILY WITH  OR IMMEDIATELY FOLLOWING A MEAL., Disp: 90 tablet, Rfl: 1   rosuvastatin (CRESTOR) 5 MG tablet, Take 1 tablet (5 mg total) by mouth daily., Disp: 90 tablet, Rfl: 3  Allergies  Allergen Reactions   Benazepril Hcl     REACTION: angioedema of lip. She cannot take angiotensin receptor blockers because of this history.   Review of Systems Objective:  There were no vitals filed for this visit.  General: Well developed, nourished, in no acute distress, alert and oriented x3   Dermatological: Skin is warm, dry and supple bilateral. Nails x 10 are well maintained; remaining integument appears unremarkable at this time.  There are no open sores, no preulcerative lesions, no rash or signs of infection present.  Dry scaly skin with small multiple petechial type lesions small ruptured vesicles bilaterally right greater than left.  Hallux nail right is thick discolored and dystrophic.  Vascular: Dorsalis Pedis artery and Posterior Tibial artery pedal pulses are 2/4 bilateral with immedate capillary fill time. Pedal hair growth present. No varicosities and no lower extremity edema present bilateral.   Neruologic: Grossly intact via light touch bilateral. Vibratory intact via tuning fork bilateral. Protective threshold with Semmes Wienstein monofilament intact  to all pedal sites bilateral. Patellar and Achilles deep tendon reflexes 2+ bilateral. No Babinski or clonus noted bilateral.   Musculoskeletal: No gross boney pedal deformities bilateral. No pain, crepitus, or limitation noted with foot and ankle range of motion bilateral. Muscular strength 5/5 in all groups tested bilateral.  Gait: Unassisted, Nonantalgic.    Radiographs:  None taken  Assessment & Plan:   Assessment: Tinea pedis with nail dystrophy hallux right.  Diabetes type 2 without complications.  Plan: Discussed etiology pathology conservative versus surgical therapies.  At this point I took samples of the skin and nail to be sent for pathologic evaluation went ahead and get her started on some ketoconazole I will follow-up with her in 1 month.     Elda Dunkerson T. [place], [place]

## 2020-06-15 ENCOUNTER — Other Ambulatory Visit: Payer: Self-pay | Admitting: Internal Medicine

## 2020-06-15 DIAGNOSIS — I10 Essential (primary) hypertension: Secondary | ICD-10-CM

## 2020-07-12 ENCOUNTER — Encounter: Payer: Self-pay | Admitting: Podiatry

## 2020-07-12 ENCOUNTER — Ambulatory Visit: Payer: Federal, State, Local not specified - PPO | Admitting: Podiatry

## 2020-07-12 ENCOUNTER — Other Ambulatory Visit: Payer: Self-pay

## 2020-07-12 DIAGNOSIS — L603 Nail dystrophy: Secondary | ICD-10-CM | POA: Diagnosis not present

## 2020-07-12 MED ORDER — TERBINAFINE HCL 250 MG PO TABS: 250.0000 mg | ORAL_TABLET | Freq: Every day | ORAL | 0 refills | Status: DC

## 2020-07-12 NOTE — Progress Notes (Signed)
She presents today for her pathology.  Objective: Vital signs are stable alert and oriented x3.  Pulses are palpable.  Pathology result does demonstrate microtrauma as well as onychomycosis T rubrum.  Assessment: Nail dystrophy onychomycosis.  Plan: We discussed the 3 therapies.  Topical therapy laser therapy and oral therapy.  At this point she wants to do oral therapy she has already had a complete metabolic panel done which looks good.  So I started her on Lamisil 250 mg tablets 1 p.o. nightly follow-up with me in 1 month for another set of blood work.

## 2020-07-14 ENCOUNTER — Encounter: Payer: Self-pay | Admitting: *Deleted

## 2020-07-19 DIAGNOSIS — G4733 Obstructive sleep apnea (adult) (pediatric): Secondary | ICD-10-CM | POA: Diagnosis not present

## 2020-08-11 ENCOUNTER — Ambulatory Visit: Payer: Federal, State, Local not specified - PPO | Admitting: Podiatry

## 2020-08-11 ENCOUNTER — Encounter: Payer: Self-pay | Admitting: Podiatry

## 2020-08-11 ENCOUNTER — Other Ambulatory Visit: Payer: Self-pay

## 2020-08-11 DIAGNOSIS — Z79899 Other long term (current) drug therapy: Secondary | ICD-10-CM

## 2020-08-11 DIAGNOSIS — L603 Nail dystrophy: Secondary | ICD-10-CM

## 2020-08-11 MED ORDER — TERBINAFINE HCL 250 MG PO TABS: 250.0000 mg | ORAL_TABLET | Freq: Every day | ORAL | 0 refills | Status: DC

## 2020-08-12 LAB — COMPREHENSIVE METABOLIC PANEL
AG Ratio: 1.5 (calc) (ref 1.0–2.5)
ALT: 13 U/L (ref 6–29)
AST: 17 U/L (ref 10–35)
Albumin: 4 g/dL (ref 3.6–5.1)
Alkaline phosphatase (APISO): 84 U/L (ref 37–153)
BUN/Creatinine Ratio: 20 (calc) (ref 6–22)
BUN: 22 mg/dL (ref 7–25)
CO2: 31 mmol/L (ref 20–32)
Calcium: 9.5 mg/dL (ref 8.6–10.4)
Chloride: 106 mmol/L (ref 98–110)
Creat: 1.12 mg/dL — ABNORMAL HIGH (ref 0.50–0.99)
Globulin: 2.7 g/dL (calc) (ref 1.9–3.7)
Glucose, Bld: 105 mg/dL (ref 65–139)
Potassium: 4 mmol/L (ref 3.5–5.3)
Sodium: 144 mmol/L (ref 135–146)
Total Bilirubin: 0.3 mg/dL (ref 0.2–1.2)
Total Protein: 6.7 g/dL (ref 6.1–8.1)

## 2020-08-14 NOTE — Progress Notes (Signed)
She presents today for follow-up of her Lamisil therapy states that she can already see clearing of the skin and the toenails.  She denies fever chills nausea vomiting muscle aches pains calf pain back pain chest pain shortness of breath itching or rashes.  Objective: No change in physical exam.  Assessment: Slowly resolving onychomycosis tinea pedis.  Plan: At this point we will request blood work.  Also wrote another prescription for 90 days of Lamisil 1 tablet daily follow-up with me in 4 months.  Call with questions or concerns.

## 2020-09-13 ENCOUNTER — Other Ambulatory Visit: Payer: Self-pay | Admitting: Internal Medicine

## 2020-09-13 DIAGNOSIS — I10 Essential (primary) hypertension: Secondary | ICD-10-CM

## 2020-09-21 DIAGNOSIS — Z01419 Encounter for gynecological examination (general) (routine) without abnormal findings: Secondary | ICD-10-CM | POA: Diagnosis not present

## 2020-09-21 DIAGNOSIS — Z124 Encounter for screening for malignant neoplasm of cervix: Secondary | ICD-10-CM | POA: Diagnosis not present

## 2020-09-21 DIAGNOSIS — Z6841 Body Mass Index (BMI) 40.0 and over, adult: Secondary | ICD-10-CM | POA: Diagnosis not present

## 2020-10-27 DIAGNOSIS — K029 Dental caries, unspecified: Secondary | ICD-10-CM | POA: Diagnosis not present

## 2020-11-29 ENCOUNTER — Encounter: Payer: Self-pay | Admitting: Internal Medicine

## 2020-11-29 NOTE — Patient Instructions (Addendum)
  Blood work was ordered.       Medications changes include :        Please followup in 6 months

## 2020-11-29 NOTE — Progress Notes (Signed)
Subjective:    Patient ID: [name], female    DOB: 1956/05/15, 65 y.o.   MRN: [id number]  HPI The patient is here for follow up of their chronic medical problems, including htn, DM dx 6/21, hld, OSA ( severe dx 06/2019), obesity  She has cut back on bread.  She still eats sweets, but not as bad as she she used to.   She is walking intermittent.  She finds her self walking for a while and then will stop walking for a while and then get back into it.  She is trying to walk 3 miles currently.  She is frustrated by not being able to lose weight.    Medications and allergies reviewed with patient and updated if appropriate.  Patient Active Problem List   Diagnosis Date Noted  . Class 3 obesity with alveolar hypoventilation, serious comorbidity, and body mass index (BMI) of 45.0 to 49.9 in adult ([place]) 06/24/2019  . Sleep apnea, severe 06/24/2019  . Decreased hearing 06/08/2019  . Cough 06/08/2019  . Stiffness of left shoulder joint 08/15/2018  . Left rotator cuff tear 07/02/2018  . Pain in joint of right shoulder 11/05/2017  . Right rotator cuff tear 10/18/2017  . Complete rotator cuff tear 10/18/2017  . Chronic pain of both shoulders 04/17/2017  . Bilateral primary osteoarthritis of knee 04/11/2016  . Alopecia areata 09/24/2013  . Vitamin D deficiency 10/01/2012  . Diabetes mellitus without complication ([place]) 95/62/1308  . Hyperlipidemia 02/26/2008  . Essential hypertension 02/26/2008    Current Outpatient Medications on File Prior to Visit  Medication Sig Dispense Refill  . amLODipine (NORVASC) 5 MG tablet Take 1 tablet by mouth once daily 90 tablet 0  . aspirin 81 MG tablet Take 81 mg by mouth daily.    . Biotin 5000 MCG CAPS Take 1 capsule by mouth daily.     . Cholecalciferol (VITAMIN D-3) 1000 UNITS CAPS Take 1 capsule by mouth daily.    . hydrochlorothiazide (HYDRODIURIL) 12.5 MG tablet Take 1 tablet (12.5 mg total) by mouth daily. 90 tablet 3  . loratadine  (CLARITIN) 10 MG tablet Take 10 mg by mouth daily.    . metoprolol succinate (TOPROL-XL) 100 MG 24 hr tablet TAKE 1 TABLET BY MOUTH ONCE DAILY OR IMMEDIATELY FOLLOWING A MEAL 90 tablet 0  . rosuvastatin (CRESTOR) 5 MG tablet Take 1 tablet (5 mg total) by mouth daily. 90 tablet 3  . terbinafine (LAMISIL) 250 MG tablet Take 1 tablet (250 mg total) by mouth daily. 90 tablet 0   No current facility-administered medications on file prior to visit.    Past Medical History:  Diagnosis Date  . Alopecia   . Diabetes mellitus without complication ([place])   . Herpes zoster 1992   posterior thorax  . Hyperlipidemia 2006    LDL 104, HDL 50  . Hypertension   . OA (osteoarthritis) of knee    Bilateral  . Other abnormal glucose   . Ovarian cyst    Small  . Pre-diabetes   . Uterine fibroid   . Vitamin D deficiency     Past Surgical History:  Procedure Laterality Date  . COLONOSCOPY  2007, 07/25/2016   negative  . CYSTECTOMY     peri rectal  . FOOT SURGERY     bilaterally  . INCONTINENCE SURGERY  2005   Dr Quincy Simmonds  . LIPOMA EXCISION  2011    L chest , Dr Georgette Dover  . SHOULDER ARTHROSCOPY WITH ROTATOR CUFF  REPAIR AND SUBACROMIAL DECOMPRESSION Right 10/18/2017   Procedure: Right shoulder arthroscopy, subacromial decompression, labrial debridement mini open rotator cuff repair;  Surgeon: Susa Day, MD;  Location: [facility];  Service: Orthopedics;  Laterality: Right;  120 mins  . SHOULDER ARTHROSCOPY WITH ROTATOR CUFF REPAIR AND SUBACROMIAL DECOMPRESSION Left 07/02/2018   Procedure: Left shoulder arthroscopy, subacromial decompression, mini open rotator cuff repair,;  Surgeon: Susa Day, MD;  Location: [facility];  Service: Orthopedics;  Laterality: Left;  90 mins    Social History   Socioeconomic History  . Marital status: Single    Spouse name: Not on file  . Number of children: Not on file  . Years of education: Not on file  . Highest education level: Not on file  Occupational History  .  Not on file  Tobacco Use  . Smoking status: Former Smoker    Years: 6.00    Types: Cigarettes    Quit date: 06/25/1980    Years since quitting: 40.4  . Smokeless tobacco: Never Used  . Tobacco comment: smoked 16-25 , up to 1/2 ppd  Vaping Use  . Vaping Use: Never used  Substance and Sexual Activity  . Alcohol use: Not Currently  . Drug use: No  . Sexual activity: Yes    Birth control/protection: Post-menopausal  Other Topics Concern  . Not on file  Social History Narrative   No regular exercise   Social Determinants of Health   Financial Resource Strain: Not on file  Food Insecurity: Not on file  Transportation Needs: Not on file  Physical Activity: Not on file  Stress: Not on file  Social Connections: Not on file    Family History  Problem Relation Age of Onset  . Diabetes Mother   . Stroke Father 53  . Hypertension Father   . Hypertension Sister   . Hypertension Maternal Aunt   . Diabetes Maternal Aunt   . Cancer Neg Hx   . Colon cancer Neg Hx   . Breast cancer Neg Hx     Review of Systems  Constitutional: Negative for chills and fever.  Respiratory: Positive for cough (dry, chronic). Negative for shortness of breath and wheezing.   Cardiovascular: Negative for chest pain, palpitations and leg swelling.  Musculoskeletal: Positive for arthralgias (knees). Negative for myalgias.  Neurological: Negative for light-headedness and headaches.       Objective:   Vitals:   11/30/20 1540  BP: 128/78  Pulse: 63  Temp: 98.4 F (36.9 C)  SpO2: 97%   BP Readings from Last 3 Encounters:  11/30/20 128/78  06/01/20 (!) 144/76  04/06/20 128/84   Wt Readings from Last 3 Encounters:  11/30/20 284 lb (128.8 kg)  06/01/20 277 lb (125.6 kg)  04/06/20 280 lb (127 kg)   Body mass index is 47.26 kg/m.   Physical Exam    Constitutional: Appears well-developed and well-nourished. No distress.  HENT:  Head: Normocephalic and atraumatic.  Neck: Neck supple. No  tracheal deviation present. No thyromegaly present.  No cervical lymphadenopathy Cardiovascular: Normal rate, regular rhythm and normal heart sounds.   No murmur heard. No carotid bruit .  No edema Pulmonary/Chest: Effort normal and breath sounds normal. No respiratory distress. No has no wheezes. No rales.  Skin: Skin is warm and dry. Not diaphoretic.  Psychiatric: Normal mood and affect. Behavior is normal.      Assessment & Plan:    See Problem List for Assessment and Plan of chronic medical problems.    This  visit occurred during the SARS-CoV-2 public health emergency.  Safety protocols were in place, including screening questions prior to the visit, additional usage of staff PPE, and extensive cleaning of exam room while observing appropriate contact time as indicated for disinfecting solutions.

## 2020-11-30 ENCOUNTER — Ambulatory Visit: Payer: Federal, State, Local not specified - PPO | Admitting: Internal Medicine

## 2020-11-30 ENCOUNTER — Other Ambulatory Visit: Payer: Self-pay

## 2020-11-30 VITALS — BP 128/78 | HR 63 | Temp 98.4°F | Ht 65.0 in | Wt 284.0 lb

## 2020-11-30 DIAGNOSIS — G4730 Sleep apnea, unspecified: Secondary | ICD-10-CM

## 2020-11-30 DIAGNOSIS — I10 Essential (primary) hypertension: Secondary | ICD-10-CM | POA: Diagnosis not present

## 2020-11-30 DIAGNOSIS — E119 Type 2 diabetes mellitus without complications: Secondary | ICD-10-CM

## 2020-11-30 DIAGNOSIS — E782 Mixed hyperlipidemia: Secondary | ICD-10-CM | POA: Diagnosis not present

## 2020-11-30 NOTE — Assessment & Plan Note (Signed)
Chronic Blood pressure well controlled Continue hydrochlorothiazide 12.5 mg daily, amlodipine 5 mg daily, metoprolol XL 100 mg daily CMP

## 2020-11-30 NOTE — Assessment & Plan Note (Signed)
Chronic She is using CPAP nightly, but does not feel any different using it Stressed the importance of continuing using it on a nightly basis.  Discussed complications of untreated sleep apnea

## 2020-11-30 NOTE — Assessment & Plan Note (Signed)
Chronic Check lipid panel Continue Crestor 5 mg daily Continue regular exercise Encouraged heart healthy diet-trying to reduce fried foods

## 2020-11-30 NOTE — Assessment & Plan Note (Signed)
Chronic She is walking currently, but is not always consistent with it-encouraged continuing walking on a regular basis She has made some improvements to her diet, but still could do better Sugars are controlled with diet at this point and she would like to avoid medication if possible Check A1c Encouraged her to decrease her portions to help with weight loss Discussed that we could consider starting a medication will help control her sugars but may also help with weight loss and she deferred at this time

## 2020-12-01 ENCOUNTER — Ambulatory Visit: Payer: Federal, State, Local not specified - PPO | Admitting: Podiatry

## 2020-12-01 DIAGNOSIS — L603 Nail dystrophy: Secondary | ICD-10-CM

## 2020-12-01 LAB — COMPREHENSIVE METABOLIC PANEL
ALT: 12 U/L (ref 0–35)
AST: 17 U/L (ref 0–37)
Albumin: 4.1 g/dL (ref 3.5–5.2)
Alkaline Phosphatase: 70 U/L (ref 39–117)
BUN: 21 mg/dL (ref 6–23)
CO2: 32 mEq/L (ref 19–32)
Calcium: 9.3 mg/dL (ref 8.4–10.5)
Chloride: 102 mEq/L (ref 96–112)
Creatinine, Ser: 1.16 mg/dL (ref 0.40–1.20)
GFR: 49.77 mL/min — ABNORMAL LOW (ref >60.00)
Glucose, Bld: 70 mg/dL (ref 70–99)
Potassium: 4.1 mEq/L (ref 3.5–5.1)
Sodium: 139 mEq/L (ref 135–145)
Total Bilirubin: 0.3 mg/dL (ref 0.2–1.2)
Total Protein: 7.6 g/dL (ref 6.0–8.3)

## 2020-12-01 LAB — LIPID PANEL
Cholesterol: 123 mg/dL (ref 0–200)
HDL: 44.8 mg/dL (ref >39.00)
LDL Cholesterol: 50 mg/dL (ref 0–99)
NonHDL: 78.45
Total CHOL/HDL Ratio: 3
Triglycerides: 143 mg/dL (ref 0.0–149.0)
VLDL: 28.6 mg/dL (ref 0.0–40.0)

## 2020-12-01 LAB — HEMOGLOBIN A1C: Hgb A1c MFr Bld: 6.8 % — ABNORMAL HIGH (ref 4.6–6.5)

## 2020-12-01 MED ORDER — TERBINAFINE HCL 250 MG PO TABS: 250.0000 mg | ORAL_TABLET | Freq: Every day | ORAL | 0 refills | Status: DC

## 2020-12-04 NOTE — Progress Notes (Signed)
She presents today for follow-up of her Lamisil therapy.  States that is doing just fine denies fever chills Muscle aches pains calf pain back pain chest pain shortness of breath itching or rashes.  Objective: Vital signs stable oriented x3 nailplates.  Be growing out by about 50% at this point.  Assessment: Long-term therapy with Lamisil for onychomycosis.  Plan: I am going to request that she continue 1 Lamisil tablet every other day for the next 60 days.  Dispense 30 tablets 1 p.o. q. OD and I will follow-up with her in 3 months.  Should she have questions or concerns she will notify us immediately.

## 2020-12-08 ENCOUNTER — Other Ambulatory Visit: Payer: Self-pay | Admitting: Internal Medicine

## 2020-12-08 DIAGNOSIS — I10 Essential (primary) hypertension: Secondary | ICD-10-CM

## 2021-02-22 ENCOUNTER — Other Ambulatory Visit: Payer: Self-pay | Admitting: Internal Medicine

## 2021-02-22 DIAGNOSIS — Z1231 Encounter for screening mammogram for malignant neoplasm of breast: Secondary | ICD-10-CM

## 2021-03-02 ENCOUNTER — Other Ambulatory Visit: Payer: Self-pay

## 2021-03-02 ENCOUNTER — Ambulatory Visit: Payer: Federal, State, Local not specified - PPO | Admitting: Podiatry

## 2021-03-02 ENCOUNTER — Encounter: Payer: Self-pay | Admitting: Podiatry

## 2021-03-02 DIAGNOSIS — L603 Nail dystrophy: Secondary | ICD-10-CM | POA: Diagnosis not present

## 2021-03-02 MED ORDER — TERBINAFINE HCL 250 MG PO TABS: 250.0000 mg | ORAL_TABLET | Freq: Every day | ORAL | 0 refills | Status: DC

## 2021-03-02 NOTE — Progress Notes (Signed)
She presents today for follow-up of her nail fungus she is completed her first every other day dosing cycle and has been off the medication now for about a month she states she states that they look much better.  Objective: Vital signs are stable alert oriented x3.  Pulses are palpable.  Nails are nearly completely grown out at this point.  Assessment: Resolving onychomycosis long-term therapy with Lamisil.  Plan: I think 1 more dose may clear these nails of 100% so we will start her back on Lamisil 250 mg tablets #31 p.o. q. OD and I will follow-up with her in 3 months.

## 2021-03-07 ENCOUNTER — Ambulatory Visit: Payer: Federal, State, Local not specified - PPO | Admitting: Podiatry

## 2021-03-08 ENCOUNTER — Other Ambulatory Visit: Payer: Self-pay | Admitting: Internal Medicine

## 2021-03-08 DIAGNOSIS — I10 Essential (primary) hypertension: Secondary | ICD-10-CM

## 2021-03-09 ENCOUNTER — Ambulatory Visit
Admission: RE | Admit: 2021-03-09 | Discharge: 2021-03-09 | Disposition: A | Discharge status: Home / Self Care | Payer: Federal, State, Local not specified - PPO | Source: Ambulatory Visit | Attending: Internal Medicine | Admitting: Internal Medicine

## 2021-03-09 DIAGNOSIS — Z1231 Encounter for screening mammogram for malignant neoplasm of breast: Secondary | ICD-10-CM | POA: Diagnosis not present

## 2021-04-04 DIAGNOSIS — G4733 Obstructive sleep apnea (adult) (pediatric): Secondary | ICD-10-CM | POA: Diagnosis not present

## 2021-04-06 ENCOUNTER — Ambulatory Visit: Payer: Federal, State, Local not specified - PPO | Admitting: Adult Health

## 2021-05-23 ENCOUNTER — Other Ambulatory Visit: Payer: Self-pay | Admitting: Internal Medicine

## 2021-06-01 ENCOUNTER — Other Ambulatory Visit: Payer: Self-pay

## 2021-06-01 ENCOUNTER — Ambulatory Visit: Payer: Medicare Other | Admitting: Podiatry

## 2021-06-01 DIAGNOSIS — L603 Nail dystrophy: Secondary | ICD-10-CM

## 2021-06-01 MED ORDER — TERBINAFINE HCL 250 MG PO TABS: 250.0000 mg | ORAL_TABLET | Freq: Every day | ORAL | 0 refills | Status: DC

## 2021-06-03 NOTE — Progress Notes (Signed)
She presents today stating that she has completed her Lamisil prescription as directed.  Patient states that she has noticed some improvement in the nails appearance but believes that the nails are still brittle and have some way to go.  Objective: Vital signs are stable alert and oriented x3.  Nails are improving quite considerably and I am happy to see these grow out as quick as they are.  Assessment: Well-healing onychomycosis long-term use of Lamisil.  Plan: We will start her on her first dose of Lamisil 1 tablet every other day follow-up with her in 3 months

## 2021-06-08 ENCOUNTER — Other Ambulatory Visit: Payer: Self-pay | Admitting: Internal Medicine

## 2021-06-08 DIAGNOSIS — I10 Essential (primary) hypertension: Secondary | ICD-10-CM

## 2021-06-12 ENCOUNTER — Encounter: Payer: Self-pay | Admitting: Internal Medicine

## 2021-06-12 NOTE — Patient Instructions (Addendum)
° ° °  Your flu and pneumonia vaccines were given today.    Blood work was ordered.      Medications changes include :   none    Please followup in 6 months

## 2021-06-12 NOTE — Progress Notes (Signed)
Subjective:    Patient ID: [name], female    DOB: 1955-10-18, 65 y.o.   MRN: [id number]  This visit occurred during the SARS-CoV-2 public health emergency.  Safety protocols were in place, including screening questions prior to the visit, additional usage of staff PPE, and extensive cleaning of exam room while observing appropriate contact time as indicated for disinfecting solutions.     HPI The patient is here for follow up of their chronic medical problems, including htn, DM, hld, OSA, severe, obesity  She is not exercising regularly.   She is not doing much - usually stays home and is on the computer a lot.  She does not feel like doing much.  Medications and allergies reviewed with patient and updated if appropriate.  Patient Active Problem List   Diagnosis Date Noted   Class 3 obesity with alveolar hypoventilation, serious comorbidity, and body mass index (BMI) of 45.0 to 49.9 in adult ([place]) 06/24/2019   Sleep apnea, severe 06/24/2019   Decreased hearing 06/08/2019   Cough 06/08/2019   Stiffness of left shoulder joint 08/15/2018   Left rotator cuff tear 07/02/2018   Pain in joint of right shoulder 11/05/2017   Right rotator cuff tear 10/18/2017   Complete rotator cuff tear 10/18/2017   Chronic pain of both shoulders 04/17/2017   Bilateral primary osteoarthritis of knee 04/11/2016   Alopecia areata 09/24/2013   Vitamin D deficiency 10/01/2012   Diabetes mellitus without complication ([place]) 62/08/5595   Hyperlipidemia 02/26/2008   Essential hypertension 02/26/2008    Current Outpatient Medications on File Prior to Visit  Medication Sig Dispense Refill   amLODipine (NORVASC) 5 MG tablet Take 1 tablet by mouth once daily 90 tablet 0   aspirin 81 MG tablet Take 81 mg by mouth daily.     Biotin 5000 MCG CAPS Take 1 capsule by mouth daily.      Cholecalciferol (VITAMIN D-3) 1000 UNITS CAPS Take 1 capsule by mouth daily.     hydrochlorothiazide (HYDRODIURIL) 12.5  MG tablet Take 1 tablet by mouth once daily 90 tablet 0   loratadine (CLARITIN) 10 MG tablet Take 10 mg by mouth daily.     metoprolol succinate (TOPROL-XL) 100 MG 24 hr tablet TAKE 1 TABLET BY MOUTH ONCE DAILY WITH MEALS 90 tablet 0   rosuvastatin (CRESTOR) 5 MG tablet Take 1 tablet (5 mg total) by mouth daily. 90 tablet 3   terbinafine (LAMISIL) 250 MG tablet Take 1 tablet (250 mg total) by mouth daily. 30 tablet 0   No current facility-administered medications on file prior to visit.    Past Medical History:  Diagnosis Date   Alopecia    Diabetes mellitus without complication ([place])    Herpes zoster 1992   posterior thorax   Hyperlipidemia 2006    LDL 104, HDL 50   Hypertension    OA (osteoarthritis) of knee    Bilateral   Other abnormal glucose    Ovarian cyst    Small   Pre-diabetes    Uterine fibroid    Vitamin D deficiency     Past Surgical History:  Procedure Laterality Date   COLONOSCOPY  2007, 07/25/2016   negative   CYSTECTOMY     peri rectal   FOOT SURGERY     bilaterally   INCONTINENCE SURGERY  2005   Dr Quincy Simmonds   LIPOMA EXCISION  2011    L chest , Dr Georgette Dover   SHOULDER ARTHROSCOPY WITH ROTATOR CUFF REPAIR  AND SUBACROMIAL DECOMPRESSION Right 10/18/2017   Procedure: Right shoulder arthroscopy, subacromial decompression, labrial debridement mini open rotator cuff repair;  Surgeon: Susa Day, MD;  Location: [facility];  Service: Orthopedics;  Laterality: Right;  120 mins   SHOULDER ARTHROSCOPY WITH ROTATOR CUFF REPAIR AND SUBACROMIAL DECOMPRESSION Left 07/02/2018   Procedure: Left shoulder arthroscopy, subacromial decompression, mini open rotator cuff repair,;  Surgeon: Susa Day, MD;  Location: [facility];  Service: Orthopedics;  Laterality: Left;  90 mins    Social History   Socioeconomic History   Marital status: Single    Spouse name: Not on file   Number of children: Not on file   Years of education: Not on file   Highest education level: Not on file   Occupational History   Not on file  Tobacco Use   Smoking status: Former    Years: 6.00    Types: Cigarettes    Quit date: 06/25/1980    Years since quitting: 40.9   Smokeless tobacco: Never   Tobacco comments:    smoked 16-25 , up to 1/2 ppd  Vaping Use   Vaping Use: Never used  Substance and Sexual Activity   Alcohol use: Not Currently   Drug use: No   Sexual activity: Yes    Birth control/protection: Post-menopausal  Other Topics Concern   Not on file  Social History Narrative   No regular exercise   Social Determinants of Health   Financial Resource Strain: Not on file  Food Insecurity: Not on file  Transportation Needs: Not on file  Physical Activity: Not on file  Stress: Not on file  Social Connections: Not on file    Family History  Problem Relation Age of Onset   Diabetes Mother    Stroke Father 68   Hypertension Father    Hypertension Sister    Hypertension Maternal Aunt    Diabetes Maternal Aunt    Cancer Neg Hx    Colon cancer Neg Hx    Breast cancer Neg Hx     Review of Systems  Constitutional:  Negative for chills and fever.  Respiratory:  Negative for cough, shortness of breath and wheezing.   Cardiovascular:  Negative for chest pain, palpitations and leg swelling.  Neurological:  Positive for light-headedness (occ). Negative for headaches.      Objective:   Vitals:   06/13/21 1533  BP: 120/82  Pulse: 67  Temp: 98.1 F (36.7 C)  SpO2: 97%   BP Readings from Last 3 Encounters:  06/13/21 120/82  11/30/20 128/78  06/01/20 (!) 144/76   Wt Readings from Last 3 Encounters:  06/13/21 291 lb (132 kg)  11/30/20 284 lb (128.8 kg)  06/01/20 277 lb (125.6 kg)   Body mass index is 48.42 kg/m.  Depression screen [facility] 2/9 06/13/2021 02/04/2020 12/16/2018 04/17/2017 09/14/2015  Decreased Interest 0 0 0 0 0  Down, Depressed, Hopeless 0 0 0 0 0  PHQ - 2 Score 0 0 0 0 0  Altered sleeping 0 - - - -  Tired, decreased energy 0 - - - -  Change in  appetite 0 - - - -  Feeling bad or failure about yourself  0 - - - -  Trouble concentrating 0 - - - -  Moving slowly or fidgety/restless 0 - - - -  Suicidal thoughts 0 - - - -  PHQ-9 Score 0 - - - -    GAD 7 : Generalized Anxiety Score 06/13/2021  Nervous, Anxious, on Edge  0  Control/stop worrying 0  Worry too much - different things 0  Trouble relaxing 0  Restless 0  Easily annoyed or irritable 0  Afraid - awful might happen 0  Total GAD 7 Score 0        Physical Exam    Constitutional: Appears well-developed and well-nourished. No distress.  HENT:  Head: Normocephalic and atraumatic.  Neck: Neck supple. No tracheal deviation present. No thyromegaly present.  No cervical lymphadenopathy Cardiovascular: Normal rate, regular rhythm and normal heart sounds.   No murmur heard. No carotid bruit .  No edema Pulmonary/Chest: Effort normal and breath sounds normal. No respiratory distress. No has no wheezes. No rales.  Skin: Skin is warm and dry. Not diaphoretic.  Psychiatric: Normal mood and affect. Behavior is normal.      Assessment & Plan:    Screened for depression using the PHQ 9 scale.  No evidence of depression.   Screened for anxiety using GAD7 Scale.  No evidence of anxiety.     See Problem List for Assessment and Plan of chronic medical problems.

## 2021-06-13 ENCOUNTER — Ambulatory Visit (INDEPENDENT_AMBULATORY_CARE_PROVIDER_SITE_OTHER): Payer: Medicare Other | Admitting: Internal Medicine

## 2021-06-13 VITALS — BP 120/82 | HR 67 | Temp 98.1°F | Ht 65.0 in | Wt 291.0 lb

## 2021-06-13 DIAGNOSIS — E559 Vitamin D deficiency, unspecified: Secondary | ICD-10-CM | POA: Diagnosis not present

## 2021-06-13 DIAGNOSIS — I10 Essential (primary) hypertension: Secondary | ICD-10-CM

## 2021-06-13 DIAGNOSIS — E119 Type 2 diabetes mellitus without complications: Secondary | ICD-10-CM

## 2021-06-13 DIAGNOSIS — Z23 Encounter for immunization: Secondary | ICD-10-CM

## 2021-06-13 DIAGNOSIS — E662 Morbid (severe) obesity with alveolar hypoventilation: Secondary | ICD-10-CM

## 2021-06-13 DIAGNOSIS — Z1331 Encounter for screening for depression: Secondary | ICD-10-CM

## 2021-06-13 DIAGNOSIS — Z6841 Body Mass Index (BMI) 40.0 and over, adult: Secondary | ICD-10-CM

## 2021-06-13 DIAGNOSIS — G4730 Sleep apnea, unspecified: Secondary | ICD-10-CM | POA: Diagnosis not present

## 2021-06-13 DIAGNOSIS — E782 Mixed hyperlipidemia: Secondary | ICD-10-CM | POA: Diagnosis not present

## 2021-06-13 LAB — CBC WITH DIFFERENTIAL/PLATELET
Basophils Absolute: 0.1 10*3/uL (ref 0.0–0.1)
Basophils Relative: 0.9 % (ref 0.0–3.0)
Eosinophils Absolute: 0.4 10*3/uL (ref 0.0–0.7)
Eosinophils Relative: 4.4 % (ref 0.0–5.0)
HCT: 42.9 % (ref 36.0–46.0)
Hemoglobin: 13.7 g/dL (ref 12.0–15.0)
Lymphocytes Relative: 24.4 % (ref 12.0–46.0)
Lymphs Abs: 2 10*3/uL (ref 0.7–4.0)
MCHC: 32.1 g/dL (ref 30.0–36.0)
MCV: 90.6 fl (ref 78.0–100.0)
Monocytes Absolute: 1 10*3/uL (ref 0.1–1.0)
Monocytes Relative: 11.7 % (ref 3.0–12.0)
Neutro Abs: 4.9 10*3/uL (ref 1.4–7.7)
Neutrophils Relative %: 58.6 % (ref 43.0–77.0)
Platelets: 220 10*3/uL (ref 150.0–400.0)
RBC: 4.73 Mil/uL (ref 3.87–5.11)
RDW: 14.2 % (ref 11.5–15.5)
WBC: 8.3 10*3/uL (ref 4.0–10.5)

## 2021-06-13 LAB — MICROALBUMIN / CREATININE URINE RATIO
Creatinine,U: 186.4 mg/dL
Microalb Creat Ratio: 0.4 mg/g (ref 0.0–30.0)
Microalb, Ur: 0.7 mg/dL (ref 0.0–1.9)

## 2021-06-13 LAB — COMPREHENSIVE METABOLIC PANEL
ALT: 10 U/L (ref 0–35)
AST: 15 U/L (ref 0–37)
Albumin: 3.9 g/dL (ref 3.5–5.2)
Alkaline Phosphatase: 79 U/L (ref 39–117)
BUN: 18 mg/dL (ref 6–23)
CO2: 30 mEq/L (ref 19–32)
Calcium: 9.6 mg/dL (ref 8.4–10.5)
Chloride: 102 mEq/L (ref 96–112)
Creatinine, Ser: 1.16 mg/dL (ref 0.40–1.20)
GFR: 49.58 mL/min — ABNORMAL LOW (ref >60.00)
Glucose, Bld: 82 mg/dL (ref 70–99)
Potassium: 3.9 mEq/L (ref 3.5–5.1)
Sodium: 138 mEq/L (ref 135–145)
Total Bilirubin: 0.3 mg/dL (ref 0.2–1.2)
Total Protein: 7.6 g/dL (ref 6.0–8.3)

## 2021-06-13 LAB — LIPID PANEL
Cholesterol: 120 mg/dL (ref 0–200)
HDL: 45.6 mg/dL (ref >39.00)
LDL Cholesterol: 59 mg/dL (ref 0–99)
NonHDL: 73.94
Total CHOL/HDL Ratio: 3
Triglycerides: 76 mg/dL (ref 0.0–149.0)
VLDL: 15.2 mg/dL (ref 0.0–40.0)

## 2021-06-13 LAB — VITAMIN D 25 HYDROXY (VIT D DEFICIENCY, FRACTURES): VITD: 40.34 ng/mL (ref 30.00–100.00)

## 2021-06-13 LAB — HEMOGLOBIN A1C: Hgb A1c MFr Bld: 6.9 % — ABNORMAL HIGH (ref 4.6–6.5)

## 2021-06-13 NOTE — Assessment & Plan Note (Signed)
Chronic Check lipid panel  Continue crestor 5 mg daily Regular exercise and healthy diet encouraged  

## 2021-06-13 NOTE — Assessment & Plan Note (Signed)
Chronic ?Check vitamin d level ?

## 2021-06-13 NOTE — Assessment & Plan Note (Signed)
Chronic BP well controlled Continue 5 mg daily, hctz 12.5 mg daily, metoprolol xl 100 mg daily cmp

## 2021-06-13 NOTE — Assessment & Plan Note (Signed)
Chronic Using cpap nightly Stressed importance of compliance with nightly use

## 2021-06-13 NOTE — Assessment & Plan Note (Signed)
Chronic Check a1c,urine microalbumin Lab Results  Component Value Date   HGBA1C 6.8 (H) 11/30/2020   Diet controlled Discussed medication she can take that may also help with weight loss

## 2021-06-13 NOTE — Assessment & Plan Note (Signed)
Chronic Stressed weight loss Advised exercise 30 min x 5 days a week Decrease portions, healthy diet

## 2021-06-13 NOTE — Addendum Note (Signed)
Addended by: Marcina Millard on: 06/13/2021 05:08 PM   Modules accepted: Orders

## 2021-07-19 ENCOUNTER — Encounter: Payer: Self-pay | Admitting: Adult Health

## 2021-07-19 ENCOUNTER — Ambulatory Visit: Payer: Medicare Other | Admitting: Adult Health

## 2021-07-19 VITALS — BP 125/79 | HR 49 | Ht 65.0 in | Wt 292.2 lb

## 2021-07-19 DIAGNOSIS — Z9989 Dependence on other enabling machines and devices: Secondary | ICD-10-CM | POA: Diagnosis not present

## 2021-07-19 DIAGNOSIS — G4733 Obstructive sleep apnea (adult) (pediatric): Secondary | ICD-10-CM | POA: Diagnosis not present

## 2021-07-19 NOTE — Progress Notes (Signed)
PATIENT: Shari [name] DOB: 08-09-1955  REASON FOR VISIT: follow up HISTORY FROM: patient  HISTORY OF PRESENT ILLNESS: Today 07/19/21:  Shari [name] is a 66 year old female with a history of obstructive sleep apnea on CPAP.  She returns today for follow-up.  She reports that she does not like using the CPAP but understands the risk associated with untreated sleep apnea.  She did inquire about inspire however her BMI is elevated.  She is trying to work on weight loss.  She returns today for evaluation.   04/06/20: Shari [name] is a 66 year old female with a history of obstructive sleep apnea on CPAP.  Her download indicates that she used her machine 29 out of 30 days for compliance of 97%.  She used her machine greater than 4 hours 28 days for compliance of 93%.  On average she uses her machine 7 hours and 3 minutes.  Her residual AHI is 1.5 on 6 to 18 cm of water with EPR 2.  Leak in the 95th percentile is 8.6.  The patient states that she has tried to remain faithful to using her CPAP.  Although she has not been able to tell a big difference.  She states that she never really had symptoms before being diagnosed.  She states that she is diabetic with hemoglobin A1c at 6.8.  She states that she has been trying to fix her diet.  Currently she only eats 1 big meal a [name].  She returns today for an evaluation.    REVIEW OF SYSTEMS: Out of a complete 14 system review of symptoms, the patient complains only of the following symptoms, and all other reviewed systems are negative.  ESS 8  ALLERGIES: Allergies  Allergen Reactions   Shari [name]     REACTION: angioedema of lip. She cannot take angiotensin receptor blockers because of this history.    HOME MEDICATIONS: Outpatient Medications Prior to Visit  Medication Sig Dispense Refill   amLODipine (NORVASC) 5 MG tablet Take 1 tablet by mouth once daily 90 tablet 0   aspirin 81 MG tablet Take 81 mg by mouth daily.     Biotin 5000 MCG CAPS  Take 1 capsule by mouth daily.      Cholecalciferol (VITAMIN D-3) 1000 UNITS CAPS Take 1 capsule by mouth daily.     hydrochlorothiazide (HYDRODIURIL) 12.5 MG tablet Take 1 tablet by mouth once daily 90 tablet 0   loratadine (CLARITIN) 10 MG tablet Take 10 mg by mouth daily.     metoprolol succinate (TOPROL-XL) 100 MG 24 hr tablet TAKE 1 TABLET BY MOUTH ONCE DAILY WITH MEALS 90 tablet 0   rosuvastatin (CRESTOR) 5 MG tablet Take 1 tablet (5 mg total) by mouth daily. 90 tablet 3   terbinafine (LAMISIL) 250 MG tablet Take 1 tablet (250 mg total) by mouth daily. 30 tablet 0   [name] facility-administered medications prior to visit.    PAST MEDICAL HISTORY: Past Medical History:  Diagnosis Date   Alopecia    Diabetes mellitus without complication ([place])    Herpes zoster 1992   posterior thorax   Hyperlipidemia 2006    LDL 104, HDL 50   Hypertension    OA (osteoarthritis) of knee    Bilateral   Other abnormal glucose    Ovarian cyst    Small   Pre-diabetes    Uterine fibroid    Vitamin D deficiency     PAST SURGICAL HISTORY: Past Surgical History:  Procedure Laterality Date   COLONOSCOPY  2007, 07/25/2016   negative   CYSTECTOMY     peri rectal   FOOT SURGERY     bilaterally   INCONTINENCE SURGERY  2005   Dr Shari [name]   LIPOMA EXCISION  2011    L chest , Dr Shari [name]   SHOULDER ARTHROSCOPY WITH ROTATOR CUFF REPAIR AND SUBACROMIAL DECOMPRESSION Right 10/18/2017   Procedure: Right shoulder arthroscopy, subacromial decompression, labrial debridement mini open rotator cuff repair;  Surgeon: Shari Day, MD;  Location: [facility];  Service: Orthopedics;  Laterality: Right;  120 mins   SHOULDER ARTHROSCOPY WITH ROTATOR CUFF REPAIR AND SUBACROMIAL DECOMPRESSION Left 07/02/2018   Procedure: Left shoulder arthroscopy, subacromial decompression, mini open rotator cuff repair,;  Surgeon: Shari Day, MD;  Location: [facility];  Service: Orthopedics;  Laterality: Left;  90 mins    FAMILY  HISTORY: Family History  Problem Relation Age of Onset   Diabetes Mother    Stroke Father 97   Hypertension Father    Hypertension Sister    Hypertension Maternal Aunt    Diabetes Maternal Aunt    Cancer Neg Hx    Colon cancer Neg Hx    Breast cancer Neg Hx     SOCIAL HISTORY: Social History   Socioeconomic History   Marital status: Single    Spouse name: Not on file   Number of children: Not on file   Years of education: Not on file   Highest education level: Not on file  Occupational History   Not on file  Tobacco Use   Smoking status: Former    Years: 6.00    Types: Cigarettes    Quit date: 06/25/1980    Years since quitting: 41.0   Smokeless tobacco: Never   Tobacco comments:    smoked 16-25 , up to 1/2 ppd  Vaping Use   Vaping Use: Never used  Substance and Sexual Activity   Alcohol use: Not Currently   Drug use: [name]   Sexual activity: Yes    Birth control/protection: Post-menopausal  Other Topics Concern   Not on file  Social History Narrative   [name] regular exercise   Social Determinants of Health   Financial Resource Strain: Not on file  Food Insecurity: Not on file  Transportation Needs: Not on file  Physical Activity: Not on file  Stress: Not on file  Social Connections: Not on file  Intimate Partner Violence: Not on file      PHYSICAL EXAM  Vitals:   07/19/21 0926  BP: 125/79  Pulse: (!) 49  Weight: 292 lb 3.2 oz (132.5 kg)  Height: 5\' 5"  (1.651 m)   Body mass index is 48.62 kg/m.  Generalized: Well developed, in [name] acute distress  Chest: Lungs clear to auscultation bilaterally  Neurological examination  Mentation: Alert oriented to time, place, history taking. Follows all commands speech and language fluent Cranial nerve II-XII: Extraocular movements were full, visual field were full on confrontational test Head turning and shoulder shrug  were normal and symmetric. Motor: The motor testing reveals 5 over 5 strength of all 4  extremities. Good symmetric motor tone is noted throughout.  Gait and station: Gait is normal.    DIAGNOSTIC DATA (LABS, IMAGING, TESTING) - I reviewed patient records, labs, notes, testing and imaging myself where available.  Lab Results  Component Value Date   WBC 8.3 06/13/2021   HGB 13.7 06/13/2021   HCT 42.9 06/13/2021   MCV 90.6 06/13/2021   PLT 220.0 06/13/2021      Component  Value Date/Time   NA 138 06/13/2021 1624   K 3.9 06/13/2021 1624   CL 102 06/13/2021 1624   CO2 30 06/13/2021 1624   GLUCOSE 82 06/13/2021 1624   BUN 18 06/13/2021 1624   CREATININE 1.16 06/13/2021 1624   CREATININE 1.12 (H) 08/11/2020 1548   CALCIUM 9.6 06/13/2021 1624   PROT 7.6 06/13/2021 1624   ALBUMIN 3.9 06/13/2021 1624   AST 15 06/13/2021 1624   ALT 10 06/13/2021 1624   ALKPHOS 79 06/13/2021 1624   BILITOT 0.3 06/13/2021 1624   GFRNONAA 60 (L) 06/23/2018 1531   GFRAA >60 06/23/2018 1531   Lab Results  Component Value Date   CHOL 120 06/13/2021   HDL 45.60 06/13/2021   LDLCALC 59 06/13/2021   TRIG 76.0 06/13/2021   CHOLHDL 3 06/13/2021   Lab Results  Component Value Date   HGBA1C 6.9 (H) 06/13/2021   [name] results found for: VITAMINB12 Lab Results  Component Value Date   TSH 2.49 12/16/2018      ASSESSMENT AND PLAN 66 y.o. year old female  has a past medical history of Alopecia, Diabetes mellitus without complication ([place]), Herpes zoster (1992), Hyperlipidemia (2006), Hypertension, OA (osteoarthritis) of knee, Other abnormal glucose, Ovarian cyst, Pre-diabetes, Uterine fibroid, and Vitamin D deficiency. here with:  OSA on CPAP  - CPAP compliance excellent - Good treatment of AHI  - Encourage patient to use CPAP nightly and > 4 hours each night - F/U in 1 year or sooner if needed    Ward Givens, MSN, NP-C 07/19/2021, 9:49 AM [facility] [address], [place], [place] [address] [phone]

## 2021-07-19 NOTE — Patient Instructions (Signed)
Continue using CPAP nightly and greater than 4 hours each night °If your symptoms worsen or you develop new symptoms please let us know.  ° °

## 2021-08-21 ENCOUNTER — Other Ambulatory Visit: Payer: Self-pay | Admitting: Internal Medicine

## 2021-08-31 ENCOUNTER — Ambulatory Visit: Payer: Medicare Other | Admitting: Podiatry

## 2021-08-31 ENCOUNTER — Encounter: Payer: Self-pay | Admitting: Podiatry

## 2021-08-31 ENCOUNTER — Other Ambulatory Visit: Payer: Self-pay

## 2021-08-31 DIAGNOSIS — L603 Nail dystrophy: Secondary | ICD-10-CM

## 2021-08-31 NOTE — Progress Notes (Signed)
She presents today for follow-up of her Lamisil therapy for her onychomycosis states that I think they are looking great to me. ? ?Objective: Vital signs are stable alert and oriented x3.  Pulses are palpable.  Toenails are 100% clear at this point. ? ?Assessment: 100% clear onychomycosis. ? ?Plan: She will watch his nails closely determine if there is any recurrence if there is she will call us immediately. ?

## 2021-09-01 DIAGNOSIS — G4733 Obstructive sleep apnea (adult) (pediatric): Secondary | ICD-10-CM | POA: Diagnosis not present

## 2021-09-06 ENCOUNTER — Other Ambulatory Visit: Payer: Self-pay | Admitting: Internal Medicine

## 2021-09-06 DIAGNOSIS — I10 Essential (primary) hypertension: Secondary | ICD-10-CM

## 2021-09-22 ENCOUNTER — Other Ambulatory Visit: Payer: Self-pay | Admitting: Internal Medicine

## 2021-09-27 LAB — HM PAP SMEAR: HM Pap smear: NORMAL

## 2021-12-06 ENCOUNTER — Other Ambulatory Visit: Payer: Self-pay | Admitting: Internal Medicine

## 2021-12-06 DIAGNOSIS — I10 Essential (primary) hypertension: Secondary | ICD-10-CM

## 2021-12-11 ENCOUNTER — Encounter: Payer: Self-pay | Admitting: Internal Medicine

## 2021-12-11 NOTE — Patient Instructions (Addendum)
Blood work was ordered.     Medications changes include :   none    Return in about 6 months (around 06/13/2022) for follow up.   Health Maintenance, Female Adopting a healthy lifestyle and getting preventive care are important in promoting health and wellness. Ask your health care provider about: The right schedule for you to have regular tests and exams. Things you can do on your own to prevent diseases and keep yourself healthy. What should I know about diet, weight, and exercise? Eat a healthy diet  Eat a diet that includes plenty of vegetables, fruits, low-fat dairy products, and lean protein. Do not eat a lot of foods that are high in solid fats, added sugars, or sodium. Maintain a healthy weight Body mass index (BMI) is used to identify weight problems. It estimates body fat based on height and weight. Your health care provider can help determine your BMI and help you achieve or maintain a healthy weight. Get regular exercise Get regular exercise. This is one of the most important things you can do for your health. Most adults should: Exercise for at least 150 minutes each week. The exercise should increase your heart rate and make you sweat (moderate-intensity exercise). Do strengthening exercises at least twice a week. This is in addition to the moderate-intensity exercise. Spend less time sitting. Even light physical activity can be beneficial. Watch cholesterol and blood lipids Have your blood tested for lipids and cholesterol at 66 years of age, then have this test every 5 years. Have your cholesterol levels checked more often if: Your lipid or cholesterol levels are high. You are older than 66 years of age. You are at high risk for heart disease. What should I know about cancer screening? Depending on your health history and family history, you may need to have cancer screening at various ages. This may include screening for: Breast cancer. Cervical  cancer. Colorectal cancer. Skin cancer. Lung cancer. What should I know about heart disease, diabetes, and high blood pressure? Blood pressure and heart disease High blood pressure causes heart disease and increases the risk of stroke. This is more likely to develop in people who have high blood pressure readings or are overweight. Have your blood pressure checked: Every 3-5 years if you are 37-33 years of age. Every year if you are 48 years old or older. Diabetes Have regular diabetes screenings. This checks your fasting blood sugar level. Have the screening done: Once every three years after age 20 if you are at a normal weight and have a low risk for diabetes. More often and at a younger age if you are overweight or have a high risk for diabetes. What should I know about preventing infection? Hepatitis B If you have a higher risk for hepatitis B, you should be screened for this virus. Talk with your health care provider to find out if you are at risk for hepatitis B infection. Hepatitis C Testing is recommended for: Everyone born from [birth year] through 1965. Anyone with known risk factors for hepatitis C. Sexually transmitted infections (STIs) Get screened for STIs, including gonorrhea and chlamydia, if: You are sexually active and are younger than 66 years of age. You are older than 66 years of age and your health care provider tells you that you are at risk for this type of infection. Your sexual activity has changed since you were last screened, and you are at increased risk for chlamydia or gonorrhea. Ask your health care  provider if you are at risk. Ask your health care provider about whether you are at high risk for HIV. Your health care provider may recommend a prescription medicine to help prevent HIV infection. If you choose to take medicine to prevent HIV, you should first get tested for HIV. You should then be tested every 3 months for as long as you are taking the  medicine. Pregnancy If you are about to stop having your period (premenopausal) and you may become pregnant, seek counseling before you get pregnant. Take 400 to 800 micrograms (mcg) of folic acid every day if you become pregnant. Ask for birth control (contraception) if you want to prevent pregnancy. Osteoporosis and menopause Osteoporosis is a disease in which the bones lose minerals and strength with aging. This can result in bone fractures. If you are 37 years old or older, or if you are at risk for osteoporosis and fractures, ask your health care provider if you should: Be screened for bone loss. Take a calcium or vitamin D supplement to lower your risk of fractures. Be given hormone replacement therapy (HRT) to treat symptoms of menopause. Follow these instructions at home: Alcohol use Do not drink alcohol if: Your health care provider tells you not to drink. You are pregnant, may be pregnant, or are planning to become pregnant. If you drink alcohol: Limit how much you have to: 0-1 drink a day. Know how much alcohol is in your drink. In the [place] one drink equals one 12 oz bottle of beer (355 mL), one 5 oz glass of wine (148 mL), or one 1 oz glass of hard liquor (44 mL). Lifestyle Do not use any products that contain nicotine or tobacco. These products include cigarettes, chewing tobacco, and vaping devices, such as e-cigarettes. If you need help quitting, ask your health care provider. Do not use street drugs. Do not share needles. Ask your health care provider for help if you need support or information about quitting drugs. General instructions Schedule regular health, dental, and eye exams. Stay current with your vaccines. Tell your health care provider if: You often feel depressed. You have ever been abused or do not feel safe at home. Summary Adopting a healthy lifestyle and getting preventive care are important in promoting health and wellness. Follow your health care  provider's instructions about healthy diet, exercising, and getting tested or screened for diseases. Follow your health care provider's instructions on monitoring your cholesterol and blood pressure. This information is not intended to replace advice given to you by your health care provider. Make sure you discuss any questions you have with your health care provider. Document Revised: 10/31/2020 Document Reviewed: 10/31/2020 Elsevier Patient Education  [employer].

## 2021-12-11 NOTE — Progress Notes (Unsigned)
Subjective:    Patient ID: [name], female    DOB: 05/01/1956, 65 y.o.   MRN: [id number]      HPI [name] is here for a Physical exam.   Doing well.  No concerns.     Medications and allergies reviewed with patient and updated if appropriate.  Current Outpatient Medications on File Prior to Visit  Medication Sig Dispense Refill   amLODipine (NORVASC) 5 MG tablet Take 1 tablet by mouth once daily 90 tablet 0   aspirin 81 MG tablet Take 81 mg by mouth daily.     Biotin 5000 MCG CAPS Take 1 capsule by mouth daily.      Cholecalciferol (VITAMIN D-3) 1000 UNITS CAPS Take 1 capsule by mouth daily.     hydrochlorothiazide (HYDRODIURIL) 12.5 MG tablet Take 1 tablet by mouth once daily 90 tablet 1   loratadine (CLARITIN) 10 MG tablet Take 10 mg by mouth daily.     metoprolol succinate (TOPROL-XL) 100 MG 24 hr tablet TAKE 1 TABLET BY MOUTH ONCE DAILY WITH MEALS 90 tablet 0   rosuvastatin (CRESTOR) 5 MG tablet Take 1 tablet by mouth once daily 90 tablet 0   No current facility-administered medications on file prior to visit.    Review of Systems  Constitutional:  Negative for fever.  Eyes:  Negative for visual disturbance.  Respiratory:  Negative for cough, shortness of breath and wheezing.   Cardiovascular:  Negative for chest pain, palpitations and leg swelling.  Gastrointestinal:  Positive for diarrhea (with certain foods). Negative for abdominal pain, blood in stool, constipation and nausea.       No gerd  Genitourinary:  Negative for dysuria.  Musculoskeletal:  Negative for arthralgias and back pain.  Skin:  Negative for rash.  Neurological:  Negative for light-headedness and headaches.  Psychiatric/Behavioral:  Negative for dysphoric mood. The patient is not nervous/anxious.        Objective:   Vitals:   12/12/21 1409  BP: 128/76  Pulse: (!) 54  Temp: 98.6 F (37 C)  SpO2: 95%   Filed Weights   12/12/21 1409  Weight: 294 lb (133.4 kg)   Body mass index  is 48.92 kg/m.  BP Readings from Last 3 Encounters:  12/12/21 128/76  07/19/21 125/79  06/13/21 120/82    Wt Readings from Last 3 Encounters:  12/12/21 294 lb (133.4 kg)  07/19/21 292 lb 3.2 oz (132.5 kg)  06/13/21 291 lb (132 kg)       Physical Exam Constitutional: [name] appears well-developed and well-nourished. No distress.  HENT:  Head: Normocephalic and atraumatic.  Right Ear: External ear normal. Normal ear canal and TM Left Ear: External ear normal.  Normal ear canal and TM Mouth/Throat: Oropharynx is clear and moist.  Eyes: Conjunctivae normal.  Neck: Neck supple. No tracheal deviation present. No thyromegaly present.  No carotid bruit  Cardiovascular: Normal rate, regular rhythm and normal heart sounds.   No murmur heard.  No edema. Pulmonary/Chest: Effort normal and breath sounds normal. No respiratory distress. [name] has no wheezes. [name] has no rales.  Breast: deferred   Abdominal: Soft. [name] exhibits no distension. There is no tenderness.  Lymphadenopathy: [name] has no cervical adenopathy.  Skin: Skin is warm and dry. [name] is not diaphoretic.  Psychiatric: [name] has a normal mood and affect. Her behavior is normal.     Lab Results  Component Value Date   WBC 8.3 06/13/2021   HGB 13.7 06/13/2021   HCT 42.9 06/13/2021  PLT 220.0 06/13/2021   GLUCOSE 82 06/13/2021   CHOL 120 06/13/2021   TRIG 76.0 06/13/2021   HDL 45.60 06/13/2021   LDLCALC 59 06/13/2021   ALT 10 06/13/2021   AST 15 06/13/2021   NA 138 06/13/2021   K 3.9 06/13/2021   CL 102 06/13/2021   CREATININE 1.16 06/13/2021   BUN 18 06/13/2021   CO2 30 06/13/2021   TSH 2.49 12/16/2018   HGBA1C 6.9 (H) 06/13/2021   MICROALBUR 0.7 06/13/2021         Assessment & Plan:   Physical exam: Screening blood work  ordered Exercise  not regular - off and on Weight  encouraged weight loss Substance abuse  none   Reviewed recommended immunizations.   Health Maintenance  Topic Date Due   PAP  SMEAR-Modifier  08/29/2021   HEMOGLOBIN A1C  12/12/2021   OPHTHALMOLOGY EXAM  12/12/2021 (Originally 03/31/1966)   FOOT EXAM  12/13/2021 (Originally 06/14/2021)   COVID-19 Vaccine (3 - Pfizer risk series) 12/28/2021 (Originally 10/14/2019)   Zoster Vaccines- Shingrix (1 of 2) 03/14/2022 (Originally 04/01/1975)   DEXA SCAN  12/13/2022 (Originally 03/31/2021)   INFLUENZA VACCINE  01/23/2022   URINE MICROALBUMIN  06/13/2022   MAMMOGRAM  03/10/2023   COLONOSCOPY (Pts 45-59yr Insurance coverage will need to be confirmed)  07/25/2026   TETANUS/TDAP  12/15/2028   Pneumonia Vaccine 66 Years old  Completed   Hepatitis C Screening  Completed   HIV Screening  Completed   HPV VACCINES  Aged Out          See Problem List for Assessment and Plan of chronic medical problems.

## 2021-12-12 ENCOUNTER — Ambulatory Visit (INDEPENDENT_AMBULATORY_CARE_PROVIDER_SITE_OTHER): Payer: Medicare Other | Admitting: Internal Medicine

## 2021-12-12 VITALS — BP 128/76 | HR 54 | Temp 98.6°F | Ht 65.0 in | Wt 294.0 lb

## 2021-12-12 DIAGNOSIS — E782 Mixed hyperlipidemia: Secondary | ICD-10-CM | POA: Diagnosis not present

## 2021-12-12 DIAGNOSIS — G4730 Sleep apnea, unspecified: Secondary | ICD-10-CM

## 2021-12-12 DIAGNOSIS — E559 Vitamin D deficiency, unspecified: Secondary | ICD-10-CM

## 2021-12-12 DIAGNOSIS — Z13820 Encounter for screening for osteoporosis: Secondary | ICD-10-CM | POA: Diagnosis not present

## 2021-12-12 DIAGNOSIS — Z6841 Body Mass Index (BMI) 40.0 and over, adult: Secondary | ICD-10-CM

## 2021-12-12 DIAGNOSIS — E119 Type 2 diabetes mellitus without complications: Secondary | ICD-10-CM

## 2021-12-12 DIAGNOSIS — E662 Morbid (severe) obesity with alveolar hypoventilation: Secondary | ICD-10-CM

## 2021-12-12 DIAGNOSIS — Z0000 Encounter for general adult medical examination without abnormal findings: Secondary | ICD-10-CM

## 2021-12-12 DIAGNOSIS — I10 Essential (primary) hypertension: Secondary | ICD-10-CM

## 2021-12-12 LAB — VITAMIN D 25 HYDROXY (VIT D DEFICIENCY, FRACTURES): VITD: 54.47 ng/mL (ref 30.00–100.00)

## 2021-12-12 LAB — CBC WITH DIFFERENTIAL/PLATELET
Basophils Absolute: 0 10*3/uL (ref 0.0–0.1)
Basophils Relative: 0.4 % (ref 0.0–3.0)
Eosinophils Absolute: 0.3 10*3/uL (ref 0.0–0.7)
Eosinophils Relative: 4 % (ref 0.0–5.0)
HCT: 41.7 % (ref 36.0–46.0)
Hemoglobin: 13.4 g/dL (ref 12.0–15.0)
Lymphocytes Relative: 25.3 % (ref 12.0–46.0)
Lymphs Abs: 2.1 10*3/uL (ref 0.7–4.0)
MCHC: 32.2 g/dL (ref 30.0–36.0)
MCV: 91.2 fl (ref 78.0–100.0)
Monocytes Absolute: 0.8 10*3/uL (ref 0.1–1.0)
Monocytes Relative: 9.4 % (ref 3.0–12.0)
Neutro Abs: 5.2 10*3/uL (ref 1.4–7.7)
Neutrophils Relative %: 60.9 % (ref 43.0–77.0)
Platelets: 225 10*3/uL (ref 150.0–400.0)
RBC: 4.57 Mil/uL (ref 3.87–5.11)
RDW: 14.2 % (ref 11.5–15.5)
WBC: 8.5 10*3/uL (ref 4.0–10.5)

## 2021-12-12 LAB — LIPID PANEL
Cholesterol: 115 mg/dL (ref 0–200)
HDL: 41.4 mg/dL (ref >39.00)
LDL Cholesterol: 56 mg/dL (ref 0–99)
NonHDL: 73.68
Total CHOL/HDL Ratio: 3
Triglycerides: 86 mg/dL (ref 0.0–149.0)
VLDL: 17.2 mg/dL (ref 0.0–40.0)

## 2021-12-12 LAB — COMPREHENSIVE METABOLIC PANEL
ALT: 11 U/L (ref 0–35)
AST: 16 U/L (ref 0–37)
Albumin: 3.8 g/dL (ref 3.5–5.2)
Alkaline Phosphatase: 80 U/L (ref 39–117)
BUN: 17 mg/dL (ref 6–23)
CO2: 31 mEq/L (ref 19–32)
Calcium: 9.8 mg/dL (ref 8.4–10.5)
Chloride: 102 mEq/L (ref 96–112)
Creatinine, Ser: 1.1 mg/dL (ref 0.40–1.20)
GFR: 52.66 mL/min — ABNORMAL LOW (ref >60.00)
Glucose, Bld: 111 mg/dL — ABNORMAL HIGH (ref 70–99)
Potassium: 3.9 mEq/L (ref 3.5–5.1)
Sodium: 139 mEq/L (ref 135–145)
Total Bilirubin: 0.3 mg/dL (ref 0.2–1.2)
Total Protein: 7.3 g/dL (ref 6.0–8.3)

## 2021-12-12 LAB — HEMOGLOBIN A1C: Hgb A1c MFr Bld: 6.9 % — ABNORMAL HIGH (ref 4.6–6.5)

## 2021-12-12 NOTE — Assessment & Plan Note (Signed)
Chronic Check level 

## 2021-12-12 NOTE — Assessment & Plan Note (Signed)
Chronic Uses cpap nightly 

## 2021-12-12 NOTE — Assessment & Plan Note (Addendum)
Chronic  Lab Results  Component Value Date   HGBA1C 6.9 (H) 06/13/2021   Sugars  controlled, but have increased over the years and there is concern he may need medication soon Ideally she would like to avoid medication Check A1c Continue diet control for now unless A1c is above 7 Stressed regular exercise, diabetic diet and weight loss Deferred CCM referral for care management nurse

## 2021-12-12 NOTE — Assessment & Plan Note (Signed)
Chronic Check lipid panel  Continue crestor 5 mg QOD Regular exercise and healthy diet encouraged

## 2021-12-12 NOTE — Assessment & Plan Note (Addendum)
Chronic Encouraged weight loss Encouraged regular exercise Advised decreased portions, low sugar / carb diet Advised lean protein / veges - less carbs/sugars

## 2021-12-12 NOTE — Assessment & Plan Note (Signed)
Chronic BP well controlled Continue norvasc 5 mg daily, metoprolol xl 100 mg daily, hctz  12.5 mg daily cmp

## 2021-12-19 ENCOUNTER — Other Ambulatory Visit: Payer: Self-pay | Admitting: Internal Medicine

## 2021-12-19 DIAGNOSIS — I10 Essential (primary) hypertension: Secondary | ICD-10-CM

## 2021-12-19 MED ORDER — HYDROCHLOROTHIAZIDE 12.5 MG PO TABS
12.5000 mg | ORAL_TABLET | Freq: Every day | ORAL | 1 refills | Status: DC
Start: 2021-12-19 — End: 2022-03-06

## 2021-12-19 MED ORDER — AMLODIPINE BESYLATE 5 MG PO TABS: 5.0000 mg | ORAL_TABLET | Freq: Every day | ORAL | 1 refills | Status: DC

## 2021-12-19 MED ORDER — METOPROLOL SUCCINATE ER 100 MG PO TB24: 100.0000 mg | ORAL_TABLET | Freq: Every day | ORAL | 1 refills | Status: DC

## 2021-12-19 MED ORDER — ROSUVASTATIN CALCIUM 5 MG PO TABS
5.0000 mg | ORAL_TABLET | Freq: Every day | ORAL | 1 refills | Status: DC
Start: 2021-12-19 — End: 2022-01-29

## 2021-12-22 ENCOUNTER — Other Ambulatory Visit: Payer: Self-pay | Admitting: Internal Medicine

## 2021-12-22 DIAGNOSIS — I10 Essential (primary) hypertension: Secondary | ICD-10-CM

## 2022-01-08 ENCOUNTER — Encounter: Payer: Self-pay | Admitting: Internal Medicine

## 2022-01-08 NOTE — Progress Notes (Unsigned)
    Subjective:    Patient ID: [name], female    DOB: 1956/01/09, 66 y.o.   MRN: [id number]      HPI Santos is here for No chief complaint on file.        Medications and allergies reviewed with patient and updated if appropriate.  Current Outpatient Medications on File Prior to Visit  Medication Sig Dispense Refill   amLODipine (NORVASC) 5 MG tablet Take 1 tablet (5 mg total) by mouth daily. 90 tablet 1   aspirin 81 MG tablet Take 81 mg by mouth daily.     Biotin 5000 MCG CAPS Take 1 capsule by mouth daily.      Cholecalciferol (VITAMIN D-3) 1000 UNITS CAPS Take 1 capsule by mouth daily.     hydrochlorothiazide (HYDRODIURIL) 12.5 MG tablet Take 1 tablet (12.5 mg total) by mouth daily. 90 tablet 1   loratadine (CLARITIN) 10 MG tablet Take 10 mg by mouth daily.     metoprolol succinate (TOPROL-XL) 100 MG 24 hr tablet Take 1 tablet (100 mg total) by mouth daily. Take with or immediately following a meal. 90 tablet 1   rosuvastatin (CRESTOR) 5 MG tablet Take 1 tablet (5 mg total) by mouth daily. 90 tablet 1   No current facility-administered medications on file prior to visit.    Review of Systems     Objective:  There were no vitals filed for this visit. BP Readings from Last 3 Encounters:  12/12/21 128/76  07/19/21 125/79  06/13/21 120/82   Wt Readings from Last 3 Encounters:  12/12/21 294 lb (133.4 kg)  07/19/21 292 lb 3.2 oz (132.5 kg)  06/13/21 291 lb (132 kg)   There is no height or weight on file to calculate BMI.    Physical Exam         Assessment & Plan:    See Problem List for Assessment and Plan of chronic medical problems.

## 2022-01-09 ENCOUNTER — Ambulatory Visit (INDEPENDENT_AMBULATORY_CARE_PROVIDER_SITE_OTHER): Payer: Medicare Other | Admitting: Internal Medicine

## 2022-01-09 DIAGNOSIS — J45991 Cough variant asthma: Secondary | ICD-10-CM | POA: Diagnosis not present

## 2022-01-09 MED ORDER — LEVOCETIRIZINE DIHYDROCHLORIDE 5 MG PO TABS: 5.0000 mg | ORAL_TABLET | Freq: Every evening | ORAL | 11 refills | Status: DC

## 2022-01-09 MED ORDER — SPACER/AERO-HOLDING CHAMBERS DEVI: 0 refills | Status: DC

## 2022-01-09 MED ORDER — ALBUTEROL SULFATE HFA 108 (90 BASE) MCG/ACT IN AERS: 2.0000 | INHALATION_SPRAY | Freq: Four times a day (QID) | RESPIRATORY_TRACT | 0 refills | Status: DC | PRN

## 2022-01-09 MED ORDER — PREDNISONE 20 MG PO TABS: 40.0000 mg | ORAL_TABLET | Freq: Every day | ORAL | 0 refills | Status: AC

## 2022-01-09 NOTE — Assessment & Plan Note (Signed)
Acute on chronic Likely flare of asthma is likely the cause of her cough No evidence of infection Stop Claritin Start xyzal 5 mg evenings Prednisone 40 mg daily with breakfast x 5 days Albuterol inhaler Q 6 hrs prn Call if no improvement

## 2022-01-09 NOTE — Patient Instructions (Addendum)
    Medications changes include :   stop claritin (loratadine).   Start prednisone 40 mg daily with breakfast Start xyzal at night ( allergies) Albuterol inhaler - use as needed   Your prescription(s) have been sent to your pharmacy.     Return if symptoms worsen or fail to improve.

## 2022-01-25 ENCOUNTER — Other Ambulatory Visit: Payer: Self-pay | Admitting: Internal Medicine

## 2022-01-25 DIAGNOSIS — Z1231 Encounter for screening mammogram for malignant neoplasm of breast: Secondary | ICD-10-CM

## 2022-01-28 ENCOUNTER — Other Ambulatory Visit: Payer: Self-pay | Admitting: Internal Medicine

## 2022-02-08 ENCOUNTER — Ambulatory Visit
Admission: RE | Admit: 2022-02-08 | Discharge: 2022-02-08 | Disposition: A | Discharge status: Home / Self Care | Payer: Medicare Other | Source: Ambulatory Visit | Attending: Internal Medicine | Admitting: Internal Medicine

## 2022-02-08 DIAGNOSIS — M8588 Other specified disorders of bone density and structure, other site: Secondary | ICD-10-CM | POA: Diagnosis not present

## 2022-02-08 DIAGNOSIS — Z13820 Encounter for screening for osteoporosis: Secondary | ICD-10-CM

## 2022-02-08 DIAGNOSIS — Z780 Asymptomatic menopausal state: Secondary | ICD-10-CM | POA: Diagnosis not present

## 2022-02-09 LAB — HM DEXA SCAN

## 2022-02-12 ENCOUNTER — Encounter: Payer: Self-pay | Admitting: Internal Medicine

## 2022-02-12 DIAGNOSIS — M8580 Other specified disorders of bone density and structure, unspecified site: Secondary | ICD-10-CM | POA: Insufficient documentation

## 2022-02-20 ENCOUNTER — Encounter: Payer: Self-pay | Admitting: Internal Medicine

## 2022-02-20 NOTE — Progress Notes (Signed)
Outside notes received. Information abstracted. Notes sent to scan.  

## 2022-02-28 ENCOUNTER — Ambulatory Visit: Payer: Medicare Other

## 2022-02-28 VITALS — BP 120/70 | HR 51 | Temp 97.2°F | Ht 65.0 in

## 2022-02-28 DIAGNOSIS — Z0000 Encounter for general adult medical examination without abnormal findings: Secondary | ICD-10-CM

## 2022-02-28 NOTE — Progress Notes (Signed)
Subjective:   Suhaylah Wampole is a 66 y.o. female who presents for an Initial Medicare Annual Wellness Visit.  Review of Systems     Cardiac Risk Factors include: advanced age (>59mn, >>81women);diabetes mellitus;dyslipidemia;family history of premature cardiovascular disease;hypertension;obesity (BMI >30kg/m2)     Objective:    Today's Vitals   02/28/22 1609  BP: 120/70  Pulse: (!) 51  Temp: (!) 97.2 F (36.2 C)  SpO2: 98%  Height: '5\' 5"'$  (1.651 m)  PainSc: 0-No pain   Body mass index is 48.26 kg/m.     02/28/2022    4:15 PM 02/04/2020    4:14 PM 07/02/2018   12:30 PM 06/23/2018    3:06 PM 10/18/2017    4:24 PM 10/18/2017   11:11 AM 10/17/2017    2:09 PM  Advanced Directives  Does Patient Have a Medical Advance Directive? No No No No No No No  Would patient like information on creating a medical advance directive? No - Patient declined No - Patient declined No - Patient declined No - Patient declined No - Patient declined No - Patient declined     Current Medications (verified) Outpatient Encounter Medications as of 02/28/2022  Medication Sig   albuterol (VENTOLIN HFA) 108 (90 Base) MCG/ACT inhaler Inhale 2 puffs into the lungs every 6 (six) hours as needed for wheezing or shortness of breath.   amLODipine (NORVASC) 5 MG tablet Take 1 tablet (5 mg total) by mouth daily.   aspirin 81 MG tablet Take 81 mg by mouth daily.   Biotin 5000 MCG CAPS Take 1 capsule by mouth daily.    Cholecalciferol (VITAMIN D-3) 1000 UNITS CAPS Take 1 capsule by mouth daily.   hydrochlorothiazide (HYDRODIURIL) 12.5 MG tablet Take 1 tablet (12.5 mg total) by mouth daily.   levocetirizine (XYZAL) 5 MG tablet Take 1 tablet (5 mg total) by mouth every evening.   metoprolol succinate (TOPROL-XL) 100 MG 24 hr tablet Take 1 tablet (100 mg total) by mouth daily. Take with or immediately following a meal.   rosuvastatin (CRESTOR) 5 MG tablet TAKE 1 TABLET BY MOUTH DAILY   Spacer/Aero-Holding Chambers DEVI  UAD for inhaler   No facility-administered encounter medications on file as of 02/28/2022.    Allergies (verified) Benazepril hcl   History: Past Medical History:  Diagnosis Date   Alopecia    Diabetes mellitus without complication ([place]    Herpes zoster 1992   posterior thorax   Hyperlipidemia 2006    LDL 104, HDL 50   Hypertension    OA (osteoarthritis) of knee    Bilateral   Other abnormal glucose    Ovarian cyst    Small   Pre-diabetes    Uterine fibroid    Vitamin D deficiency    Past Surgical History:  Procedure Laterality Date   COLONOSCOPY  2007, 07/25/2016   negative   CYSTECTOMY     peri rectal   FOOT SURGERY     bilaterally   INCONTINENCE SURGERY  2005   Dr SQuincy Simmonds  LIPOMA EXCISION  2011    L chest , Dr TGeorgette Dover  SHOULDER ARTHROSCOPY WITH ROTATOR CUFF REPAIR AND SUBACROMIAL DECOMPRESSION Right 10/18/2017   Procedure: Right shoulder arthroscopy, subacromial decompression, labrial debridement mini open rotator cuff repair;  Surgeon: BSusa Day MD;  Location: [facility];  Service: Orthopedics;  Laterality: Right;  120 mins   SHOULDER ARTHROSCOPY WITH ROTATOR CUFF REPAIR AND SUBACROMIAL DECOMPRESSION Left 07/02/2018   Procedure: Left shoulder arthroscopy, subacromial decompression,  mini open rotator cuff repair,;  Surgeon: Susa Day, MD;  Location: [facility];  Service: Orthopedics;  Laterality: Left;  90 mins   Family History  Problem Relation Age of Onset   Diabetes Mother    Stroke Father 62   Hypertension Father    Hypertension Sister    Hypertension Maternal Aunt    Diabetes Maternal Aunt    Cancer Neg Hx    Colon cancer Neg Hx    Breast cancer Neg Hx    Social History   Socioeconomic History   Marital status: Single    Spouse name: Not on file   Number of children: Not on file   Years of education: Not on file   Highest education level: Not on file  Occupational History   Not on file  Tobacco Use   Smoking status: Former    Years: 6.00     Types: Cigarettes    Quit date: 06/25/1980    Years since quitting: 41.7   Smokeless tobacco: Never   Tobacco comments:    smoked [age] , up to 1/2 ppd  Vaping Use   Vaping Use: Never used  Substance and Sexual Activity   Alcohol use: Not Currently   Drug use: No   Sexual activity: Yes    Birth control/protection: Post-menopausal  Other Topics Concern   Not on file  Social History Narrative   No regular exercise   Social Determinants of Health   Financial Resource Strain: Low Risk  (02/28/2022)   Overall Financial Resource Strain (CARDIA)    Difficulty of Paying Living Expenses: Not hard at all  Food Insecurity: No Food Insecurity (02/28/2022)   Hunger Vital Sign    Worried About Running Out of Food in the Last Year: Never true    [place] in the Last Year: Never true  Transportation Needs: No Transportation Needs (02/28/2022)   PRAPARE - Hydrologist (Medical): No    Lack of Transportation (Non-Medical): No  Physical Activity: Sufficiently Active (02/28/2022)   Exercise Vital Sign    Days of Exercise per Week: 4 days    Minutes of Exercise per Session: 60 min  Stress: No Stress Concern Present (02/28/2022)   [place]    Feeling of Stress : Not at all  Social Connections: Socially Integrated (02/28/2022)   Social Connection and Isolation Panel [NHANES]    Frequency of Communication with Friends and Family: More than three times a week    Frequency of Social Gatherings with Friends and Family: More than three times a week    Attends Religious Services: More than 4 times per year    Active Member of [employer] or Organizations: Yes    Attends Music therapist: More than 4 times per year    Marital Status: Married    Tobacco Counseling Counseling given: Not Answered Tobacco comments: smoked [age] , up to 1/2 ppd   Clinical Intake:  Pre-visit preparation completed:  Yes  Pain : No/denies pain Pain Score: 0-No pain     BMI - recorded: 48.26 (01/09/2022) Nutritional Status: BMI > 30  Obese Nutritional Risks: None Diabetes: Yes CBG done?: No Did pt. bring in CBG monitor from home?: No  How often do you need to have someone help you when you read instructions, pamphlets, or other written materials from your doctor or pharmacy?: 1 - Never What is the last grade level you completed in  school?: HSG  Diabetic? yes  Interpreter Needed?: No  Information entered by :: Lisette Abu, LPN.   Activities of Daily Living    02/28/2022    4:41 PM 06/13/2021    3:38 PM  In your present state of health, do you have any difficulty performing the following activities:  Hearing? 0 0  Vision? 0 0  Difficulty concentrating or making decisions? 0 0  Walking or climbing stairs? 0 0  Dressing or bathing? 0 0  Doing errands, shopping? 0 0  Preparing Food and eating ? N   Using the Toilet? N   In the past six months, have you accidently leaked urine? N   Do you have problems with loss of bowel control? N   Managing your Medications? N   Managing your Finances? N   Housekeeping or managing your Housekeeping? N     Patient Care Team: Binnie Rail, MD as PCP - General (Internal Medicine)  Indicate any recent Medical Services you may have received from other than [facility] providers in the past year (date may be approximate).     Assessment:   This is a routine wellness examination for Emalie.  Hearing/Vision screen Hearing Screening - Comments:: Denies hearing difficulties   Vision Screening - Comments:: Wears rx glasses - up to date with routine eye exams with [facility].   Dietary issues and exercise activities discussed: Current Exercise Habits: Home exercise routine;Structured exercise class, Type of exercise: walking;treadmill;stretching;strength training/weights;exercise ball;calisthenics, Time (Minutes): 60, Frequency (Times/Week): 4, Weekly  Exercise (Minutes/Week): 240, Intensity: Moderate, Exercise limited by: respiratory conditions(s)   Goals Addressed             This Visit's Progress    My goal is to continue to be physically active, lose weight and keep my sanity.        Depression Screen    02/28/2022    4:40 PM 01/09/2022    4:12 PM 12/12/2021    2:16 PM 06/13/2021    3:56 PM 02/04/2020    4:14 PM 12/16/2018    1:29 PM 04/17/2017    3:12 PM  PHQ 2/9 Scores  PHQ - 2 Score 0 0 0 0 0 0 0  PHQ- 9 Score 0 0 0 0       Fall Risk    02/28/2022    4:41 PM 12/12/2021    2:16 PM 06/13/2021    3:34 PM 02/04/2020    4:13 PM 12/16/2018    1:29 PM  [place] in the past year? 0 0 0 0 0  Number falls in past yr: 0  0  0  Injury with Fall? 0  0    Risk for fall due to : No Fall Risks  No Fall Risks    Follow up Falls prevention discussed  Falls evaluation completed      FALL RISK PREVENTION PERTAINING TO THE HOME:  Any stairs in or around the home? Yes  If so, are there any without handrails? No  Home free of loose throw rugs in walkways, pet beds, electrical cords, etc? Yes  Adequate lighting in your home to reduce risk of falls? Yes   ASSISTIVE DEVICES UTILIZED TO PREVENT FALLS:  Life alert? No  Use of a cane, walker or w/c? No  Grab bars in the bathroom? No  Shower chair or bench in shower? No  Elevated toilet seat or a handicapped toilet? Yes   TIMED UP AND GO:  Was the test  performed? Yes .  Length of time to ambulate 10 feet: 6 sec.   Gait steady and fast without use of assistive device  Cognitive Function:        02/28/2022    4:42 PM  6CIT Screen  What Year? 0 points  What month? 0 points  What time? 0 points  Count back from 20 0 points  Months in reverse 0 points  Repeat phrase 0 points  Total Score 0 points    Immunizations Immunization History  Administered Date(s) Administered   Fluad Quad(high Dose 65+) 06/13/2021   Influenza Whole 03/24/2009   Influenza,inj,Quad PF,6+  Mos 06/08/2019, 06/01/2020   PFIZER(Purple Top)SARS-COV-2 Vaccination 08/23/2019, 09/16/2019   PNEUMOCOCCAL CONJUGATE-20 06/13/2021   Td 02/26/2008   Tdap 12/16/2018    TDAP status: Up to date  Flu Vaccine status: Due, Education has been provided regarding the importance of this vaccine. Advised may receive this vaccine at local pharmacy or Health Dept. Aware to provide a copy of the vaccination record if obtained from local pharmacy or Health Dept. Verbalized acceptance and understanding.  Pneumococcal vaccine status: Up to date  Covid-19 vaccine status: Completed vaccines  Qualifies for Shingles Vaccine? Yes   Zostavax completed No   Shingrix Completed?: No.    Education has been provided regarding the importance of this vaccine. Patient has been advised to call insurance company to determine out of pocket expense if they have not yet received this vaccine. Advised may also receive vaccine at local pharmacy or Health Dept. Verbalized acceptance and understanding.  Screening Tests Health Maintenance  Topic Date Due   OPHTHALMOLOGY EXAM  Never done   COVID-19 Vaccine (3 - Pfizer risk series) 10/14/2019   FOOT EXAM  06/14/2021   PAP SMEAR-Modifier  08/29/2021   INFLUENZA VACCINE  01/23/2022   Zoster Vaccines- Shingrix (1 of 2) 03/14/2022 (Originally 04/01/1975)   Diabetic kidney evaluation - Urine ACR  06/13/2022   HEMOGLOBIN A1C  06/13/2022   Diabetic kidney evaluation - GFR measurement  12/13/2022   MAMMOGRAM  03/10/2023   DEXA SCAN  02/09/2025   COLONOSCOPY (Pts 45-66yr Insurance coverage will need to be confirmed)  07/25/2026   TETANUS/TDAP  12/15/2028   Pneumonia Vaccine 66 Years old  Completed   Hepatitis C Screening  Completed   HIV Screening  Completed   HPV VACCINES  Aged Out    Health Maintenance  Health Maintenance Due  Topic Date Due   OPHTHALMOLOGY EXAM  Never done   COVID-19 Vaccine (3 - Pfizer risk series) 10/14/2019   FOOT EXAM  06/14/2021   PAP  SMEAR-Modifier  08/29/2021   INFLUENZA VACCINE  01/23/2022    Colorectal cancer screening: Type of screening: Colonoscopy. Completed 07/25/2016. Repeat every 10 years  Mammogram status: Completed 03/09/2021. Repeat every year (Scheduled for 03/12/2022)  Bone Density status: Completed 02/09/2022. Results reflect: Bone density results: OSTEOPENIA. Repeat every 3 years.  Lung Cancer Screening: (Low Dose CT Chest recommended if Age 66-80years, 30 pack-year currently smoking OR have quit w/in 15years.) does not qualify.   Lung Cancer Screening Referral: no  Additional Screening:  Hepatitis C Screening: does qualify; Completed 04/11/2016  Vision Screening: Recommended annual ophthalmology exams for early detection of glaucoma and other disorders of the eye. Is the patient up to date with their annual eye exam?  Yes  Who is the provider or what is the name of the office in which the patient attends annual eye exams? [facility] If pt is not established with a  provider, would they like to be referred to a provider to establish care? No .   Dental Screening: Recommended annual dental exams for proper oral hygiene  Community Resource Referral / Chronic Care Management: CRR required this visit?  No   CCM required this visit?  No      Plan:     I have personally reviewed and noted the following in the patient's chart:   Medical and social history Use of alcohol, tobacco or illicit drugs  Current medications and supplements including opioid prescriptions. Patient is not currently taking opioid prescriptions. Functional ability and status Nutritional status Physical activity Advanced directives List of other physicians Hospitalizations, surgeries, and ER visits in previous 12 months Vitals Screenings to include cognitive, depression, and falls Referrals and appointments  In addition, I have reviewed and discussed with patient certain preventive protocols, quality metrics, and best  practice recommendations. A written personalized care plan for preventive services as well as general preventive health recommendations were provided to patient.     Sheral Flow, LPN   01/24/5002   Nurse Notes:  Hearing Screening - Comments:: Denies hearing difficulties   Vision Screening - Comments:: Wears rx glasses - up to date with routine eye exams with [facility].

## 2022-02-28 NOTE — Patient Instructions (Addendum)
[name] , Thank you for taking time to come for your Medicare Wellness Visit. I appreciate your ongoing commitment to your health goals. Please review the following plan we discussed and let me know if I can assist you in the future.   Screening recommendations/referrals: Colonoscopy: 1/318/2018; due every 10 years Mammogram: 03/09/2021; due every year (scheduled for 03/12/2022) Bone Density: 02/09/2022; due every 3 years Recommended yearly ophthalmology/optometry visit for glaucoma screening and checkup Recommended yearly dental visit for hygiene and checkup  Vaccinations: Influenza vaccine: 06/13/2021; due every Fall Season 66 Pneumococcal vaccine: 06/13/2021 Tdap vaccine: 03/18/2019; due every 10 years Shingles vaccine: never done   Covid-19: 08/23/2019, 09/16/2019  Advanced directives: No  Conditions/risks identified: Yes  Next appointment: Follow up in one year for your annual wellness visit on 03/04/2023 at 1:45 pm. In the office with Nurse Mignon Pine.   Preventive Care 58 Years and Older, Female Preventive care refers to lifestyle choices and visits with your health care provider that can promote health and wellness. What does preventive care include? A yearly physical exam. This is also called an annual well check. Dental exams once or twice a year. Routine eye exams. Ask your health care provider how often you should have your eyes checked. Personal lifestyle choices, including: Daily care of your teeth and gums. Regular physical activity. Eating a healthy diet. Avoiding tobacco and drug use. Limiting alcohol use. Practicing safe sex. Taking low-dose aspirin every day. Taking vitamin and mineral supplements as recommended by your health care provider. What happens during an annual well check? The services and screenings done by your health care provider during your annual well check will depend on your age, overall health, lifestyle risk factors, and family history of  disease. Counseling  Your health care provider may ask you questions about your: Alcohol use. Tobacco use. Drug use. Emotional well-being. Home and relationship well-being. Sexual activity. Eating habits. History of falls. Memory and ability to understand (cognition). Work and work Statistician. Reproductive health. Screening  You may have the following tests or measurements: Height, weight, and BMI. Blood pressure. Lipid and cholesterol levels. These may be checked every 5 years, or more frequently if you are over 70 years old. Skin check. Lung cancer screening. You may have this screening every year starting at age 35 if you have a 30-pack-year history of smoking and currently smoke or have quit within the past 15 years. Fecal occult blood test (FOBT) of the stool. You may have this test every year starting at age 40. Flexible sigmoidoscopy or colonoscopy. You may have a sigmoidoscopy every 5 years or a colonoscopy every 10 years starting at age 33. Hepatitis C blood test. Hepatitis B blood test. Sexually transmitted disease (STD) testing. Diabetes screening. This is done by checking your blood sugar (glucose) after you have not eaten for a while (fasting). You may have this done every 1-3 years. Bone density scan. This is done to screen for osteoporosis. You may have this done starting at age 19. Mammogram. This may be done every 1-2 years. Talk to your health care provider about how often you should have regular mammograms. Talk with your health care provider about your test results, treatment options, and if necessary, the need for more tests. Vaccines  Your health care provider may recommend certain vaccines, such as: Influenza vaccine. This is recommended every year. Tetanus, diphtheria, and acellular pertussis (Tdap, Td) vaccine. You may need a Td booster every 10 years. Zoster vaccine. You may need this after age  66. Pneumococcal 13-valent conjugate (PCV13) vaccine. One  dose is recommended after age 12. Pneumococcal polysaccharide (PPSV23) vaccine. One dose is recommended after age 80. Talk to your health care provider about which screenings and vaccines you need and how often you need them. This information is not intended to replace advice given to you by your health care provider. Make sure you discuss any questions you have with your health care provider. Document Released: 07/08/2015 Document Revised: 02/29/2016 Document Reviewed: 04/12/2015 Elsevier Interactive Patient Education  2017 [employer] Prevention in the Home Falls can cause injuries. They can happen to people of all ages. There are many things you can do to make your home safe and to help prevent falls. What can I do on the outside of my home? Regularly fix the edges of walkways and driveways and fix any cracks. Remove anything that might make you trip as you walk through a door, such as a raised step or threshold. Trim any bushes or trees on the path to your home. Use bright outdoor lighting. Clear any walking paths of anything that might make someone trip, such as rocks or tools. Regularly check to see if handrails are loose or broken. Make sure that both sides of any steps have handrails. Any raised decks and porches should have guardrails on the edges. Have any leaves, snow, or ice cleared regularly. Use sand or salt on walking paths during winter. Clean up any spills in your garage right away. This includes oil or grease spills. What can I do in the bathroom? Use night lights. Install grab bars by the toilet and in the tub and shower. Do not use towel bars as grab bars. Use non-skid mats or decals in the tub or shower. If you need to sit down in the shower, use a plastic, non-slip stool. Keep the floor dry. Clean up any water that spills on the floor as soon as it happens. Remove soap buildup in the tub or shower regularly. Attach bath mats securely with double-sided  non-slip rug tape. Do not have throw rugs and other things on the floor that can make you trip. What can I do in the bedroom? Use night lights. Make sure that you have a light by your bed that is easy to reach. Do not use any sheets or blankets that are too big for your bed. They should not hang down onto the floor. Have a firm chair that has side arms. You can use this for support while you get dressed. Do not have throw rugs and other things on the floor that can make you trip. What can I do in the kitchen? Clean up any spills right away. Avoid walking on wet floors. Keep items that you use a lot in easy-to-reach places. If you need to reach something above you, use a strong step stool that has a grab bar. Keep electrical cords out of the way. Do not use floor polish or wax that makes floors slippery. If you must use wax, use non-skid floor wax. Do not have throw rugs and other things on the floor that can make you trip. What can I do with my stairs? Do not leave any items on the stairs. Make sure that there are handrails on both sides of the stairs and use them. Fix handrails that are broken or loose. Make sure that handrails are as long as the stairways. Check any carpeting to make sure that it is firmly attached to the stairs. Fix  any carpet that is loose or worn. Avoid having throw rugs at the top or bottom of the stairs. If you do have throw rugs, attach them to the floor with carpet tape. Make sure that you have a light switch at the top of the stairs and the bottom of the stairs. If you do not have them, ask someone to add them for you. What else can I do to help prevent falls? Wear shoes that: Do not have high heels. Have rubber bottoms. Are comfortable and fit you well. Are closed at the toe. Do not wear sandals. If you use a stepladder: Make sure that it is fully opened. Do not climb a closed stepladder. Make sure that both sides of the stepladder are locked into place. Ask  someone to hold it for you, if possible. Clearly mark and make sure that you can see: Any grab bars or handrails. First and last steps. Where the edge of each step is. Use tools that help you move around (mobility aids) if they are needed. These include: Canes. Walkers. Scooters. Crutches. Turn on the lights when you go into a dark area. Replace any light bulbs as soon as they burn out. Set up your furniture so you have a clear path. Avoid moving your furniture around. If any of your floors are uneven, fix them. If there are any pets around you, be aware of where they are. Review your medicines with your doctor. Some medicines can make you feel dizzy. This can increase your chance of falling. Ask your doctor what other things that you can do to help prevent falls. This information is not intended to replace advice given to you by your health care provider. Make sure you discuss any questions you have with your health care provider. Document Released: 04/07/2009 Document Revised: 11/17/2015 Document Reviewed: 07/16/2014 Elsevier Interactive Patient Education  2017 [employer].

## 2022-03-02 ENCOUNTER — Telehealth: Payer: Self-pay | Admitting: Internal Medicine

## 2022-03-02 NOTE — Telephone Encounter (Signed)
Patient needs a Welcome to medicare visit with Dr. Quay Burow prior to September 30. Attempted to call patient, no answer and voicemail is not set up. Sent MyChart message requesting patient either call the office or respond via MyChart to schedule.

## 2022-03-05 ENCOUNTER — Encounter: Payer: Self-pay | Admitting: Internal Medicine

## 2022-03-05 ENCOUNTER — Other Ambulatory Visit: Payer: Self-pay | Admitting: Internal Medicine

## 2022-03-05 NOTE — Progress Notes (Signed)
Outside notes received. Information abstracted. Notes sent to scan.  

## 2022-03-12 ENCOUNTER — Ambulatory Visit
Admission: RE | Admit: 2022-03-12 | Discharge: 2022-03-12 | Disposition: A | Discharge status: Home / Self Care | Payer: Medicare Other | Source: Ambulatory Visit | Attending: Internal Medicine | Admitting: Internal Medicine

## 2022-03-12 DIAGNOSIS — Z1231 Encounter for screening mammogram for malignant neoplasm of breast: Secondary | ICD-10-CM | POA: Diagnosis not present

## 2022-04-03 ENCOUNTER — Other Ambulatory Visit: Payer: Self-pay | Admitting: Internal Medicine

## 2022-04-03 DIAGNOSIS — I10 Essential (primary) hypertension: Secondary | ICD-10-CM

## 2022-05-21 DIAGNOSIS — G4733 Obstructive sleep apnea (adult) (pediatric): Secondary | ICD-10-CM | POA: Diagnosis not present

## 2022-06-07 ENCOUNTER — Ambulatory Visit
Admission: EM | Admit: 2022-06-07 | Discharge: 2022-06-07 | Disposition: A | Discharge status: Home / Self Care | Payer: Medicare Other | Attending: Family Medicine | Admitting: Family Medicine

## 2022-06-07 ENCOUNTER — Ambulatory Visit (INDEPENDENT_AMBULATORY_CARE_PROVIDER_SITE_OTHER): Payer: Medicare Other

## 2022-06-07 DIAGNOSIS — R0789 Other chest pain: Secondary | ICD-10-CM | POA: Diagnosis not present

## 2022-06-07 DIAGNOSIS — S20219A Contusion of unspecified front wall of thorax, initial encounter: Secondary | ICD-10-CM | POA: Diagnosis not present

## 2022-06-07 MED ORDER — NAPROXEN 375 MG PO TABS: ORAL_TABLET | ORAL | 0 refills | Status: DC

## 2022-06-07 NOTE — Discharge Instructions (Signed)
Take Naprosyn for pain Avoid movement and activity that hurts See your doctor if not improving by next week

## 2022-06-07 NOTE — ED Provider Notes (Signed)
[facility]    CSN: [id number] Arrival date & time: 06/07/22  1252      History   Chief Complaint Chief Complaint  Patient presents with   Chest Injury    Sternum and ribs    HPI [name] is a 66 y.o. female.   HPI  Patient states that she was exercising at a gym.  She was walking around a track around and exercise space.  As she was walking she tripped over her feet and fell forward.  Landed flat on her chest.  Has bruises on her breast.  Has pain with deep breath and with movement of trunk.  She fell on Monday, it is now Thursday and she is still experiencing pain.  Past Medical History:  Diagnosis Date   Alopecia    Diabetes mellitus without complication ([place])    Herpes zoster 1992   posterior thorax   Hyperlipidemia 2006    LDL 104, HDL 50   Hypertension    OA (osteoarthritis) of knee    Bilateral   Other abnormal glucose    Ovarian cyst    Small   Pre-diabetes    Uterine fibroid    Vitamin D deficiency     Patient Active Problem List   Diagnosis Date Noted   Osteopenia 02/12/2022   Asthma, cough variant 01/09/2022   Class 3 obesity with alveolar hypoventilation, serious comorbidity, and body mass index (BMI) of 45.0 to 49.9 in adult ([place]) 06/24/2019   Sleep apnea, severe 06/24/2019   Decreased hearing 06/08/2019   Stiffness of left shoulder joint 08/15/2018   Left rotator cuff tear 07/02/2018   Pain in joint of right shoulder 11/05/2017   Right rotator cuff tear 10/18/2017   Chronic pain of both shoulders 04/17/2017   Bilateral primary osteoarthritis of knee 04/11/2016   Alopecia areata 09/24/2013   Vitamin D deficiency 10/01/2012   Diabetes mellitus without complication ([place]) 03/54/6568   Hyperlipidemia 02/26/2008   Essential hypertension 02/26/2008    Past Surgical History:  Procedure Laterality Date   COLONOSCOPY  2007, 07/25/2016   negative   CYSTECTOMY     peri rectal   FOOT SURGERY     bilaterally   INCONTINENCE  SURGERY  2005   Dr Quincy Simmonds   LIPOMA EXCISION  2011    L chest , Dr Georgette Dover   SHOULDER ARTHROSCOPY WITH ROTATOR CUFF REPAIR AND SUBACROMIAL DECOMPRESSION Right 10/18/2017   Procedure: Right shoulder arthroscopy, subacromial decompression, labrial debridement mini open rotator cuff repair;  Surgeon: Susa Day, MD;  Location: [facility];  Service: Orthopedics;  Laterality: Right;  120 mins   SHOULDER ARTHROSCOPY WITH ROTATOR CUFF REPAIR AND SUBACROMIAL DECOMPRESSION Left 07/02/2018   Procedure: Left shoulder arthroscopy, subacromial decompression, mini open rotator cuff repair,;  Surgeon: Susa Day, MD;  Location: [facility];  Service: Orthopedics;  Laterality: Left;  90 mins    OB History   No obstetric history on file.      Home Medications    Prior to Admission medications   Medication Sig Start Date End Date Taking? Authorizing Provider  naproxen (NAPROSYN) 375 MG tablet Take 1 tablet 2-3 times a day with food.  This will help with pain and inflammation 06/07/22  Yes Raylene Everts, MD  albuterol (VENTOLIN HFA) 108 (90 Base) MCG/ACT inhaler Inhale 2 puffs into the lungs every 6 (six) hours as needed for wheezing or shortness of breath. 01/09/22   Binnie Rail, MD  amLODipine (NORVASC) 5 MG tablet TAKE  1 TABLET BY MOUTH DAILY 04/04/22   Binnie Rail, MD  aspirin 81 MG tablet Take 81 mg by mouth daily.    [provider]  Biotin 5000 MCG CAPS Take 1 capsule by mouth daily.     [provider]  Cholecalciferol (VITAMIN D-3) 1000 UNITS CAPS Take 1 capsule by mouth daily.    [provider]  hydrochlorothiazide (HYDRODIURIL) 12.5 MG tablet TAKE 1 TABLET BY MOUTH DAILY 03/06/22   Binnie Rail, MD  levocetirizine (XYZAL) 5 MG tablet Take 1 tablet (5 mg total) by mouth every evening. 01/09/22   Binnie Rail, MD  metoprolol succinate (TOPROL-XL) 100 MG 24 hr tablet TAKE 1 TABLET BY MOUTH DAILY  WITH OR IMMEDIATELY FOLLOWING A  MEAL 04/04/22   Burns, Claudina Lick, MD   rosuvastatin (CRESTOR) 5 MG tablet TAKE 1 TABLET BY MOUTH DAILY 01/29/22   Binnie Rail, MD  Spacer/Aero-Holding Josiah Lobo DEVI UAD for inhaler 01/09/22   Binnie Rail, MD    Family History Family History  Problem Relation Age of Onset   Diabetes Mother    Stroke Father 42   Hypertension Father    Hypertension Sister    Hypertension Maternal Aunt    Diabetes Maternal Aunt    Cancer Neg Hx    Colon cancer Neg Hx    Breast cancer Neg Hx     Social History Social History   Tobacco Use   Smoking status: Former    Years: 6.00    Types: Cigarettes    Quit date: 06/25/1980    Years since quitting: 41.9   Smokeless tobacco: Never   Tobacco comments:    smoked 16-25 , up to 1/2 ppd  Vaping Use   Vaping Use: Never used  Substance Use Topics   Alcohol use: Not Currently   Drug use: No     Allergies   Benazepril hcl   Review of Systems Review of Systems See HPI  Physical Exam Triage Vital Signs ED Triage Vitals  Enc Vitals Group     BP 06/07/22 1311 (!) 143/74     Pulse Rate 06/07/22 1311 (!) 51     Resp 06/07/22 1311 16     Temp 06/07/22 1311 97.8 F (36.6 C)     Temp Source 06/07/22 1311 Oral     SpO2 06/07/22 1311 97 %     Weight --      Height --      Head Circumference --      Peak Flow --      Pain Score 06/07/22 1313 7     Pain Loc --      Pain Edu? --      Excl. in [place]? --    No data found.  Updated Vital Signs BP (!) 143/74 (BP Location: Right Arm)   Pulse (!) 51   Temp 97.8 F (36.6 C) (Oral)   Resp 16   SpO2 97%        Physical Exam Constitutional:      General: She is not in acute distress.    Appearance: She is well-developed. She is obese. She is not ill-appearing.  HENT:     Head: Normocephalic and atraumatic.  Eyes:     Conjunctiva/sclera: Conjunctivae normal.     Pupils: Pupils are equal, round, and reactive to light.  Cardiovascular:     Rate and Rhythm: Normal rate and regular rhythm.     Heart sounds: Normal heart sounds.  Pulmonary:     Effort: Pulmonary effort is normal. No respiratory distress.     Breath sounds: Normal breath sounds.  Chest:     Chest wall: Tenderness present.       Comments: Tenderness along the lower rib border laterally either side of sternum/xiphoid Abdominal:     General: There is no distension.     Palpations: Abdomen is soft.  Musculoskeletal:        General: Normal range of motion.     Cervical back: Normal range of motion.  Skin:    General: Skin is warm and dry.  Neurological:     Mental Status: She is alert.      UC Treatments / Results  Labs (all labs ordered are listed, but only abnormal results are displayed) Labs Reviewed - No data to display  EKG   Radiology DG Chest 2 View  Result Date: 06/07/2022 CLINICAL DATA:  fell face forward and has chest wall pain anterior EXAM: CHEST - 2 VIEW COMPARISON:  None Available. FINDINGS: The heart and mediastinal contours are within normal limits. Low lung volumes. Bibasilar streaky airspace opacities. No focal consolidation. No pulmonary edema. No pleural effusion. No pneumothorax. No acute osseous abnormality. IMPRESSION: No active cardiopulmonary disease. Electronically Signed   By: Iven Finn M.D.   On: 06/07/2022 14:05    Procedures Procedures (including critical care time)  Medications Ordered in UC Medications - No data to display  Initial Impression / Assessment and Plan / UC Course  I have reviewed the triage vital signs and the nursing notes.  Pertinent labs & imaging results that were available during my care of the patient were reviewed by me and considered in my medical decision making (see chart for details).     Ribs are not broken.  Lungs are clear.  Discussed costochondritis and contusion of chest wall. Final Clinical Impressions(s) / UC Diagnoses   Final diagnoses:  Contusion of chest wall, unspecified laterality, initial encounter     Discharge Instructions      Take Naprosyn  for pain Avoid movement and activity that hurts See your doctor if not improving by next week     ED Prescriptions     Medication Sig Dispense Auth. Provider   naproxen (NAPROSYN) 375 MG tablet Take 1 tablet 2-3 times a day with food.  This will help with pain and inflammation 30 tablet Meda Coffee Jennette Banker, MD      PDMP not reviewed this encounter.   Raylene Everts, MD 06/07/22 [phone]

## 2022-06-07 NOTE — ED Triage Notes (Signed)
Pt c/o pain in her sternum area after falling at gym on Monday. Says she fell chest first. Hurts to breathe/cough. Hot baths prn. Pain 7/10

## 2022-06-26 NOTE — Patient Instructions (Addendum)
      Blood work was ordered.   The lab is on the first floor.    Medications changes include :       A referral was ordered for XXX.     Someone will call you to schedule an appointment.    Return in about 6 months (around 12/26/2022) for Physical Exam.

## 2022-06-26 NOTE — Progress Notes (Unsigned)
      Subjective:    Patient ID: [name], female    DOB: 09-May-1956, 67 y.o.   MRN: [id number]     HPI [name] is here for follow up of her chronic medical problems, including htn, DM, hld, OSA, obesity, asthma  Start farxiag  Medications and allergies reviewed with patient and updated if appropriate.  Current Outpatient Medications on File Prior to Visit  Medication Sig Dispense Refill   albuterol (VENTOLIN HFA) 108 (90 Base) MCG/ACT inhaler Inhale 2 puffs into the lungs every 6 (six) hours as needed for wheezing or shortness of breath. 8 g 0   amLODipine (NORVASC) 5 MG tablet TAKE 1 TABLET BY MOUTH DAILY 100 tablet 2   aspirin 81 MG tablet Take 81 mg by mouth daily.     Biotin 5000 MCG CAPS Take 1 capsule by mouth daily.      Cholecalciferol (VITAMIN D-3) 1000 UNITS CAPS Take 1 capsule by mouth daily.     hydrochlorothiazide (HYDRODIURIL) 12.5 MG tablet TAKE 1 TABLET BY MOUTH DAILY 100 tablet 2   levocetirizine (XYZAL) 5 MG tablet Take 1 tablet (5 mg total) by mouth every evening. 30 tablet 11   metoprolol succinate (TOPROL-XL) 100 MG 24 hr tablet TAKE 1 TABLET BY MOUTH DAILY  WITH OR IMMEDIATELY FOLLOWING A  MEAL 100 tablet 2   naproxen (NAPROSYN) 375 MG tablet Take 1 tablet 2-3 times a day with food.  This will help with pain and inflammation 30 tablet 0   rosuvastatin (CRESTOR) 5 MG tablet TAKE 1 TABLET BY MOUTH DAILY 100 tablet 2   Spacer/Aero-Holding Chambers DEVI UAD for inhaler 1 each 0   No current facility-administered medications on file prior to visit.     Review of Systems     Objective:  There were no vitals filed for this visit. BP Readings from Last 3 Encounters:  06/07/22 (!) 143/74  02/28/22 120/70  01/09/22 128/78   Wt Readings from Last 3 Encounters:  01/09/22 290 lb (131.5 kg)  12/12/21 294 lb (133.4 kg)  07/19/21 292 lb 3.2 oz (132.5 kg)   There is no height or weight on file to calculate BMI.    Physical Exam     Lab Results   Component Value Date   WBC 8.5 12/12/2021   HGB 13.4 12/12/2021   HCT 41.7 12/12/2021   PLT 225.0 12/12/2021   GLUCOSE 111 (H) 12/12/2021   CHOL 115 12/12/2021   TRIG 86.0 12/12/2021   HDL 41.40 12/12/2021   LDLCALC 56 12/12/2021   ALT 11 12/12/2021   AST 16 12/12/2021   NA 139 12/12/2021   K 3.9 12/12/2021   CL 102 12/12/2021   CREATININE 1.10 12/12/2021   BUN 17 12/12/2021   CO2 31 12/12/2021   TSH 2.49 12/16/2018   HGBA1C 6.9 (H) 12/12/2021   MICROALBUR 0.7 06/13/2021     Assessment & Plan:    See Problem List for Assessment and Plan of chronic medical problems.

## 2022-06-27 ENCOUNTER — Ambulatory Visit (INDEPENDENT_AMBULATORY_CARE_PROVIDER_SITE_OTHER): Payer: Medicare Other | Admitting: Internal Medicine

## 2022-06-27 ENCOUNTER — Encounter: Payer: Self-pay | Admitting: Internal Medicine

## 2022-06-27 VITALS — BP 124/76 | HR 58 | Temp 98.2°F | Ht 65.0 in | Wt 282.0 lb

## 2022-06-27 DIAGNOSIS — J069 Acute upper respiratory infection, unspecified: Secondary | ICD-10-CM | POA: Diagnosis not present

## 2022-06-27 DIAGNOSIS — G4730 Sleep apnea, unspecified: Secondary | ICD-10-CM

## 2022-06-27 DIAGNOSIS — E119 Type 2 diabetes mellitus without complications: Secondary | ICD-10-CM | POA: Diagnosis not present

## 2022-06-27 DIAGNOSIS — Z6841 Body Mass Index (BMI) 40.0 and over, adult: Secondary | ICD-10-CM

## 2022-06-27 DIAGNOSIS — E782 Mixed hyperlipidemia: Secondary | ICD-10-CM

## 2022-06-27 DIAGNOSIS — E662 Morbid (severe) obesity with alveolar hypoventilation: Secondary | ICD-10-CM

## 2022-06-27 DIAGNOSIS — J45991 Cough variant asthma: Secondary | ICD-10-CM | POA: Diagnosis not present

## 2022-06-27 DIAGNOSIS — I10 Essential (primary) hypertension: Secondary | ICD-10-CM | POA: Diagnosis not present

## 2022-06-27 LAB — LIPID PANEL
Cholesterol: 132 mg/dL (ref 0–200)
HDL: 40.6 mg/dL (ref >39.00)
LDL Cholesterol: 54 mg/dL (ref 0–99)
NonHDL: 91.28
Total CHOL/HDL Ratio: 3
Triglycerides: 185 mg/dL — ABNORMAL HIGH (ref 0.0–149.0)
VLDL: 37 mg/dL (ref 0.0–40.0)

## 2022-06-27 LAB — COMPREHENSIVE METABOLIC PANEL
ALT: 14 U/L (ref 0–35)
AST: 19 U/L (ref 0–37)
Albumin: 4.1 g/dL (ref 3.5–5.2)
Alkaline Phosphatase: 93 U/L (ref 39–117)
BUN: 20 mg/dL (ref 6–23)
CO2: 33 mEq/L — ABNORMAL HIGH (ref 19–32)
Calcium: 10 mg/dL (ref 8.4–10.5)
Chloride: 101 mEq/L (ref 96–112)
Creatinine, Ser: 1.13 mg/dL (ref 0.40–1.20)
GFR: 50.79 mL/min — ABNORMAL LOW (ref >60.00)
Glucose, Bld: 92 mg/dL (ref 70–99)
Potassium: 4 mEq/L (ref 3.5–5.1)
Sodium: 141 mEq/L (ref 135–145)
Total Bilirubin: 0.3 mg/dL (ref 0.2–1.2)
Total Protein: 7.6 g/dL (ref 6.0–8.3)

## 2022-06-27 LAB — MICROALBUMIN / CREATININE URINE RATIO
Creatinine,U: 181 mg/dL
Microalb Creat Ratio: 0.4 mg/g (ref 0.0–30.0)
Microalb, Ur: <0.7 mg/dL (ref 0.0–1.9)

## 2022-06-27 NOTE — Assessment & Plan Note (Signed)
Chronic Encourage weight loss Stressed regular exercise Advise decrease portions, discussed healthy diet

## 2022-06-27 NOTE — Assessment & Plan Note (Signed)
Acute Symptoms likely viral in nature Continue symptomatic treatment with over-the-counter cold medications, Tylenol/ibuprofen Increase rest and fluids Call if symptoms worsen or do not improve

## 2022-06-27 NOTE — Assessment & Plan Note (Signed)
Chronic Blood pressure well controlled CMP Continue amlodipine 5 mg daily, HCTZ 12.5 mg daily, metoprolol XL 100 mg daily

## 2022-06-27 NOTE — Assessment & Plan Note (Signed)
Chronic Controlled Continue Xyzal 5 mg nightly, albuterol inhaler as needed

## 2022-06-27 NOTE — Assessment & Plan Note (Addendum)
Chronic   Lab Results  Component Value Date   HGBA1C 6.9 (H) 12/12/2021   Sugars controlled Check A1c, urine microalbumin today Not currently on medication Continue lifestyle control Stressed regular exercise, diabetic diet

## 2022-06-27 NOTE — Assessment & Plan Note (Signed)
Chronic Using CPAP nightly 

## 2022-06-27 NOTE — Assessment & Plan Note (Signed)
Chronic Regular exercise and healthy diet encouraged Check lipid panel  Continue Crestor 5 mg daily 

## 2022-06-28 LAB — HEMOGLOBIN A1C: Hgb A1c MFr Bld: 7.1 % — ABNORMAL HIGH (ref 4.6–6.5)

## 2022-07-18 NOTE — Progress Notes (Unsigned)
PATIENT: [name] DOB: 16-Jul-1955  REASON FOR VISIT: follow up HISTORY FROM: patient  Chief Complaint  Patient presents with   RM 18    Here alone for CPAP f/u. She states she hates it but she uses it. She would be interested in alternative treatment if available. ESS 8.      HISTORY OF PRESENT ILLNESS: Today 07/19/22:  [name] is a 67 y.o. female with a history of OSA on CPAP. Returns today for follow-up.  She reports that she does not like using the CPAP but she does try to use it consistently.  She states that since she is been on CPAP she has not noticed a difference in how she feels.  She questions whether the home sleep test is completely accurate since she has not noticed any changes in the way she feels.  Her download is below      07/19/21: [name] is a 67 year old female with a history of obstructive sleep apnea on CPAP.  She returns today for follow-up.  She reports that she does not like using the CPAP but understands the risk associated with untreated sleep apnea.  She did inquire about inspire however her BMI is elevated.  She is trying to work on weight loss.  She returns today for evaluation.   04/06/20: [name] is a 67 year old female with a history of obstructive sleep apnea on CPAP.  Her download indicates that she used her machine 29 out of 30 days for compliance of 97%.  She used her machine greater than 4 hours 28 days for compliance of 93%.  On average she uses her machine 7 hours and 3 minutes.  Her residual AHI is 1.5 on 6 to 18 cm of water with EPR 2.  Leak in the 95th percentile is 8.6.  The patient states that she has tried to remain faithful to using her CPAP.  Although she has not been able to tell a big difference.  She states that she never really had symptoms before being diagnosed.  She states that she is diabetic with hemoglobin A1c at 6.8.  She states that she has been trying to fix her diet.  Currently she only eats 1 big meal a  day.  She returns today for an evaluation.    REVIEW OF SYSTEMS: Out of a complete 14 system review of symptoms, the patient complains only of the following symptoms, and all other reviewed systems are negative.  ESS 8  ALLERGIES: Allergies  Allergen Reactions   Benazepril Hcl     REACTION: angioedema of lip. She cannot take angiotensin receptor blockers because of this history.    HOME MEDICATIONS: Outpatient Medications Prior to Visit  Medication Sig Dispense Refill   amLODipine (NORVASC) 5 MG tablet TAKE 1 TABLET BY MOUTH DAILY 100 tablet 2   aspirin 81 MG tablet Take 81 mg by mouth daily.     Biotin 5000 MCG CAPS Take 1 capsule by mouth daily.      Cholecalciferol (VITAMIN D-3) 1000 UNITS CAPS Take 1 capsule by mouth daily.     hydrochlorothiazide (HYDRODIURIL) 12.5 MG tablet TAKE 1 TABLET BY MOUTH DAILY 100 tablet 2   levocetirizine (XYZAL) 5 MG tablet Take 1 tablet (5 mg total) by mouth every evening. 30 tablet 11   metoprolol succinate (TOPROL-XL) 100 MG 24 hr tablet TAKE 1 TABLET BY MOUTH DAILY  WITH OR IMMEDIATELY FOLLOWING A  MEAL 100 tablet 2   rosuvastatin (CRESTOR) 5 MG tablet  TAKE 1 TABLET BY MOUTH DAILY 100 tablet 2   albuterol (VENTOLIN HFA) 108 (90 Base) MCG/ACT inhaler Inhale 2 puffs into the lungs every 6 (six) hours as needed for wheezing or shortness of breath. (Patient not taking: Reported on 07/19/2022) 8 g 0   naproxen (NAPROSYN) 375 MG tablet Take 1 tablet 2-3 times a day with food.  This will help with pain and inflammation (Patient not taking: Reported on 07/19/2022) 30 tablet 0   No facility-administered medications prior to visit.    PAST MEDICAL HISTORY: Past Medical History:  Diagnosis Date   Alopecia    Diabetes mellitus without complication ([facility])    Herpes zoster 1992   posterior thorax   Hyperlipidemia 2006    LDL 104, HDL 50   Hypertension    OA (osteoarthritis) of knee    Bilateral   Other abnormal glucose    Ovarian cyst    Small    Pre-diabetes    Uterine fibroid    Vitamin D deficiency     PAST SURGICAL HISTORY: Past Surgical History:  Procedure Laterality Date   COLONOSCOPY  2007, 07/25/2016   negative   CYSTECTOMY     peri rectal   FOOT SURGERY     bilaterally   INCONTINENCE SURGERY  2005   Dr Edward Jolly   LIPOMA EXCISION  2011    L chest , Dr Corliss Skains   SHOULDER ARTHROSCOPY WITH ROTATOR CUFF REPAIR AND SUBACROMIAL DECOMPRESSION Right 10/18/2017   Procedure: Right shoulder arthroscopy, subacromial decompression, labrial debridement mini open rotator cuff repair;  Surgeon: Jene Every, MD;  Location: [facility];  Service: Orthopedics;  Laterality: Right;  120 mins   SHOULDER ARTHROSCOPY WITH ROTATOR CUFF REPAIR AND SUBACROMIAL DECOMPRESSION Left 07/02/2018   Procedure: Left shoulder arthroscopy, subacromial decompression, mini open rotator cuff repair,;  Surgeon: Jene Every, MD;  Location: [facility];  Service: Orthopedics;  Laterality: Left;  90 mins    FAMILY HISTORY: Family History  Problem Relation Age of Onset   Diabetes Mother    Stroke Father 56   Hypertension Father    Hypertension Sister    Hypertension Maternal Aunt    Diabetes Maternal Aunt    Cancer Neg Hx    Colon cancer Neg Hx    Breast cancer Neg Hx     SOCIAL HISTORY: Social History   Socioeconomic History   Marital status: Single    Spouse name: Not on file   Number of children: Not on file   Years of education: Not on file   Highest education level: Not on file  Occupational History   Not on file  Tobacco Use   Smoking status: Former    Years: 6.00    Types: Cigarettes    Quit date: 06/25/1980    Years since quitting: 42.0   Smokeless tobacco: Never   Tobacco comments:    smoked 16-25 , up to 1/2 ppd  Vaping Use   Vaping Use: Never used  Substance and Sexual Activity   Alcohol use: Not Currently   Drug use: No   Sexual activity: Yes    Birth control/protection: Post-menopausal  Other Topics Concern   Not on file  Social  History Narrative   Lives alone    Left handed   Caffeine: 1 cup/day at most    Was exercising 5 days/week but fell 1 month ago and has been out of the gym    Social Determinants of Health   Financial Resource Strain: Low Risk  (  02/28/2022)   Overall Financial Resource Strain (CARDIA)    Difficulty of Paying Living Expenses: Not hard at all  Food Insecurity: No Food Insecurity (02/28/2022)   Hunger Vital Sign    Worried About Running Out of Food in the Last Year: Never true    Ran Out of Food in the Last Year: Never true  Transportation Needs: No Transportation Needs (02/28/2022)   PRAPARE - Administrator, Civil Service (Medical): No    Lack of Transportation (Non-Medical): No  Physical Activity: Sufficiently Active (02/28/2022)   Exercise Vital Sign    Days of Exercise per Week: 4 days    Minutes of Exercise per Session: 60 min  Stress: No Stress Concern Present (02/28/2022)   [employer] of Occupational Health - Occupational Stress Questionnaire    Feeling of Stress : Not at all  Social Connections: Socially Integrated (02/28/2022)   Social Connection and Isolation Panel [NHANES]    Frequency of Communication with Friends and Family: More than three times a week    Frequency of Social Gatherings with Friends and Family: More than three times a week    Attends Religious Services: More than 4 times per year    Active Member of [employer] or Organizations: Yes    Attends Banker Meetings: More than 4 times per year    Marital Status: Married  Catering manager Violence: Not At Risk (02/28/2022)   Humiliation, Afraid, Rape, and Kick questionnaire    Fear of Current or Ex-Partner: No    Emotionally Abused: No    Physically Abused: No    Sexually Abused: No      PHYSICAL EXAM  Vitals:   07/19/22 1327  BP: 133/76  Pulse: 62  Weight: 280 lb (127 kg)  Height: 5\' 4"  (1.626 m)   Body mass index is 48.06 kg/m.  Generalized: Well developed, in no acute distress   Chest: Lungs clear to auscultation bilaterally  Neurological examination  Mentation: Alert oriented to time, place, history taking. Follows all commands speech and language fluent  Gait and station: Gait is normal.    DIAGNOSTIC DATA (LABS, IMAGING, TESTING) - I reviewed patient records, labs, notes, testing and imaging myself where available.  Lab Results  Component Value Date   WBC 8.5 12/12/2021   HGB 13.4 12/12/2021   HCT 41.7 12/12/2021   MCV 91.2 12/12/2021   PLT 225.0 12/12/2021      Component Value Date/Time   NA 141 06/27/2022 1454   K 4.0 06/27/2022 1454   CL 101 06/27/2022 1454   CO2 33 (H) 06/27/2022 1454   GLUCOSE 92 06/27/2022 1454   BUN 20 06/27/2022 1454   CREATININE 1.13 06/27/2022 1454   CREATININE 1.12 (H) 08/11/2020 1548   CALCIUM 10.0 06/27/2022 1454   PROT 7.6 06/27/2022 1454   ALBUMIN 4.1 06/27/2022 1454   AST 19 06/27/2022 1454   ALT 14 06/27/2022 1454   ALKPHOS 93 06/27/2022 1454   BILITOT 0.3 06/27/2022 1454   GFRNONAA 60 (L) 06/23/2018 1531   GFRAA >60 06/23/2018 1531   Lab Results  Component Value Date   CHOL 132 06/27/2022   HDL 40.60 06/27/2022   LDLCALC 54 06/27/2022   TRIG 185.0 (H) 06/27/2022   CHOLHDL 3 06/27/2022   Lab Results  Component Value Date   HGBA1C 7.1 (H) 06/27/2022   No results found for: "VITAMINB12" Lab Results  Component Value Date   TSH 2.49 12/16/2018      ASSESSMENT AND PLAN  67 y.o. year old female  has a past medical history of Alopecia, Diabetes mellitus without complication (HCC), Herpes zoster (1992), Hyperlipidemia (2006), Hypertension, OA (osteoarthritis) of knee, Other abnormal glucose, Ovarian cyst, Pre-diabetes, Uterine fibroid, and Vitamin D deficiency. here with:  OSA on CPAP  - CPAP compliance excellent - Good treatment of AHI  - Encourage patient to use CPAP nightly and > 4 hours each night -Discussed inspire device however her BMI is elevated.  We discussed weight loss. -Repeat home  sleep test to verify results. - F/U in 1 year or sooner if needed    Butch Penny, MSN, NP-C 07/19/2022, 1:42 PM [facility] [address], [address] [place], [place] [address] [phone]

## 2022-07-19 ENCOUNTER — Ambulatory Visit: Payer: Medicare Other | Admitting: Adult Health

## 2022-07-19 ENCOUNTER — Encounter: Payer: Self-pay | Admitting: Adult Health

## 2022-07-19 VITALS — BP 133/76 | HR 62 | Ht 64.0 in | Wt 280.0 lb

## 2022-07-19 DIAGNOSIS — G4733 Obstructive sleep apnea (adult) (pediatric): Secondary | ICD-10-CM

## 2022-07-19 NOTE — Patient Instructions (Signed)
Continue using CPAP nightly and greater than 4 hours each night Repeat Home sleep test If your symptoms worsen or you develop new symptoms please let us know.

## 2022-07-25 LAB — HM DIABETES EYE EXAM

## 2022-08-14 ENCOUNTER — Ambulatory Visit (INDEPENDENT_AMBULATORY_CARE_PROVIDER_SITE_OTHER): Payer: Medicare Other | Admitting: Neurology

## 2022-08-14 DIAGNOSIS — G4733 Obstructive sleep apnea (adult) (pediatric): Secondary | ICD-10-CM

## 2022-08-14 DIAGNOSIS — E662 Morbid (severe) obesity with alveolar hypoventilation: Secondary | ICD-10-CM

## 2022-08-14 DIAGNOSIS — E669 Obesity, unspecified: Secondary | ICD-10-CM

## 2022-08-16 DIAGNOSIS — E669 Obesity, unspecified: Secondary | ICD-10-CM | POA: Insufficient documentation

## 2022-08-16 NOTE — Progress Notes (Signed)
[facility] TEST REPORT ( by Watch PAT)   STUDY DATE:  08-16-2022 DOB:   MRN:    ORDERING CLINICIAN:  REFERRING CLINICIAN:  Binnie Rail, MD   CLINICAL INFORMATION/HISTORY: last visit 07-19-2021 with MM, NP. Ms. Tripi is a 67 year old female with a history of obstructive sleep apnea on CPAP.  She returns today for follow-up.  She reports that she does not like using the CPAP but understands the risk associated with untreated sleep apnea.   AHI 1.6/h.  between 6-18 cm water with 2 cm EPR, 100% compliance by days, and 90% compliance by hours.   Epworth sleepiness score: 8 /24. FSS at X/ 63 ?    BMI: 48.6 kg/m   Neck Circumference: 16 inches   FINDINGS:   Sleep Summary:   Total Recording Time (hours, min):    9 hours 8 minutes   Total Sleep Time (hours, min):     8 hours 10 minutes            Percent REM (%):   27%                                     Respiratory Indices:   Calculated pAHI (per hour):     56.8/h                        REM pAHI:     75/h                                            NREM pAHI:      50/h                        Positional  AHI:    Supine sleep was associated with an AHI of 16.6/h, left lateral sleep with an AHI of 76.6/h and right-sided sleep with an AHI of 26.5/h.  Snoring statistics show a mean volume of 41 dB present for 20% of the total sleep time.                                               Oxygen Saturation Statistics:      O2 Saturation Range (%):   Between a nadir at 74% and maximum saturation of 99% with a mean oxygen saturation of 93%.                                    O2 Saturation (minutes) <89%: 5 minutes There were 274 brief oxygen desaturation events.           Pulse Rate Statistics:    Pulse Range:   Between 40 and 122 bpm with a mean heart rate of 55 bpm.  Please note that a cardiac rhythm cannot be determined by this home sleep test.              IMPRESSION:  This HST confirms the presence  of severe sleep apnea of obstructive origin.  During REM sleep the AHI is increased  but this is not REM sleep dependent apnea.  The sleep position also seems not to influence the AHI as much. The oxygen nadir was low but oxygen desaturations were not maintained. There was bradycardia and tachycardia seen.   RECOMMENDATION: Based on the status with the severity of apnea, the oxygen nadir and the overall fragmentation of sleep and response to apnea, I strongly recommend positive airway pressure therapy to continue.  If the patient is due for a new machine I would order the same settings she has been using with great success.  A new machine should be a ResMed brand auto titration CPAP device with heated humidification and the patient's choice of mask.    INTERPRETING PHYSICIAN:   Larey Seat, MD   Medical Director of [facility] at [facility].

## 2022-08-16 NOTE — Procedures (Signed)
[facility] [place] TEST REPORT ( by Watch PAT)   STUDY DATE:  08-16-2022 DOB:   MRN:    ORDERING CLINICIAN:  REFERRING CLINICIAN:  Binnie Rail, MD   CLINICAL INFORMATION/HISTORY: last visit 07-19-2021 with MM, NP. Ms. Panda is a 67 year old female with a history of obstructive sleep apnea on CPAP.  She returns today for follow-up.  She reports that she does not like using the CPAP but understands the risk associated with untreated sleep apnea.   AHI 1.6/h.  between 6-18 cm water with 2 cm EPR, 100% compliance by days, and 90% compliance by hours.   Epworth sleepiness score: 8 /24. FSS at X/ 63 ?    BMI: 48.6 kg/m   Neck Circumference: 16 inches   FINDINGS:   Sleep Summary:   Total Recording Time (hours, min):    9 hours 8 minutes   Total Sleep Time (hours, min):     8 hours 10 minutes            Percent REM (%):   27%                                     Respiratory Indices:   Calculated pAHI (per hour):     56.8/h                        REM pAHI:     75/h                                            NREM pAHI:      50/h                        Positional  AHI:    Supine sleep was associated with an AHI of 16.6/h, left lateral sleep with an AHI of 76.6/h and right-sided sleep with an AHI of 26.5/h.  Snoring statistics show a mean volume of 41 dB present for 20% of the total sleep time.                                               Oxygen Saturation Statistics:      O2 Saturation Range (%):   Between a nadir at 74% and maximum saturation of 99% with a mean oxygen saturation of 93%.                                    O2 Saturation (minutes) <89%: 5 minutes There were 274 brief oxygen desaturation events.           Pulse Rate Statistics:    Pulse Range:   Between 40 and 122 bpm with a mean heart rate of 55 bpm.  Please note that a cardiac rhythm cannot be determined by this home sleep test.              IMPRESSION:  This HST confirms the presence of  severe sleep apnea of obstructive origin.  During REM sleep the AHI is increased but this  is not REM sleep dependent apnea.  The sleep position also seems not to influence the AHI as much. The oxygen nadir was low but oxygen desaturations were not maintained. There was bradycardia and tachycardia seen.   RECOMMENDATION: Based on the status with the severity of apnea, the oxygen nadir and the overall fragmentation of sleep and response to apnea, I strongly recommend positive airway pressure therapy to continue.  If the patient is due for a new machine I would order the same settings she has been using with great success.  A new machine should be a ResMed brand auto titration CPAP device with heated humidification and the patient's choice of mask.    INTERPRETING PHYSICIAN:   Larey Seat, MD   Medical Director of [facility] at [facility].

## 2022-08-22 DIAGNOSIS — G4733 Obstructive sleep apnea (adult) (pediatric): Secondary | ICD-10-CM | POA: Diagnosis not present

## 2022-08-29 ENCOUNTER — Encounter: Payer: Self-pay | Admitting: Internal Medicine

## 2022-08-29 NOTE — Progress Notes (Signed)
Outside notes received. Information abstracted. Notes sent to scan.  

## 2022-11-09 ENCOUNTER — Other Ambulatory Visit: Payer: Self-pay | Admitting: Internal Medicine

## 2022-12-01 ENCOUNTER — Other Ambulatory Visit: Payer: Self-pay | Admitting: Internal Medicine

## 2022-12-01 DIAGNOSIS — I10 Essential (primary) hypertension: Secondary | ICD-10-CM

## 2022-12-25 ENCOUNTER — Encounter: Payer: Self-pay | Admitting: Internal Medicine

## 2022-12-25 NOTE — Patient Instructions (Addendum)
Blood work was ordered.   The lab is on the first floor.    Medications changes include :   none      Return in about 6 months (around 06/28/2023) for follow up.    Health Maintenance, Female Adopting a healthy lifestyle and getting preventive care are important in promoting health and wellness. Ask your health care provider about: The right schedule for you to have regular tests and exams. Things you can do on your own to prevent diseases and keep yourself healthy. What should I know about diet, weight, and exercise? Eat a healthy diet  Eat a diet that includes plenty of vegetables, fruits, low-fat dairy products, and lean protein. Do not eat a lot of foods that are high in solid fats, added sugars, or sodium. Maintain a healthy weight Body mass index (BMI) is used to identify weight problems. It estimates body fat based on height and weight. Your health care provider can help determine your BMI and help you achieve or maintain a healthy weight. Get regular exercise Get regular exercise. This is one of the most important things you can do for your health. Most adults should: Exercise for at least 150 minutes each week. The exercise should increase your heart rate and make you sweat (moderate-intensity exercise). Do strengthening exercises at least twice a week. This is in addition to the moderate-intensity exercise. Spend less time sitting. Even light physical activity can be beneficial. Watch cholesterol and blood lipids Have your blood tested for lipids and cholesterol at 67 years of age, then have this test every 5 years. Have your cholesterol levels checked more often if: Your lipid or cholesterol levels are high. You are older than 67 years of age. You are at high risk for heart disease. What should I know about cancer screening? Depending on your health history and family history, you may need to have cancer screening at various ages. This may include screening  for: Breast cancer. Cervical cancer. Colorectal cancer. Skin cancer. Lung cancer. What should I know about heart disease, diabetes, and high blood pressure? Blood pressure and heart disease High blood pressure causes heart disease and increases the risk of stroke. This is more likely to develop in people who have high blood pressure readings or are overweight. Have your blood pressure checked: Every 3-5 years if you are 92-19 years of age. Every year if you are 85 years old or older. Diabetes Have regular diabetes screenings. This checks your fasting blood sugar level. Have the screening done: Once every three years after age 52 if you are at a normal weight and have a low risk for diabetes. More often and at a younger age if you are overweight or have a high risk for diabetes. What should I know about preventing infection? Hepatitis B If you have a higher risk for hepatitis B, you should be screened for this virus. Talk with your health care provider to find out if you are at risk for hepatitis B infection. Hepatitis C Testing is recommended for: Everyone born from [birth year] through 1965. Anyone with known risk factors for hepatitis C. Sexually transmitted infections (STIs) Get screened for STIs, including gonorrhea and chlamydia, if: You are sexually active and are younger than 67 years of age. You are older than 67 years of age and your health care provider tells you that you are at risk for this type of infection. Your sexual activity has changed since you were last screened, and you  increased risk for chlamydia or gonorrhea. Ask your health care provider if you are at risk. Ask your health care provider about whether you are at high risk for HIV. Your health care provider may recommend a prescription medicine to help prevent HIV infection. If you choose to take medicine to prevent HIV, you should first get tested for HIV. You should then be tested every 3 months for as long as you  are taking the medicine. Pregnancy If you are about to stop having your period (premenopausal) and you may become pregnant, seek counseling before you get pregnant. Take 400 to 800 micrograms (mcg) of folic acid every day if you become pregnant. Ask for birth control (contraception) if you want to prevent pregnancy. Osteoporosis and menopause Osteoporosis is a disease in which the bones lose minerals and strength with aging. This can result in bone fractures. If you are 65 years old or older, or if you are at risk for osteoporosis and fractures, ask your health care provider if you should: Be screened for bone loss. Take a calcium or vitamin D supplement to lower your risk of fractures. Be given hormone replacement therapy (HRT) to treat symptoms of menopause. Follow these instructions at home: Alcohol use Do not drink alcohol if: Your health care provider tells you not to drink. You are pregnant, may be pregnant, or are planning to become pregnant. If you drink alcohol: Limit how much you have to: 0-1 drink a day. Know how much alcohol is in your drink. In the [place] one drink equals one 12 oz bottle of beer (355 mL), one 5 oz glass of wine (148 mL), or one 1 oz glass of hard liquor (44 mL). Lifestyle Do not use any products that contain nicotine or tobacco. These products include cigarettes, chewing tobacco, and vaping devices, such as e-cigarettes. If you need help quitting, ask your health care provider. Do not use street drugs. Do not share needles. Ask your health care provider for help if you need support or information about quitting drugs. General instructions Schedule regular health, dental, and eye exams. Stay current with your vaccines. Tell your health care provider if: You often feel depressed. You have ever been abused or do not feel safe at home. Summary Adopting a healthy lifestyle and getting preventive care are important in promoting health and wellness. Follow your  health care provider's instructions about healthy diet, exercising, and getting tested or screened for diseases. Follow your health care provider's instructions on monitoring your cholesterol and blood pressure. This information is not intended to replace advice given to you by your health care provider. Make sure you discuss any questions you have with your health care provider. Document Revised: 10/31/2020 Document Reviewed: 10/31/2020 Elsevier Patient Education  2024 Elsevier Inc.  

## 2022-12-25 NOTE — Progress Notes (Unsigned)
Subjective:    Patient ID: [name], female    DOB: 08/25/1955, 67 y.o.   MRN: [id number]      HPI [name] is here for a Physical exam and her chronic medical problems.      Medications and allergies reviewed with patient and updated if appropriate.  Current Outpatient Medications on File Prior to Visit  Medication Sig Dispense Refill   amLODipine (NORVASC) 5 MG tablet TAKE 1 TABLET BY MOUTH DAILY 100 tablet 2   aspirin 81 MG tablet Take 81 mg by mouth daily.     Biotin 5000 MCG CAPS Take 1 capsule by mouth daily.      Cholecalciferol (VITAMIN D-3) 1000 UNITS CAPS Take 1 capsule by mouth daily.     hydrochlorothiazide (HYDRODIURIL) 12.5 MG tablet TAKE 1 TABLET BY MOUTH DAILY 100 tablet 2   levocetirizine (XYZAL) 5 MG tablet Take 1 tablet (5 mg total) by mouth every evening. 30 tablet 11   metoprolol succinate (TOPROL-XL) 100 MG 24 hr tablet TAKE 1 TABLET BY MOUTH DAILY  WITH OR IMMEDIATELY FOLLOWING A  MEAL 100 tablet 2   rosuvastatin (CRESTOR) 5 MG tablet TAKE 1 TABLET BY MOUTH DAILY 100 tablet 2   No current facility-administered medications on file prior to visit.    Review of Systems  Constitutional:  Negative for fever.  Eyes:  Negative for visual disturbance.  Respiratory:  Negative for cough, shortness of breath and wheezing.   Cardiovascular:  Negative for chest pain, palpitations and leg swelling.  Gastrointestinal:  Negative for abdominal pain, blood in stool, constipation and diarrhea.       No gerd  Genitourinary:  Negative for dysuria.  Musculoskeletal:  Positive for arthralgias (left knee pain) and back pain (occ - hurt yesterday - better today).  Skin:  Negative for rash.  Neurological:  Negative for light-headedness and headaches.  Psychiatric/Behavioral:  Negative for dysphoric mood. The patient is not nervous/anxious.        Objective:   Vitals:   12/26/22 1349  BP: 118/80  Pulse: 80  Temp: 98.3 F (36.8 C)  SpO2: 98%   Filed Weights    12/26/22 1349  Weight: 282 lb (127.9 kg)   Body mass index is 48.41 kg/m.  BP Readings from Last 3 Encounters:  12/26/22 118/80  07/19/22 133/76  06/27/22 124/76    Wt Readings from Last 3 Encounters:  12/26/22 282 lb (127.9 kg)  07/19/22 280 lb (127 kg)  06/27/22 282 lb (127.9 kg)       Physical Exam Constitutional: She appears well-developed and well-nourished. No distress.  HENT:  Head: Normocephalic and atraumatic.  Right Ear: External ear normal. Normal ear canal and TM Left Ear: External ear normal.  Normal ear canal and TM Mouth/Throat: Oropharynx is clear and moist.  Eyes: Conjunctivae normal.  Neck: Neck supple. No tracheal deviation present. No thyromegaly present.  No carotid bruit  Cardiovascular: Normal rate, regular rhythm and normal heart sounds.   No murmur heard.  No edema. Pulmonary/Chest: Effort normal and breath sounds normal. No respiratory distress. She has no wheezes. She has no rales.  Breast: deferred   Abdominal: Soft. She exhibits no distension. There is no tenderness.  Lymphadenopathy: She has no cervical adenopathy.  Skin: Skin is warm and dry. She is not diaphoretic.  Psychiatric: She has a normal mood and affect. Her behavior is normal.     Lab Results  Component Value Date   WBC 8.5 12/12/2021   HGB  13.4 12/12/2021   HCT 41.7 12/12/2021   PLT 225.0 12/12/2021   GLUCOSE 92 06/27/2022   CHOL 132 06/27/2022   TRIG 185.0 (H) 06/27/2022   HDL 40.60 06/27/2022   LDLCALC 54 06/27/2022   ALT 14 06/27/2022   AST 19 06/27/2022   NA 141 06/27/2022   K 4.0 06/27/2022   CL 101 06/27/2022   CREATININE 1.13 06/27/2022   BUN 20 06/27/2022   CO2 33 (H) 06/27/2022   TSH 2.49 12/16/2018   HGBA1C 7.1 (H) 06/27/2022   MICROALBUR <0.7 06/27/2022         Assessment & Plan:   Physical exam: Screening blood work  ordered Exercise  swimming 3-4 / week, walking 1-2 miles a day Weight  encouraged weight loss Substance abuse   none   Reviewed recommended immunizations.   Health Maintenance  Topic Date Due   Zoster Vaccines- Shingrix (1 of 2) Never done   COVID-19 Vaccine (4 - 2023-24 season) 05/15/2022   HEMOGLOBIN A1C  12/26/2022   INFLUENZA VACCINE  01/24/2023   Medicare Annual Wellness (AWV)  03/01/2023   FOOT EXAM  06/12/2023   Diabetic kidney evaluation - eGFR measurement  06/28/2023   Diabetic kidney evaluation - Urine ACR  06/28/2023   OPHTHALMOLOGY EXAM  07/26/2023   MAMMOGRAM  03/12/2024   DEXA SCAN  02/09/2025   Colonoscopy  07/25/2026   DTaP/Tdap/Td (3 - Td or Tdap) 12/15/2028   Pneumonia Vaccine 3+ Years old  Completed   Hepatitis C Screening  Completed   HPV VACCINES  Aged Out          See Problem List for Assessment and Plan of chronic medical problems.

## 2022-12-26 ENCOUNTER — Ambulatory Visit (INDEPENDENT_AMBULATORY_CARE_PROVIDER_SITE_OTHER): Payer: Medicare Other | Admitting: Internal Medicine

## 2022-12-26 VITALS — BP 118/80 | HR 80 | Temp 98.3°F | Ht 64.0 in | Wt 282.0 lb

## 2022-12-26 DIAGNOSIS — Z6841 Body Mass Index (BMI) 40.0 and over, adult: Secondary | ICD-10-CM

## 2022-12-26 DIAGNOSIS — Z0000 Encounter for general adult medical examination without abnormal findings: Secondary | ICD-10-CM | POA: Diagnosis not present

## 2022-12-26 DIAGNOSIS — E119 Type 2 diabetes mellitus without complications: Secondary | ICD-10-CM | POA: Diagnosis not present

## 2022-12-26 DIAGNOSIS — E782 Mixed hyperlipidemia: Secondary | ICD-10-CM | POA: Diagnosis not present

## 2022-12-26 DIAGNOSIS — E559 Vitamin D deficiency, unspecified: Secondary | ICD-10-CM

## 2022-12-26 DIAGNOSIS — I10 Essential (primary) hypertension: Secondary | ICD-10-CM | POA: Diagnosis not present

## 2022-12-26 DIAGNOSIS — E662 Morbid (severe) obesity with alveolar hypoventilation: Secondary | ICD-10-CM | POA: Diagnosis not present

## 2022-12-26 LAB — CBC WITH DIFFERENTIAL/PLATELET
Basophils Absolute: 0.1 10*3/uL (ref 0.0–0.1)
Basophils Relative: 0.9 % (ref 0.0–3.0)
Eosinophils Absolute: 0.2 10*3/uL (ref 0.0–0.7)
Eosinophils Relative: 1.9 % (ref 0.0–5.0)
HCT: 43.1 % (ref 36.0–46.0)
Hemoglobin: 13.9 g/dL (ref 12.0–15.0)
Lymphocytes Relative: 25.7 % (ref 12.0–46.0)
Lymphs Abs: 2.1 10*3/uL (ref 0.7–4.0)
MCHC: 32.3 g/dL (ref 30.0–36.0)
MCV: 88.8 fl (ref 78.0–100.0)
Monocytes Absolute: 0.6 10*3/uL (ref 0.1–1.0)
Monocytes Relative: 7.7 % (ref 3.0–12.0)
Neutro Abs: 5.2 10*3/uL (ref 1.4–7.7)
Neutrophils Relative %: 63.8 % (ref 43.0–77.0)
Platelets: 234 10*3/uL (ref 150.0–400.0)
RBC: 4.85 Mil/uL (ref 3.87–5.11)
RDW: 14.4 % (ref 11.5–15.5)
WBC: 8.1 10*3/uL (ref 4.0–10.5)

## 2022-12-26 LAB — COMPREHENSIVE METABOLIC PANEL
ALT: 13 U/L (ref 0–35)
AST: 21 U/L (ref 0–37)
Albumin: 4.1 g/dL (ref 3.5–5.2)
Alkaline Phosphatase: 86 U/L (ref 39–117)
BUN: 20 mg/dL (ref 6–23)
CO2: 31 mEq/L (ref 19–32)
Calcium: 10.1 mg/dL (ref 8.4–10.5)
Chloride: 103 mEq/L (ref 96–112)
Creatinine, Ser: 1.11 mg/dL (ref 0.40–1.20)
GFR: 51.71 mL/min — ABNORMAL LOW (ref >60.00)
Glucose, Bld: 110 mg/dL — ABNORMAL HIGH (ref 70–99)
Potassium: 3.8 mEq/L (ref 3.5–5.1)
Sodium: 139 mEq/L (ref 135–145)
Total Bilirubin: 0.6 mg/dL (ref 0.2–1.2)
Total Protein: 7.6 g/dL (ref 6.0–8.3)

## 2022-12-26 LAB — HEMOGLOBIN A1C: Hgb A1c MFr Bld: 7 % — ABNORMAL HIGH (ref 4.6–6.5)

## 2022-12-26 LAB — MICROALBUMIN / CREATININE URINE RATIO
Creatinine,U: 200.1 mg/dL
Microalb Creat Ratio: 0.3 mg/g (ref 0.0–30.0)
Microalb, Ur: <0.7 mg/dL (ref 0.0–1.9)

## 2022-12-26 LAB — LIPID PANEL
Cholesterol: 119 mg/dL (ref 0–200)
HDL: 44.8 mg/dL (ref >39.00)
LDL Cholesterol: 60 mg/dL (ref 0–99)
NonHDL: 74.34
Total CHOL/HDL Ratio: 3
Triglycerides: 72 mg/dL (ref 0.0–149.0)
VLDL: 14.4 mg/dL (ref 0.0–40.0)

## 2022-12-26 LAB — TSH: TSH: 2.45 u[IU]/mL (ref 0.35–5.50)

## 2022-12-26 LAB — VITAMIN D 25 HYDROXY (VIT D DEFICIENCY, FRACTURES): VITD: 43.06 ng/mL (ref 30.00–100.00)

## 2023-01-16 DIAGNOSIS — G4733 Obstructive sleep apnea (adult) (pediatric): Secondary | ICD-10-CM | POA: Diagnosis not present

## 2023-01-23 IMAGING — MG MM DIGITAL SCREENING BILAT W/ TOMO AND CAD
8 of 14 series · 8 of 40 positions shown · non-contrast
Comparison: Previous exam(s).

CLINICAL DATA: Screening.

EXAM:
DIGITAL SCREENING BILATERAL MAMMOGRAM WITH TOMOSYNTHESIS AND CAD
TECHNIQUE: Bilateral screening digital craniocaudal and mediolateral oblique
mammograms were obtained. Bilateral screening digital breast
tomosynthesis was performed. The images were evaluated with
computer-aided detection.

[L MLO synth-2D (1 of 2)]
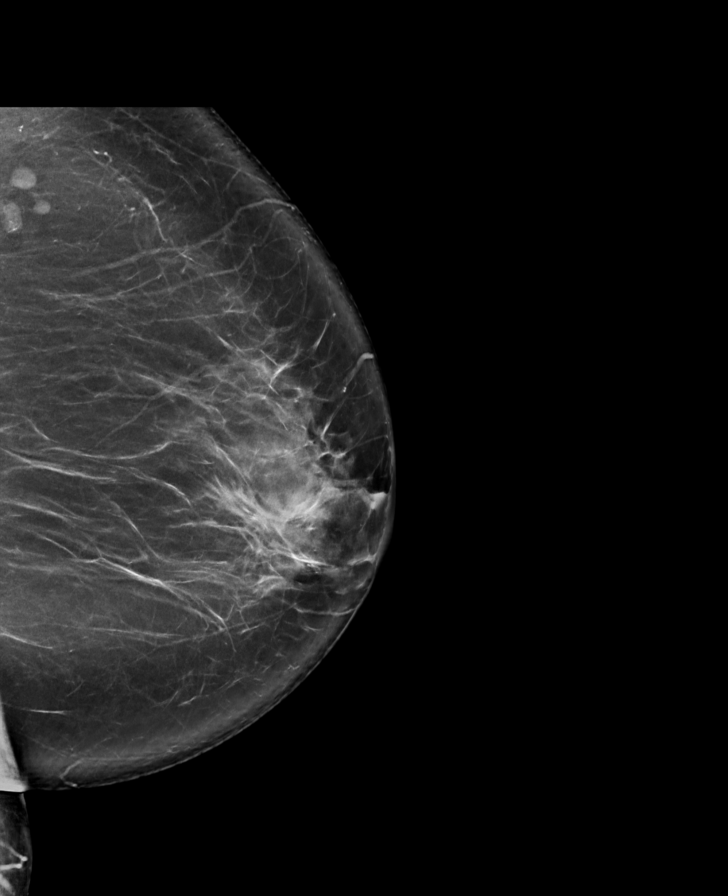

[L MLO synth-2D (2 of 2)]
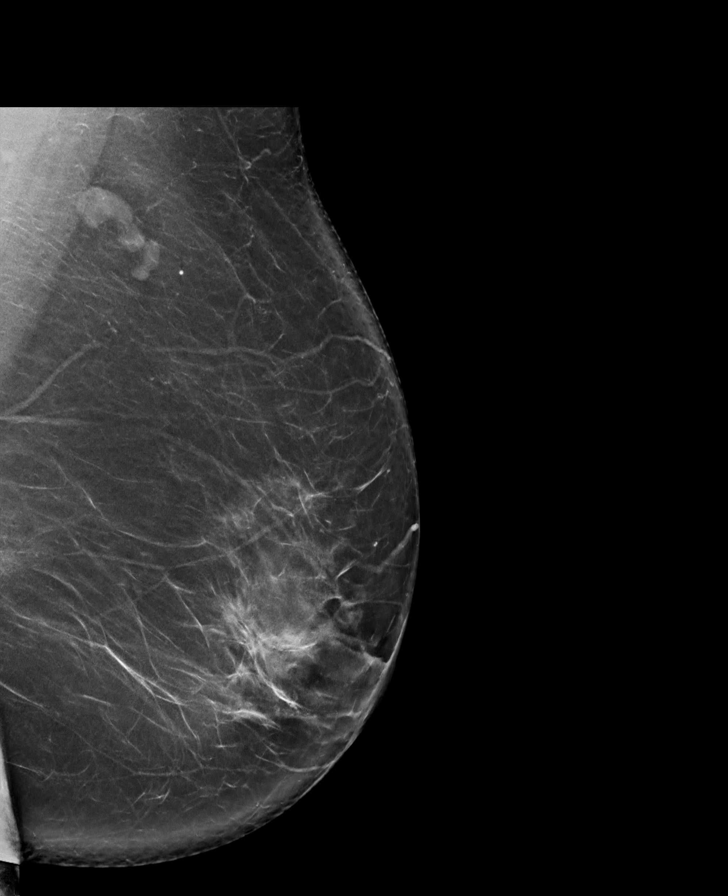

[R CC synth-2D]
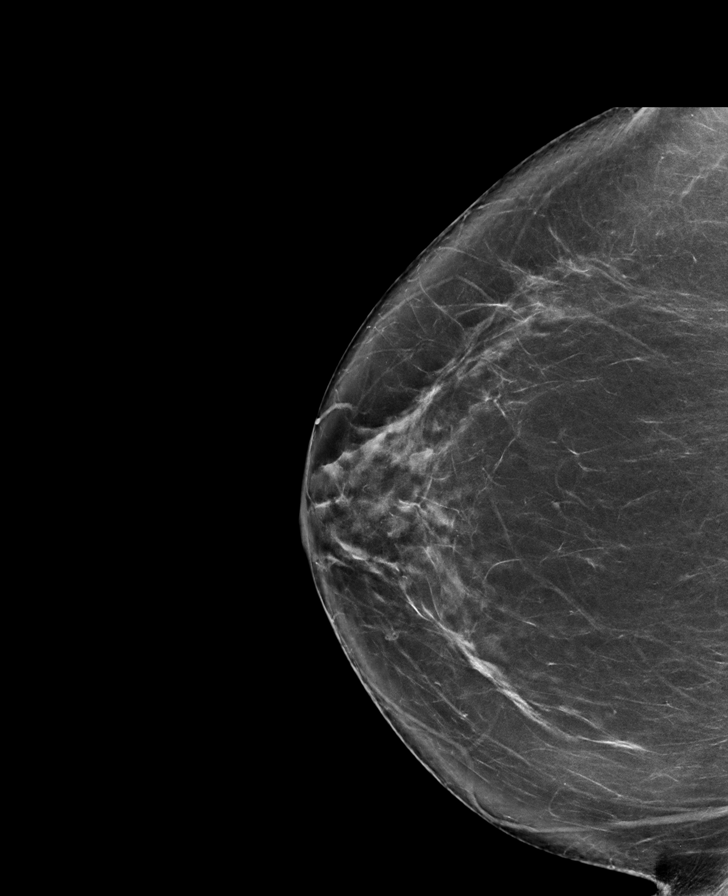

[R MLO synth-2D (1 of 2)]
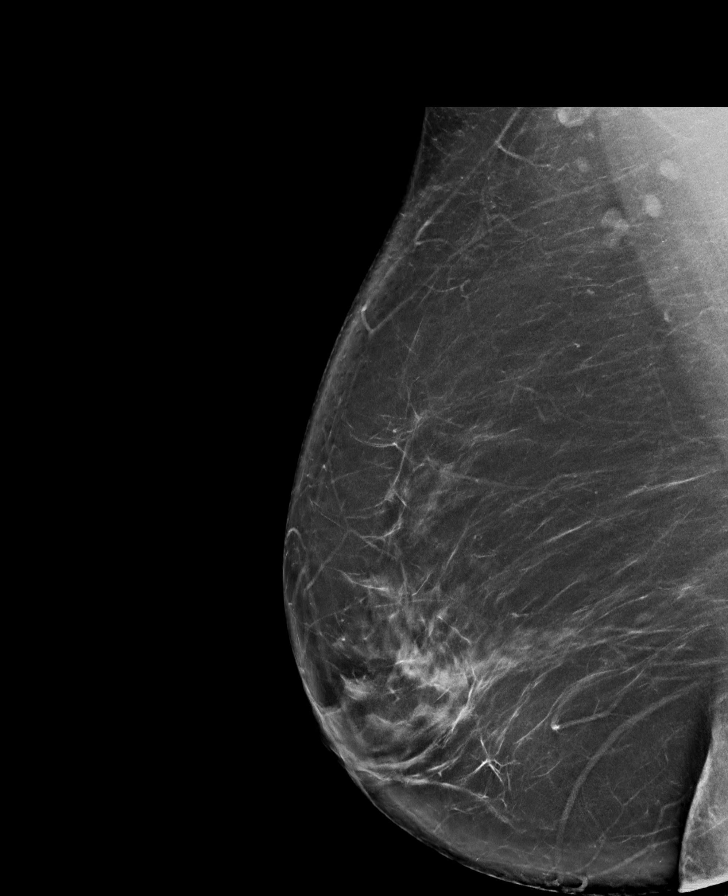

[R CV synth-2D]
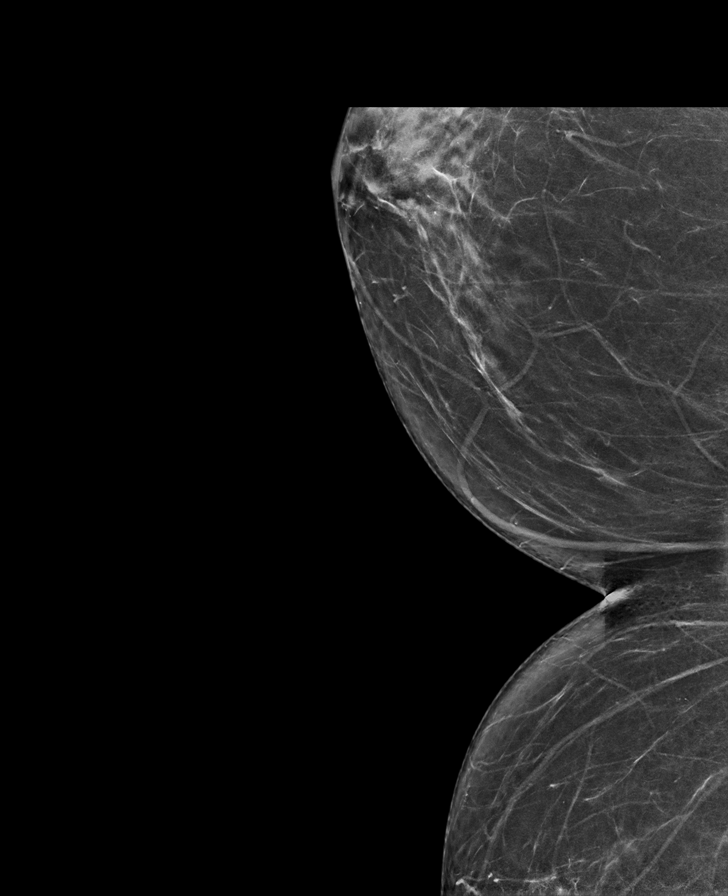

[R MLO synth-2D (2 of 2)]
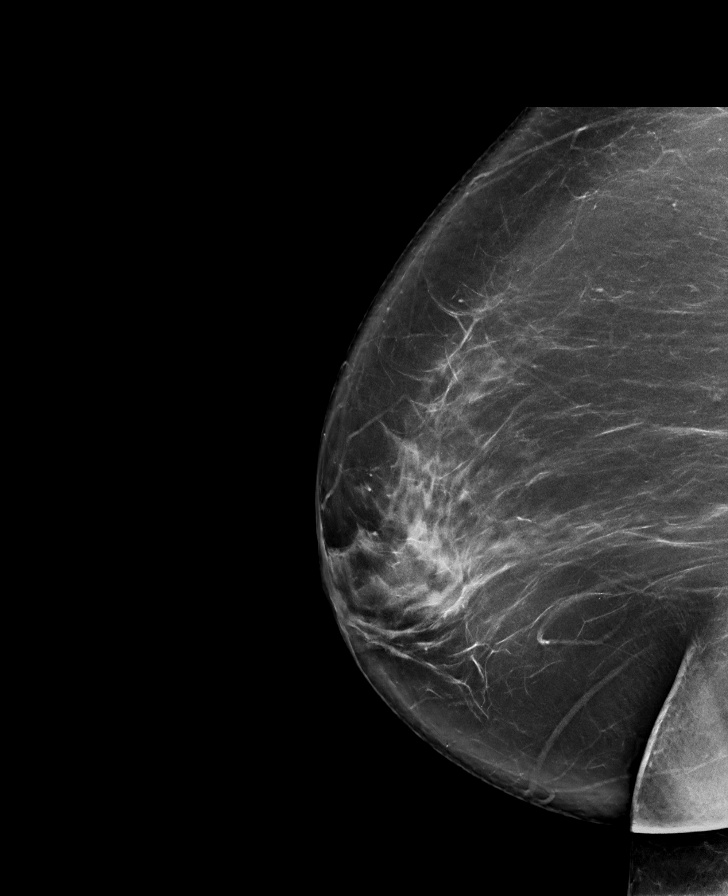

[L CC synth-2D]
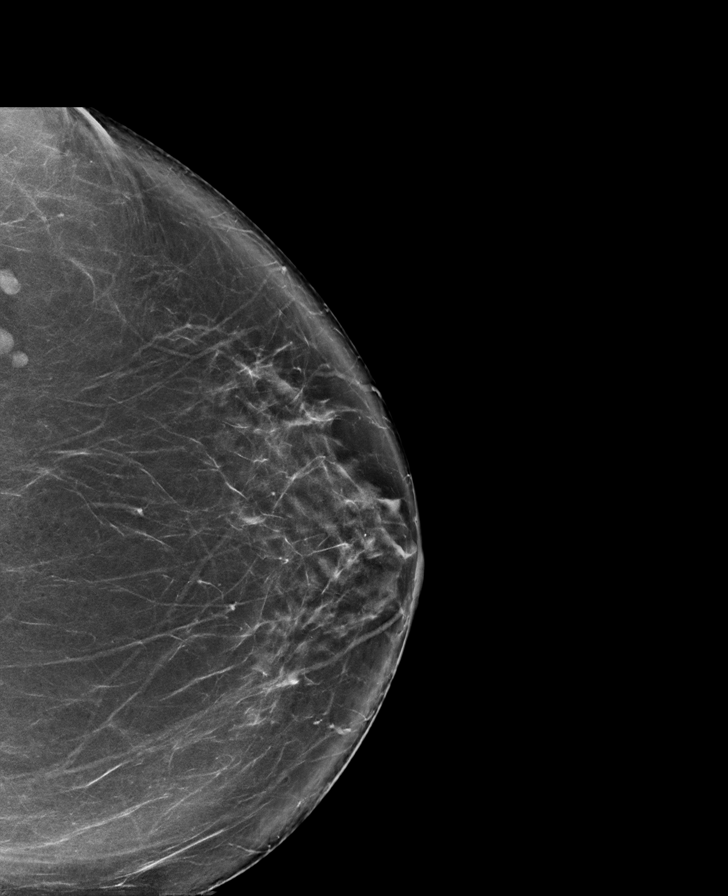

[R MLO tomo · tomo slice 51/100.0]
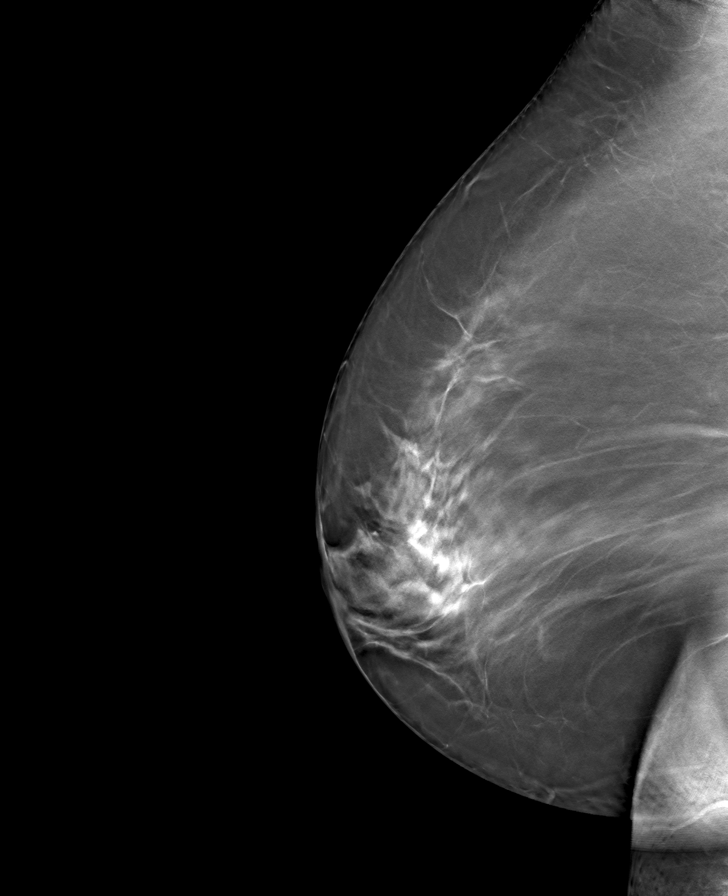

[8 of 40 positions shown; findings below may reference images not displayed]

ACR Breast Density Category b: There are scattered areas of
fibroglandular density.
FINDINGS: There are no findings suspicious for malignancy.
IMPRESSION: No mammographic evidence of malignancy. A result letter of this
screening mammogram will be mailed directly to the patient.

RECOMMENDATION:
Screening mammogram in one year. (Code:51-O-LD2)

BI-RADS CATEGORY  1: Negative.

## 2023-02-07 ENCOUNTER — Encounter (INDEPENDENT_AMBULATORY_CARE_PROVIDER_SITE_OTHER): Payer: Self-pay

## 2023-02-07 ENCOUNTER — Other Ambulatory Visit: Payer: Self-pay | Admitting: Internal Medicine

## 2023-02-18 ENCOUNTER — Telehealth: Payer: Self-pay

## 2023-02-18 NOTE — Patient Outreach (Signed)
  Care Coordination   02/18/2023 Name: [name] MRN: [id number] DOB: 05-22-1956   Care Coordination Outreach Attempts:  An unsuccessful telephone outreach was attempted today to offer the patient information about available care coordination services.  Follow Up Plan:  Additional outreach attempts will be made to offer the patient care coordination information and services.   Encounter Outcome:  No Answer   Care Coordination Interventions:  No, not indicated    Kathyrn Sheriff [facility] RN Care Management Coordinator Direct Dial: [phone]

## 2023-03-04 ENCOUNTER — Ambulatory Visit (INDEPENDENT_AMBULATORY_CARE_PROVIDER_SITE_OTHER): Payer: Medicare Other

## 2023-03-04 VITALS — Ht 64.0 in | Wt 284.0 lb

## 2023-03-04 DIAGNOSIS — Z0000 Encounter for general adult medical examination without abnormal findings: Secondary | ICD-10-CM | POA: Diagnosis not present

## 2023-03-04 NOTE — Progress Notes (Signed)
Subjective:   [name] is a 67 y.o. female who presents for Medicare Annual (Subsequent) preventive examination.  Visit Complete: Virtual  I connected with  [name] on 03/04/23 by a audio enabled telemedicine application and verified that I am speaking with the correct person using two identifiers.  Patient Location: Home  Provider Location: Office/Clinic  I discussed the limitations of evaluation and management by telemedicine. The patient expressed understanding and agreed to proceed.  Vital Signs: Because this visit was a virtual/telehealth visit, some criteria may be missing or patient reported. Any vitals not documented were not able to be obtained and vitals that have been documented are patient reported.  PATIENT PROVIDED WEIGHT FOR THIS VISIT.   Review of Systems     Cardiac Risk Factors include: advanced age (>61men, >42 women);dyslipidemia;family history of premature cardiovascular disease;hypertension;obesity (BMI >30kg/m2)     Objective:    Today's Vitals   03/04/23 1334  Weight: 284 lb (128.8 kg)  Height: 5\' 4"  (1.626 m)  PainSc: 0-No pain   Body mass index is 48.75 kg/m.     03/04/2023    1:36 PM 02/28/2022    4:15 PM 02/04/2020    4:14 PM 07/02/2018   12:30 PM 06/23/2018    3:06 PM 10/18/2017    4:24 PM 10/18/2017   11:11 AM  Advanced Directives  Does Patient Have a Medical Advance Directive? No No No No No No No  Would patient like information on creating a medical advance directive? No - Patient declined No - Patient declined No - Patient declined No - Patient declined No - Patient declined No - Patient declined No - Patient declined    Current Medications (verified) Outpatient Encounter Medications as of 03/04/2023  Medication Sig   amLODipine (NORVASC) 5 MG tablet TAKE 1 TABLET BY MOUTH DAILY   aspirin 81 MG tablet Take 81 mg by mouth daily.   Biotin 5000 MCG CAPS Take 1 capsule by mouth daily.    Cholecalciferol (VITAMIN D-3) 1000 UNITS  CAPS Take 1 capsule by mouth daily.   hydrochlorothiazide (HYDRODIURIL) 12.5 MG tablet TAKE 1 TABLET BY MOUTH DAILY   levocetirizine (XYZAL) 5 MG tablet TAKE 1 TABLET BY MOUTH DAILY IN  THE EVENING   metoprolol succinate (TOPROL-XL) 100 MG 24 hr tablet TAKE 1 TABLET BY MOUTH DAILY  WITH OR IMMEDIATELY FOLLOWING A  MEAL   rosuvastatin (CRESTOR) 5 MG tablet TAKE 1 TABLET BY MOUTH DAILY   No facility-administered encounter medications on file as of 03/04/2023.    Allergies (verified) Benazepril hcl   History: Past Medical History:  Diagnosis Date   Alopecia    Diabetes mellitus without complication (HCC)    Herpes zoster 1992   posterior thorax   Hyperlipidemia 2006    LDL 104, HDL 50   Hypertension    OA (osteoarthritis) of knee    Bilateral   Other abnormal glucose    Ovarian cyst    Small   Pre-diabetes    Uterine fibroid    Vitamin D deficiency    Past Surgical History:  Procedure Laterality Date   COLONOSCOPY  2007, 07/25/2016   negative   CYSTECTOMY     peri rectal   FOOT SURGERY     bilaterally   INCONTINENCE SURGERY  2005   Dr Edward Jolly   LIPOMA EXCISION  2011    L chest , Dr Corliss Skains   SHOULDER ARTHROSCOPY WITH ROTATOR CUFF REPAIR AND SUBACROMIAL DECOMPRESSION Right 10/18/2017   Procedure: Right  shoulder arthroscopy, subacromial decompression, labrial debridement mini open rotator cuff repair;  Surgeon: Jene Every, MD;  Location: [facility];  Service: Orthopedics;  Laterality: Right;  120 mins   SHOULDER ARTHROSCOPY WITH ROTATOR CUFF REPAIR AND SUBACROMIAL DECOMPRESSION Left 07/02/2018   Procedure: Left shoulder arthroscopy, subacromial decompression, mini open rotator cuff repair,;  Surgeon: Jene Every, MD;  Location: [facility];  Service: Orthopedics;  Laterality: Left;  90 mins   Family History  Problem Relation Age of Onset   Diabetes Mother    Stroke Father 64   Hypertension Father    Hypertension Sister    Hypertension Maternal Aunt    Diabetes Maternal Aunt     Cancer Neg Hx    Colon cancer Neg Hx    Breast cancer Neg Hx    Social History   Socioeconomic History   Marital status: Single    Spouse name: Not on file   Number of children: Not on file   Years of education: Not on file   Highest education level: Not on file  Occupational History   Not on file  Tobacco Use   Smoking status: Former    Current packs/day: 0.00    Types: Cigarettes    Start date: 06/25/1974    Quit date: 06/25/1980    Years since quitting: 42.7   Smokeless tobacco: Never   Tobacco comments:    smoked [age] , up to 1/2 ppd  Vaping Use   Vaping status: Never Used  Substance and Sexual Activity   Alcohol use: Not Currently   Drug use: No   Sexual activity: Yes    Birth control/protection: Post-menopausal  Other Topics Concern   Not on file  Social History Narrative   Lives alone    Left handed   Caffeine: 1 cup/day at most    Was exercising 5 days/week but fell 1 month ago and has been out of the gym    Social Determinants of Health   Financial Resource Strain: Low Risk  (03/04/2023)   Overall Financial Resource Strain (CARDIA)    Difficulty of Paying Living Expenses: Not hard at all  Food Insecurity: No Food Insecurity (03/04/2023)   Hunger Vital Sign    Worried About Running Out of Food in the Last Year: Never true    Ran Out of Food in the Last Year: Never true  Transportation Needs: No Transportation Needs (03/04/2023)   PRAPARE - Administrator, Civil Service (Medical): No    Lack of Transportation (Non-Medical): No  Physical Activity: Sufficiently Active (03/04/2023)   Exercise Vital Sign    Days of Exercise per Week: 4 days    Minutes of Exercise per Session: 60 min  Stress: No Stress Concern Present (03/04/2023)   [employer] of Occupational Health - Occupational Stress Questionnaire    Feeling of Stress : Not at all  Social Connections: Socially Integrated (03/04/2023)   Social Connection and Isolation Panel [NHANES]    Frequency of  Communication with Friends and Family: More than three times a week    Frequency of Social Gatherings with Friends and Family: More than three times a week    Attends Religious Services: More than 4 times per year    Active Member of [employer] or Organizations: Yes    Attends Engineer, structural: More than 4 times per year    Marital Status: Married    Tobacco Counseling Counseling given: Not Answered Tobacco comments: smoked [age] , up to 1/2 ppd  Clinical Intake:  Pre-visit preparation completed: Yes  Pain : No/denies pain Pain Score: 0-No pain     BMI - recorded: 48.75 Nutritional Status: BMI > 30  Obese Nutritional Risks: None Diabetes: No (PRE-DIABETIC) CBG done?: No Did pt. bring in CBG monitor from home?: No  How often do you need to have someone help you when you read instructions, pamphlets, or other written materials from your doctor or pharmacy?: 1 - Never What is the last grade level you completed in school?: HSG  Interpreter Needed?: No  Information entered by :: Demitra Danley N. Gloriann Riede, LPN.   Activities of Daily Living    03/04/2023    1:46 PM  In your present state of health, do you have any difficulty performing the following activities:  Hearing? 0  Vision? 0  Difficulty concentrating or making decisions? 0  Walking or climbing stairs? 0  Dressing or bathing? 0  Doing errands, shopping? 0  Preparing Food and eating ? N  Using the Toilet? N  In the past six months, have you accidently leaked urine? N  Do you have problems with loss of bowel control? N  Managing your Medications? N  Managing your Finances? N  Housekeeping or managing your Housekeeping? N    Patient Care Team: Pincus Sanes, MD as PCP - General (Internal Medicine)  Indicate any recent Medical Services you may have received from other than [facility] providers in the past year (date may be approximate).     Assessment:   This is a routine wellness examination for  Nashiya.  Hearing/Vision screen Hearing Screening - Comments:: Patient denied any hearing difficulty.   No hearing aids.   Vision Screening - Comments:: Patient does wear corrective lenses/contacts.  Annual eye exam done by: [facility]-    Goals Addressed             This Visit's Progress    Patient Stated       MY GOAL IS TO BE 200 POUNDS.      Depression Screen    03/04/2023    1:37 PM 06/27/2022    2:22 PM 02/28/2022    4:40 PM 01/09/2022    4:12 PM 12/12/2021    2:16 PM 06/13/2021    3:56 PM 02/04/2020    4:14 PM  PHQ 2/9 Scores  PHQ - 2 Score 0 0 0 0 0 0 0  PHQ- 9 Score 0 0 0 0 0 0     Fall Risk    03/04/2023    1:36 PM 06/27/2022    2:21 PM 02/28/2022    4:41 PM 12/12/2021    2:16 PM 06/13/2021    3:34 PM  Fall Risk   Falls in the past year? 1 0 0 0 0  Number falls in past yr: 0 0 0  0  Injury with Fall? 0 0 0  0  Comment went to Urgent Care; no fractures just bruising      Risk for fall due to :  No Fall Risks No Fall Risks  No Fall Risks  Follow up Falls evaluation completed;Education provided;Falls prevention discussed Falls evaluation completed Falls prevention discussed  Falls evaluation completed    MEDICARE RISK AT HOME: Medicare Risk at Home Any stairs in or around the home?: No If so, are there any without handrails?: No Home free of loose throw rugs in walkways, pet beds, electrical cords, etc?: Yes Adequate lighting in your home to reduce risk of falls?: Yes Life alert?: No Use  of a cane, walker or w/c?: No Grab bars in the bathroom?: No Shower chair or bench in shower?: No Elevated toilet seat or a handicapped toilet?: No  TIMED UP AND GO:  Was the test performed?  No    Cognitive Function:        03/04/2023    1:38 PM 02/28/2022    4:42 PM  6CIT Screen  What Year? 0 points 0 points  What month? 0 points 0 points  What time? 0 points 0 points  Count back from 20 0 points 0 points  Months in reverse 0 points 0 points  Repeat phrase 0 points  0 points  Total Score 0 points 0 points    Immunizations Immunization History  Administered Date(s) Administered   Fluad Quad(high Dose 65+) 06/13/2021   Influenza Whole 03/24/2009   Influenza,inj,Quad PF,6+ Mos 06/08/2019, 06/01/2020   PFIZER Comirnaty(Gray Top)Covid-19 Tri-Sucrose Vaccine 03/20/2022   PFIZER(Purple Top)SARS-COV-2 Vaccination 08/23/2019, 09/16/2019   PNEUMOCOCCAL CONJUGATE-20 06/13/2021   Td 02/26/2008   Tdap 12/16/2018    TDAP status: Up to date  Flu Vaccine status: Due, Education has been provided regarding the importance of this vaccine. Advised may receive this vaccine at local pharmacy or Health Dept. Aware to provide a copy of the vaccination record if obtained from local pharmacy or Health Dept. Verbalized acceptance and understanding.  Pneumococcal vaccine status: Up to date  Covid-19 vaccine status: Completed vaccines  Qualifies for Shingles Vaccine? Yes   Zostavax completed No   Shingrix Completed?: No.    Education has been provided regarding the importance of this vaccine. Patient has been advised to call insurance company to determine out of pocket expense if they have not yet received this vaccine. Advised may also receive vaccine at local pharmacy or Health Dept. Verbalized acceptance and understanding.  Screening Tests Health Maintenance  Topic Date Due   INFLUENZA VACCINE  01/24/2023   COVID-19 Vaccine (4 - 2023-24 season) 02/24/2023   Zoster Vaccines- Shingrix (1 of 2) 03/28/2023 (Originally 04/01/1975)   FOOT EXAM  06/12/2023   HEMOGLOBIN A1C  06/28/2023   OPHTHALMOLOGY EXAM  07/26/2023   Diabetic kidney evaluation - eGFR measurement  12/26/2023   Diabetic kidney evaluation - Urine ACR  12/26/2023   Medicare Annual Wellness (AWV)  03/03/2024   MAMMOGRAM  03/12/2024   DEXA SCAN  02/09/2025   Colonoscopy  07/25/2026   DTaP/Tdap/Td (3 - Td or Tdap) 12/15/2028   Pneumonia Vaccine 9+ Years old  Completed   Hepatitis C Screening  Completed    HPV VACCINES  Aged Out    Health Maintenance  Health Maintenance Due  Topic Date Due   INFLUENZA VACCINE  01/24/2023   COVID-19 Vaccine (4 - 2023-24 season) 02/24/2023    Colorectal cancer screening: Type of screening: Colonoscopy. Completed 07/25/2016. Repeat every 10 years  Mammogram status: Completed 03/12/2022. Repeat every year  Bone Density status: Completed 02/09/2022. Results reflect: Bone density results: OSTEOPENIA. Repeat every 3 years.  Lung Cancer Screening: (Low Dose CT Chest recommended if Age 36-80 years, 20 pack-year currently smoking OR have quit w/in 15years.) does not qualify.   Lung Cancer Screening Referral: no  Additional Screening:  Hepatitis C Screening: does qualify; Completed 04/11/2016  Vision Screening: Recommended annual ophthalmology exams for early detection of glaucoma and other disorders of the eye. Is the patient up to date with their annual eye exam?  Yes  Who is the provider or what is the name of the office in which the patient  attends annual eye exams? [facility] If pt is not established with a provider, would they like to be referred to a provider to establish care? No .   Dental Screening: Recommended annual dental exams for proper oral hygiene  Diabetic Foot Exam: Diabetic Foot Exam: Completed 06/11/2022  Community Resource Referral / Chronic Care Management: CRR required this visit?  No   CCM required this visit?  No     Plan:     I have personally reviewed and noted the following in the patient's chart:   Medical and social history Use of alcohol, tobacco or illicit drugs  Current medications and supplements including opioid prescriptions. Patient is not currently taking opioid prescriptions. Functional ability and status Nutritional status Physical activity Advanced directives List of other physicians Hospitalizations, surgeries, and ER visits in previous 12 months Vitals Screenings to include cognitive, depression, and  falls Referrals and appointments  In addition, I have reviewed and discussed with patient certain preventive protocols, quality metrics, and best practice recommendations. A written personalized care plan for preventive services as well as general preventive health recommendations were provided to patient.     Mickeal Needy, LPN   01/29/5783   After Visit Summary: (Mail) Due to this being a telephonic visit, the after visit summary with patients personalized plan was offered to patient via mail   Nurse Notes: Normal cognitive status assessed by direct observation via telephone conversation by this Nurse Health Advisor. No abnormalities found.

## 2023-03-04 NOTE — Patient Instructions (Addendum)
[name] , Thank you for taking time to come for your Medicare Wellness Visit. I appreciate your ongoing commitment to your health goals. Please review the following plan we discussed and let me know if I can assist you in the future.   Referrals/Orders/Follow-Ups/Clinician Recommendations: No  This is a list of the screening recommended for you and due dates:  Health Maintenance  Topic Date Due   Flu Shot  01/24/2023   COVID-19 Vaccine (4 - 2023-24 season) 02/24/2023   Zoster (Shingles) Vaccine (1 of 2) 03/28/2023*   Complete foot exam   06/12/2023   Hemoglobin A1C  06/28/2023   Eye exam for diabetics  07/26/2023   Yearly kidney function blood test for diabetes  12/26/2023   Yearly kidney health urinalysis for diabetes  12/26/2023   Medicare Annual Wellness Visit  03/03/2024   Mammogram  03/12/2024   DEXA scan (bone density measurement)  02/09/2025   Colon Cancer Screening  07/25/2026   DTaP/Tdap/Td vaccine (3 - Td or Tdap) 12/15/2028   Pneumonia Vaccine  Completed   Hepatitis C Screening  Completed   HPV Vaccine  Aged Out  *Topic was postponed. The date shown is not the original due date.    Advanced directives: (Declined) Advance directive discussed with you today. Even though you declined this today, please call our office should you change your mind, and we can give you the proper paperwork for you to fill out.  Next Medicare Annual Wellness Visit scheduled for next year: Yes

## 2023-03-08 ENCOUNTER — Telehealth: Payer: Self-pay

## 2023-03-08 NOTE — Patient Outreach (Signed)
Care Coordination   03/08/2023 Name: Kiersta Mechling MRN: [id number] DOB: 1956/02/16   Care Coordination Outreach Attempts:  A second unsuccessful outreach was attempted today to offer the patient with information about available care coordination services.  Follow Up Plan:  Additional outreach attempts will be made to offer the patient care coordination information and services.   Encounter Outcome:  No Answer   Care Coordination Interventions:  No, not indicated    Kathyrn Sheriff, RN, MSN, BSN, CCM Care Management Coordinator [phone]

## 2023-03-15 ENCOUNTER — Telehealth: Payer: Self-pay

## 2023-03-15 NOTE — Patient Outreach (Signed)
Care Coordination   Initial Visit Note   03/15/2023 Name: [name] MRN: [id number] DOB: 10-24-1955  [name] is a 67 y.o. year old female who sees Burns, Bobette Mo, MD for primary care. I spoke with  [name] by phone today.  What matters to the patients health and wellness today?  RNCM called to discuss care management services. Ms. Dante denies and care management needs. AWV completed on 03/04/23. No care management needs identified at this time.   Goals Addressed             This Visit's Progress    COMPLETED: Care Coordination Activities       Interventions Today    Flowsheet Row Most Recent Value  Chronic Disease   Chronic disease during today's visit Other, Hypertension (HTN)  [Prediabetes]  General Interventions   General Interventions Discussed/Reviewed General Interventions Discussed, Doctor Visits, Durable Medical Equipment (DME)  [encouraged to contact RNCM or primary provider if care management needs in the future.]  Doctor Visits Discussed/Reviewed Doctor Visits Discussed  [upcoming appointments reviewed]  Durable Medical Equipment (DME) BP Cuff, Glucomoter  Education Interventions   Education Provided Provided Education  Provided Verbal Education On When to see the doctor, Medication, Other  [advsied to take medications as prescribed, attend provider appointments as scheduled, contact provider if health questions or concerns.]            SDOH assessments and interventions completed:  No recently completed. Patient denies any changes.  Care Coordination Interventions:  Yes, provided   Follow up plan: No further intervention required.   Encounter Outcome:  Patient Visit Completed   Kathyrn Sheriff, RN, MSN, BSN, CCM Care Management Coordinator [phone]

## 2023-04-15 ENCOUNTER — Other Ambulatory Visit: Payer: Self-pay | Admitting: Internal Medicine

## 2023-04-15 DIAGNOSIS — Z1231 Encounter for screening mammogram for malignant neoplasm of breast: Secondary | ICD-10-CM

## 2023-04-16 ENCOUNTER — Inpatient Hospital Stay
Admission: RE | Admit: 2023-04-16 | Discharge: 2023-04-16 | Discharge status: Home / Self Care | Payer: Medicare Other | Source: Ambulatory Visit | Attending: Internal Medicine

## 2023-04-16 DIAGNOSIS — Z1231 Encounter for screening mammogram for malignant neoplasm of breast: Secondary | ICD-10-CM | POA: Diagnosis not present

## 2023-06-13 ENCOUNTER — Other Ambulatory Visit: Payer: Self-pay | Admitting: Internal Medicine

## 2023-06-13 DIAGNOSIS — I10 Essential (primary) hypertension: Secondary | ICD-10-CM

## 2023-07-01 ENCOUNTER — Encounter: Payer: Self-pay | Admitting: Internal Medicine

## 2023-07-01 DIAGNOSIS — N183 Chronic kidney disease, stage 3 unspecified: Secondary | ICD-10-CM | POA: Insufficient documentation

## 2023-07-01 NOTE — Patient Instructions (Addendum)
      Blood work was ordered.       Medications changes include :   None     Return in about 6 months (around 12/30/2023) for Physical Exam.

## 2023-07-01 NOTE — Progress Notes (Signed)
 Subjective:    Patient ID: [name], female    DOB: 1955-09-07, 68 y.o.   MRN: [id number]     HPI [name] is here for follow up of her chronic medical problems.  No concerns - has not been exercising regularly for the past month - plans on gong back to the gym.  She has not been eating as healthy either.   Medications and allergies reviewed with patient and updated if appropriate.  Current Outpatient Medications on File Prior to Visit  Medication Sig Dispense Refill   amLODipine  (NORVASC ) 5 MG tablet TAKE 1 TABLET BY MOUTH DAILY 100 tablet 2   aspirin 81 MG tablet Take 81 mg by mouth daily.     Biotin  5000 MCG CAPS Take 1 capsule by mouth daily.      Cholecalciferol  (VITAMIN D -3) 1000 UNITS CAPS Take 1 capsule by mouth daily.     hydrochlorothiazide  (HYDRODIURIL ) 12.5 MG tablet TAKE 1 TABLET BY MOUTH DAILY 100 tablet 2   levocetirizine (XYZAL ) 5 MG tablet TAKE 1 TABLET BY MOUTH DAILY IN  THE EVENING 100 tablet 2   metoprolol  succinate (TOPROL -XL) 100 MG 24 hr tablet TAKE 1 TABLET BY MOUTH DAILY  WITH OR IMMEDIATELY FOLLOWING A  MEAL 100 tablet 2   rosuvastatin  (CRESTOR ) 5 MG tablet TAKE 1 TABLET BY MOUTH DAILY 100 tablet 2   No current facility-administered medications on file prior to visit.     Review of Systems  Constitutional:  Negative for fever.  Respiratory:  Negative for cough, shortness of breath and wheezing.   Cardiovascular:  Negative for chest pain, palpitations and leg swelling.  Neurological:  Negative for light-headedness and headaches.       Objective:   Vitals:   07/02/23 1414  BP: 122/76  Pulse: (!) 102  Temp: 98.3 F (36.8 C)  SpO2: 94%   BP Readings from Last 3 Encounters:  07/02/23 122/76  12/26/22 118/80  07/19/22 133/76   Wt Readings from Last 3 Encounters:  07/02/23 285 lb (129.3 kg)  03/04/23 284 lb (128.8 kg)  12/26/22 282 lb (127.9 kg)   Body mass index is 48.92 kg/m.    Physical Exam Constitutional:       General: She is not in acute distress.    Appearance: Normal appearance.  HENT:     Head: Normocephalic and atraumatic.  Eyes:     Conjunctiva/sclera: Conjunctivae normal.  Cardiovascular:     Rate and Rhythm: Normal rate and regular rhythm.     Heart sounds: Normal heart sounds.  Pulmonary:     Effort: Pulmonary effort is normal. No respiratory distress.     Breath sounds: Normal breath sounds. No wheezing.  Musculoskeletal:     Cervical back: Neck supple.     Right lower leg: No edema.     Left lower leg: No edema.  Lymphadenopathy:     Cervical: No cervical adenopathy.  Skin:    General: Skin is warm and dry.     Findings: No rash.  Neurological:     Mental Status: She is alert. Mental status is at baseline.  Psychiatric:        Mood and Affect: Mood normal.        Behavior: Behavior normal.        Lab Results  Component Value Date   WBC 8.1 12/26/2022   HGB 13.9 12/26/2022   HCT 43.1 12/26/2022   PLT 234.0 12/26/2022   GLUCOSE 110 (H) 12/26/2022  CHOL 119 12/26/2022   TRIG 72.0 12/26/2022   HDL 44.80 12/26/2022   LDLCALC 60 12/26/2022   ALT 13 12/26/2022   AST 21 12/26/2022   NA 139 12/26/2022   K 3.8 12/26/2022   CL 103 12/26/2022   CREATININE 1.11 12/26/2022   BUN 20 12/26/2022   CO2 31 12/26/2022   TSH 2.45 12/26/2022   HGBA1C 7.0 (H) 12/26/2022   MICROALBUR <0.7 12/26/2022     Assessment & Plan:    See Problem List for Assessment and Plan of chronic medical problems.

## 2023-07-02 ENCOUNTER — Ambulatory Visit (INDEPENDENT_AMBULATORY_CARE_PROVIDER_SITE_OTHER): Payer: Medicare Other | Admitting: Internal Medicine

## 2023-07-02 VITALS — BP 122/76 | HR 102 | Temp 98.3°F | Ht 64.0 in | Wt 285.0 lb

## 2023-07-02 DIAGNOSIS — E669 Obesity, unspecified: Secondary | ICD-10-CM

## 2023-07-02 DIAGNOSIS — E559 Vitamin D deficiency, unspecified: Secondary | ICD-10-CM | POA: Diagnosis not present

## 2023-07-02 DIAGNOSIS — I10 Essential (primary) hypertension: Secondary | ICD-10-CM | POA: Diagnosis not present

## 2023-07-02 DIAGNOSIS — E782 Mixed hyperlipidemia: Secondary | ICD-10-CM

## 2023-07-02 DIAGNOSIS — E119 Type 2 diabetes mellitus without complications: Secondary | ICD-10-CM | POA: Diagnosis not present

## 2023-07-02 DIAGNOSIS — N1831 Chronic kidney disease, stage 3a: Secondary | ICD-10-CM | POA: Diagnosis not present

## 2023-07-02 LAB — LIPID PANEL
Cholesterol: 120 mg/dL (ref 0–200)
HDL: 42.7 mg/dL (ref >39.00)
LDL Cholesterol: 64 mg/dL (ref 0–99)
NonHDL: 77.36
Total CHOL/HDL Ratio: 3
Triglycerides: 66 mg/dL (ref 0.0–149.0)
VLDL: 13.2 mg/dL (ref 0.0–40.0)

## 2023-07-02 LAB — COMPREHENSIVE METABOLIC PANEL
ALT: 11 U/L (ref 0–35)
AST: 16 U/L (ref 0–37)
Albumin: 4 g/dL (ref 3.5–5.2)
Alkaline Phosphatase: 84 U/L (ref 39–117)
BUN: 18 mg/dL (ref 6–23)
CO2: 30 meq/L (ref 19–32)
Calcium: 9.7 mg/dL (ref 8.4–10.5)
Chloride: 103 meq/L (ref 96–112)
Creatinine, Ser: 1 mg/dL (ref 0.40–1.20)
GFR: 58.4 mL/min — ABNORMAL LOW (ref >60.00)
Glucose, Bld: 98 mg/dL (ref 70–99)
Potassium: 4 meq/L (ref 3.5–5.1)
Sodium: 140 meq/L (ref 135–145)
Total Bilirubin: 0.4 mg/dL (ref 0.2–1.2)
Total Protein: 7.4 g/dL (ref 6.0–8.3)

## 2023-07-02 LAB — HEMOGLOBIN A1C: Hgb A1c MFr Bld: 7.2 % — ABNORMAL HIGH (ref 4.6–6.5)

## 2023-07-02 MED ORDER — RYBELSUS 3 MG PO TABS
3.0000 mg | ORAL_TABLET | Freq: Every day | ORAL | 0 refills | Status: DC
Start: 2023-07-02 — End: 2023-07-04

## 2023-07-02 NOTE — Addendum Note (Signed)
 Addended by: Pincus Sanes on: 07/02/2023 09:15 PM   Modules accepted: Orders

## 2023-07-02 NOTE — Assessment & Plan Note (Signed)
 Chronic  Lab Results  Component Value Date   HGBA1C 7.0 (H) 12/26/2022   Sugars controlled Check A1c Not currently on medication-likely will consider starting Rybelsus -would like to avoid injections and this will hopefully help with better sugar control and aid in weight loss efforts Stressed regular exercise, diabetic diet

## 2023-07-02 NOTE — Assessment & Plan Note (Signed)
 Chronic Taking vitamin D regularly

## 2023-07-02 NOTE — Assessment & Plan Note (Signed)
Chronic Regular exercise and healthy diet encouraged Check lipid panel  Continue Crestor 5 mg daily 

## 2023-07-02 NOTE — Assessment & Plan Note (Signed)
Chronic Blood pressure well controlled CMP Continue amlodipine 5 mg daily, HCTZ 12.5 mg daily, metoprolol XL 100 mg daily

## 2023-07-02 NOTE — Assessment & Plan Note (Addendum)
 Chronic Restart regular exercise Decrease portions, eat more healthy-she knows she does not eat as well as she should Will work on weight loss Will likely start medication to help with weight loss in addition to helping improve sugars

## 2023-07-02 NOTE — Assessment & Plan Note (Signed)
 Chronic GFR decreased on more than 1 occasion Blood pressure well-controlled Check A1c today-likely needs to go on medication Does not take NSAIDs Stressed increased water Encouraged weight loss

## 2023-07-03 NOTE — Progress Notes (Signed)
 PATIENT: [name] DOB: 11-07-1955  REASON FOR VISIT: follow up HISTORY FROM: patient  Chief Complaint  Patient presents with   Follow-up    Pt in 19, here alone  Pt is here for OSA on CPAP. Pt states she is doing well with CPAP. No concerns. ESS 4, FSS 20     HISTORY OF PRESENT ILLNESS: Today 07/03/23:  [name] is a 68 y.o. female with a history of OSA on CPAP  Returns today for follow-up.  Download is below.  She states that she does not like using the CPAP but understands importance of while she is using.  Denies any new issues.  Her download is below     07/19/22: [name] is a 68 y.o. female with a history of OSA on CPAP. Returns today for follow-up.  She reports that she does not like using the CPAP but she does try to use it consistently.  She states that since she is been on CPAP she has not noticed a difference in how she feels.  She questions whether the home sleep test is completely accurate since she has not noticed any changes in the way she feels.  Her download is below      07/19/21: [name] is a 68 year old female with a history of obstructive sleep apnea on CPAP.  She returns today for follow-up.  She reports that she does not like using the CPAP but understands the risk associated with untreated sleep apnea.  She did inquire about inspire however her BMI is elevated.  She is trying to work on weight loss.  She returns today for evaluation.   04/06/20: [name] is a 68 year old female with a history of obstructive sleep apnea on CPAP.  Her download indicates that she used her machine 29 out of 30 days for compliance of 97%.  She used her machine greater than 4 hours 28 days for compliance of 93%.  On average she uses her machine 7 hours and 3 minutes.  Her residual AHI is 1.5 on 6 to 18 cm of water  with EPR 2.  Leak in the 95th percentile is 8.6.  The patient states that she has tried to remain faithful to using her CPAP.  Although she has not  been able to tell a big difference.  She states that she never really had symptoms before being diagnosed.  She states that she is diabetic with hemoglobin A1c at 6.8.  She states that she has been trying to fix her diet.  Currently she only eats 1 big meal a day.  She returns today for an evaluation.    REVIEW OF SYSTEMS: Out of a complete 14 system review of symptoms, the patient complains only of the following symptoms, and all other reviewed systems are negative.  ESS 4   ALLERGIES: Allergies  Allergen Reactions   Benazepril Hcl     REACTION: angioedema of lip. She cannot take angiotensin receptor blockers because of this history.    HOME MEDICATIONS: Outpatient Medications Prior to Visit  Medication Sig Dispense Refill   amLODipine  (NORVASC ) 5 MG tablet TAKE 1 TABLET BY MOUTH DAILY 100 tablet 2   aspirin 81 MG tablet Take 81 mg by mouth daily.     Biotin  5000 MCG CAPS Take 1 capsule by mouth daily.      Cholecalciferol  (VITAMIN D -3) 1000 UNITS CAPS Take 1 capsule by mouth daily.     hydrochlorothiazide  (HYDRODIURIL ) 12.5 MG tablet TAKE 1 TABLET BY MOUTH  DAILY 100 tablet 2   levocetirizine (XYZAL ) 5 MG tablet TAKE 1 TABLET BY MOUTH DAILY IN  THE EVENING 100 tablet 2   metoprolol  succinate (TOPROL -XL) 100 MG 24 hr tablet TAKE 1 TABLET BY MOUTH DAILY  WITH OR IMMEDIATELY FOLLOWING A  MEAL 100 tablet 2   rosuvastatin  (CRESTOR ) 5 MG tablet TAKE 1 TABLET BY MOUTH DAILY 100 tablet 2   Semaglutide  (RYBELSUS ) 3 MG TABS Take 1 tablet (3 mg total) by mouth daily. 30 tablet 0   No facility-administered medications prior to visit.    PAST MEDICAL HISTORY: Past Medical History:  Diagnosis Date   Alopecia    Diabetes mellitus without complication (HCC)    Herpes zoster 1992   posterior thorax   Hyperlipidemia 2006    LDL 104, HDL 50   Hypertension    OA (osteoarthritis) of knee    Bilateral   Other abnormal glucose    Ovarian cyst    Small   Pre-diabetes    Uterine fibroid     Vitamin D  deficiency     PAST SURGICAL HISTORY: Past Surgical History:  Procedure Laterality Date   COLONOSCOPY  2007, 07/25/2016   negative   CYSTECTOMY     peri rectal   FOOT SURGERY     bilaterally   INCONTINENCE SURGERY  2005   Dr Nikki   LIPOMA EXCISION  2011    L chest , Dr Belinda   SHOULDER ARTHROSCOPY WITH ROTATOR CUFF REPAIR AND SUBACROMIAL DECOMPRESSION Right 10/18/2017   Procedure: Right shoulder arthroscopy, subacromial decompression, labrial debridement mini open rotator cuff repair;  Surgeon: Duwayne Purchase, MD;  Location: [facility];  Service: Orthopedics;  Laterality: Right;  120 mins   SHOULDER ARTHROSCOPY WITH ROTATOR CUFF REPAIR AND SUBACROMIAL DECOMPRESSION Left 07/02/2018   Procedure: Left shoulder arthroscopy, subacromial decompression, mini open rotator cuff repair,;  Surgeon: Duwayne Purchase, MD;  Location: [facility];  Service: Orthopedics;  Laterality: Left;  90 mins    FAMILY HISTORY: Family History  Problem Relation Age of Onset   Diabetes Mother    Stroke Father 106   Hypertension Father    Hypertension Sister    Hypertension Maternal Aunt    Diabetes Maternal Aunt    Cancer Neg Hx    Colon cancer Neg Hx    Breast cancer Neg Hx     SOCIAL HISTORY: Social History   Socioeconomic History   Marital status: Single    Spouse name: Not on file   Number of children: Not on file   Years of education: Not on file   Highest education level: GED or equivalent  Occupational History   Not on file  Tobacco Use   Smoking status: Former    Current packs/day: 0.00    Types: Cigarettes    Start date: 06/25/1974    Quit date: 06/25/1980    Years since quitting: 43.0   Smokeless tobacco: Never   Tobacco comments:    smoked 16-25 , up to 1/2 ppd  Vaping Use   Vaping status: Never Used  Substance and Sexual Activity   Alcohol use: Not Currently   Drug use: No   Sexual activity: Yes    Birth control/protection: Post-menopausal  Other Topics Concern   Not on file   Social History Narrative   Lives alone    Left handed   Caffeine: 1 cup/day at most    Was exercising 5 days/week but fell 1 month ago and has been out of the gym  Social Drivers of Corporate Investment Banker Strain: Low Risk  (06/28/2023)   Overall Financial Resource Strain (CARDIA)    Difficulty of Paying Living Expenses: Not hard at all  Food Insecurity: No Food Insecurity (06/28/2023)   Hunger Vital Sign    Worried About Running Out of Food in the Last Year: Never true    Ran Out of Food in the Last Year: Never true  Transportation Needs: No Transportation Needs (06/28/2023)   PRAPARE - Administrator, Civil Service (Medical): No    Lack of Transportation (Non-Medical): No  Physical Activity: Sufficiently Active (06/28/2023)   Exercise Vital Sign    Days of Exercise per Week: 4 days    Minutes of Exercise per Session: 60 min  Stress: No Stress Concern Present (06/28/2023)   [employer] of Occupational Health - Occupational Stress Questionnaire    Feeling of Stress : Not at all  Social Connections: Moderately Integrated (06/28/2023)   Social Connection and Isolation Panel [NHANES]    Frequency of Communication with Friends and Family: More than three times a week    Frequency of Social Gatherings with Friends and Family: More than three times a week    Attends Religious Services: More than 4 times per year    Active Member of [employer] or Organizations: Yes    Attends Banker Meetings: More than 4 times per year    Marital Status: Never married  Intimate Partner Violence: Not At Risk (03/04/2023)   Humiliation, Afraid, Rape, and Kick questionnaire    Fear of Current or Ex-Partner: No    Emotionally Abused: No    Physically Abused: No    Sexually Abused: No      PHYSICAL EXAM  Vitals:   07/04/23 1314  BP: 118/70  Pulse: 68  Weight: 284 lb (128.8 kg)  Height: 5' 4 (1.626 m)    Body mass index is 48.75 kg/m.  Generalized: Well developed, in  no acute distress  Chest: Lungs clear to auscultation bilaterally  Neurological examination  Mentation: Alert oriented to time, place, history taking. Follows all commands speech and language fluent Gait and station: Gait is normal.    DIAGNOSTIC DATA (LABS, IMAGING, TESTING) - I reviewed patient records, labs, notes, testing and imaging myself where available.  Lab Results  Component Value Date   WBC 8.1 12/26/2022   HGB 13.9 12/26/2022   HCT 43.1 12/26/2022   MCV 88.8 12/26/2022   PLT 234.0 12/26/2022      Component Value Date/Time   NA 140 07/02/2023 1510   K 4.0 07/02/2023 1510   CL 103 07/02/2023 1510   CO2 30 07/02/2023 1510   GLUCOSE 98 07/02/2023 1510   BUN 18 07/02/2023 1510   CREATININE 1.00 07/02/2023 1510   CREATININE 1.12 (H) 08/11/2020 1548   CALCIUM  9.7 07/02/2023 1510   PROT 7.4 07/02/2023 1510   ALBUMIN 4.0 07/02/2023 1510   AST 16 07/02/2023 1510   ALT 11 07/02/2023 1510   ALKPHOS 84 07/02/2023 1510   BILITOT 0.4 07/02/2023 1510   GFRNONAA 60 (L) 06/23/2018 1531   GFRAA >60 06/23/2018 1531   Lab Results  Component Value Date   CHOL 120 07/02/2023   HDL 42.70 07/02/2023   LDLCALC 64 07/02/2023   TRIG 66.0 07/02/2023   CHOLHDL 3 07/02/2023   Lab Results  Component Value Date   HGBA1C 7.2 (H) 07/02/2023   No results found for: [id number] Lab Results  Component Value Date  TSH 2.45 12/26/2022      ASSESSMENT AND PLAN 68 y.o. year old female  has a past medical history of Alopecia, Diabetes mellitus without complication (HCC), Herpes zoster (1992), Hyperlipidemia (2006), Hypertension, OA (osteoarthritis) of knee, Other abnormal glucose, Ovarian cyst, Pre-diabetes, Uterine fibroid, and Vitamin D  deficiency. here with:  OSA on CPAP  - CPAP compliance excellent - Good treatment of AHI  - Encourage patient to use CPAP nightly and > 4 hours each night - F/U in 1 year or sooner if needed    Duwaine Russell, MSN, NP-C 07/03/2023, 4:39  PM [facility] [address], [address] [place], [place] [address] [phone]

## 2023-07-04 ENCOUNTER — Encounter: Payer: Self-pay | Admitting: Adult Health

## 2023-07-04 ENCOUNTER — Telehealth: Payer: Self-pay

## 2023-07-04 ENCOUNTER — Ambulatory Visit: Payer: Medicare Other | Admitting: Adult Health

## 2023-07-04 VITALS — BP 118/70 | HR 68 | Ht 64.0 in | Wt 284.0 lb

## 2023-07-04 DIAGNOSIS — G4733 Obstructive sleep apnea (adult) (pediatric): Secondary | ICD-10-CM

## 2023-07-04 DIAGNOSIS — E119 Type 2 diabetes mellitus without complications: Secondary | ICD-10-CM

## 2023-07-04 MED ORDER — GLIMEPIRIDE 2 MG PO TABS: 2.0000 mg | ORAL_TABLET | Freq: Every day | ORAL | 5 refills | Status: DC

## 2023-07-04 NOTE — Patient Instructions (Signed)
 Continue using CPAP nightly and greater than 4 hours each night If your symptoms worsen or you develop new symptoms please let us know.

## 2023-07-04 NOTE — Addendum Note (Signed)
 Addended by: Pincus Sanes on: 07/04/2023 12:33 PM   Modules accepted: Orders

## 2023-07-04 NOTE — Telephone Encounter (Signed)
 Copied from CRM [id number]. Topic: Clinical - Prescription Issue >> Jul 04, 2023  9:08 AM Joanell B wrote:  Reason for CRM: Pt stated that she is needing another medication for her diabetes she said the pharmacy said the medication was 300$ and she is not able to afford that, she would like another medication that her insurance can cover.

## 2023-07-04 NOTE — Telephone Encounter (Signed)
 Glimepiride sent to local pharmacy.

## 2023-07-31 LAB — HM DIABETES EYE EXAM

## 2023-10-14 ENCOUNTER — Other Ambulatory Visit: Payer: Self-pay | Admitting: Internal Medicine

## 2023-10-14 MED ORDER — GLIMEPIRIDE 2 MG PO TABS: 2.0000 mg | ORAL_TABLET | Freq: Every day | ORAL | 5 refills | Status: DC

## 2023-10-14 NOTE — Telephone Encounter (Signed)
 Copied from CRM [id number]. Topic: Clinical - Medication Refill >> Oct 14, 2023 10:03 AM Adan Adas P wrote: Most Recent Primary Care Visit:  Provider: BURNS, Beckey Bourgeois  Department: [facility]  Visit Type: OFFICE VISIT  Date: 07/02/2023  Medication: glimepiride  (AMARYL ) 2 MG tablet  Has the patient contacted their pharmacy? Yes (Agent: If no, request that the patient contact the pharmacy for the refill. If patient does not wish to contact the pharmacy document the reason why and proceed with request.) (Agent: If yes, when and what did the pharmacy advise?)  Is this the correct pharmacy for this prescription? Yes If no, delete pharmacy and type the correct one.  This is the patient's preferred pharmacy:  [facility] [place], [place] - [address] [place] [address] Phone: [phone] Fax: [phone]   Has the prescription been filled recently? Yes  Is the patient out of the medication? No  Has the patient been seen for an appointment in the last year OR does the patient have an upcoming appointment? Yes  Can we respond through MyChart? Yes  Agent: Please be advised that Rx refills may take up to 3 business days. We ask that you follow-up with your pharmacy.

## 2023-12-23 ENCOUNTER — Other Ambulatory Visit: Payer: Self-pay | Admitting: Internal Medicine

## 2023-12-30 ENCOUNTER — Encounter: Payer: Medicare Other | Admitting: Internal Medicine

## 2024-02-08 ENCOUNTER — Other Ambulatory Visit: Payer: Self-pay | Admitting: Internal Medicine

## 2024-03-03 ENCOUNTER — Encounter: Payer: Self-pay | Admitting: Internal Medicine

## 2024-03-03 NOTE — Patient Instructions (Addendum)
 Flu immunization administered today.     Blood work was ordered.       Medications changes include :   None     Return in about 6 months (around 09/01/2024) for follow up.     Health Maintenance, Female Adopting a healthy lifestyle and getting preventive care are important in promoting health and wellness. Ask your health care provider about: The right schedule for you to have regular tests and exams. Things you can do on your own to prevent diseases and keep yourself healthy. What should I know about diet, weight, and exercise? Eat a healthy diet  Eat a diet that includes plenty of vegetables, fruits, low-fat dairy products, and lean protein. Do not eat a lot of foods that are high in solid fats, added sugars, or sodium. Maintain a healthy weight Body mass index (BMI) is used to identify weight problems. It estimates body fat based on height and weight. Your health care provider can help determine your BMI and help you achieve or maintain a healthy weight. Get regular exercise Get regular exercise. This is one of the most important things you can do for your health. Most adults should: Exercise for at least 150 minutes each week. The exercise should increase your heart rate and make you sweat (moderate-intensity exercise). Do strengthening exercises at least twice a week. This is in addition to the moderate-intensity exercise. Spend less time sitting. Even light physical activity can be beneficial. Watch cholesterol and blood lipids Have your blood tested for lipids and cholesterol at 68 years of age, then have this test every 5 years. Have your cholesterol levels checked more often if: Your lipid or cholesterol levels are high. You are older than 68 years of age. You are at high risk for heart disease. What should I know about cancer screening? Depending on your health history and family history, you may need to have cancer screening at various ages. This may include  screening for: Breast cancer. Cervical cancer. Colorectal cancer. Skin cancer. Lung cancer. What should I know about heart disease, diabetes, and high blood pressure? Blood pressure and heart disease High blood pressure causes heart disease and increases the risk of stroke. This is more likely to develop in people who have high blood pressure readings or are overweight. Have your blood pressure checked: Every 3-5 years if you are 49-48 years of age. Every year if you are 61 years old or older. Diabetes Have regular diabetes screenings. This checks your fasting blood sugar level. Have the screening done: Once every three years after age 57 if you are at a normal weight and have a low risk for diabetes. More often and at a younger age if you are overweight or have a high risk for diabetes. What should I know about preventing infection? Hepatitis B If you have a higher risk for hepatitis B, you should be screened for this virus. Talk with your health care provider to find out if you are at risk for hepatitis B infection. Hepatitis C Testing is recommended for: Everyone born from [birth year] through 1965. Anyone with known risk factors for hepatitis C. Sexually transmitted infections (STIs) Get screened for STIs, including gonorrhea and chlamydia, if: You are sexually active and are younger than 68 years of age. You are older than 68 years of age and your health care provider tells you that you are at risk for this type of infection. Your sexual activity has changed since you were last screened, and you are  at increased risk for chlamydia or gonorrhea. Ask your health care provider if you are at risk. Ask your health care provider about whether you are at high risk for HIV. Your health care provider may recommend a prescription medicine to help prevent HIV infection. If you choose to take medicine to prevent HIV, you should first get tested for HIV. You should then be tested every 3 months for as  long as you are taking the medicine. Pregnancy If you are about to stop having your period (premenopausal) and you may become pregnant, seek counseling before you get pregnant. Take 400 to 800 micrograms (mcg) of folic acid every day if you become pregnant. Ask for birth control (contraception) if you want to prevent pregnancy. Osteoporosis and menopause Osteoporosis is a disease in which the bones lose minerals and strength with aging. This can result in bone fractures. If you are 87 years old or older, or if you are at risk for osteoporosis and fractures, ask your health care provider if you should: Be screened for bone loss. Take a calcium  or vitamin D  supplement to lower your risk of fractures. Be given hormone replacement therapy (HRT) to treat symptoms of menopause. Follow these instructions at home: Alcohol use Do not drink alcohol if: Your health care provider tells you not to drink. You are pregnant, may be pregnant, or are planning to become pregnant. If you drink alcohol: Limit how much you have to: 0-1 drink a day. Know how much alcohol is in your drink. In the [place] one drink equals one 12 oz bottle of beer (355 mL), one 5 oz glass of wine (148 mL), or one 1 oz glass of hard liquor (44 mL). Lifestyle Do not use any products that contain nicotine or tobacco. These products include cigarettes, chewing tobacco, and vaping devices, such as e-cigarettes. If you need help quitting, ask your health care provider. Do not use street drugs. Do not share needles. Ask your health care provider for help if you need support or information about quitting drugs. General instructions Schedule regular health, dental, and eye exams. Stay current with your vaccines. Tell your health care provider if: You often feel depressed. You have ever been abused or do not feel safe at home. Summary Adopting a healthy lifestyle and getting preventive care are important in promoting health and  wellness. Follow your health care provider's instructions about healthy diet, exercising, and getting tested or screened for diseases. Follow your health care provider's instructions on monitoring your cholesterol and blood pressure. This information is not intended to replace advice given to you by your health care provider. Make sure you discuss any questions you have with your health care provider. Document Revised: 10/31/2020 Document Reviewed: 10/31/2020 Elsevier Patient Education  2024 [employer].

## 2024-03-03 NOTE — Progress Notes (Unsigned)
 Subjective:    Patient ID: [name], female    DOB: Mar 24, 1956, 68 y.o.   MRN: [id number]      HPI [name] is here for a Physical exam and her chronic medical problems.   Overall doing well but does not have motivation to do things that she should do - like cleaning the house.  She does things she wants to do and she really has to do.  She is not sure why.  No depression.     Medications and allergies reviewed with patient and updated if appropriate.  Current Outpatient Medications on File Prior to Visit  Medication Sig Dispense Refill   amLODipine  (NORVASC ) 5 MG tablet TAKE 1 TABLET BY MOUTH DAILY 100 tablet 2   aspirin 81 MG tablet Take 81 mg by mouth daily.     Biotin  5000 MCG CAPS Take 1 capsule by mouth daily.      Cholecalciferol  (VITAMIN D -3) 1000 UNITS CAPS Take 1 capsule by mouth daily.     glimepiride  (AMARYL ) 2 MG tablet TAKE 1 TABLET BY MOUTH DAILY  BEFORE BREAKFAST 80 tablet 3   hydrochlorothiazide  (HYDRODIURIL ) 12.5 MG tablet TAKE 1 TABLET BY MOUTH DAILY 100 tablet 2   levocetirizine (XYZAL ) 5 MG tablet TAKE 1 TABLET BY MOUTH DAILY IN  THE EVENING 100 tablet 2   metoprolol  succinate (TOPROL -XL) 100 MG 24 hr tablet TAKE 1 TABLET BY MOUTH DAILY  WITH OR IMMEDIATELY FOLLOWING A  MEAL 100 tablet 2   rosuvastatin  (CRESTOR ) 5 MG tablet TAKE 1 TABLET BY MOUTH DAILY 100 tablet 2   No current facility-administered medications on file prior to visit.    Review of Systems  Constitutional:  Positive for fatigue. Negative for fever.  Eyes:  Negative for visual disturbance.  Respiratory:  Positive for cough (dry - likely related to allergies) and shortness of breath (strenuous acitivty). Negative for wheezing.   Cardiovascular:  Positive for leg swelling (feet occ). Negative for chest pain and palpitations.  Gastrointestinal:  Negative for abdominal pain, blood in stool, constipation and diarrhea.       No gerd  Genitourinary:  Negative for dysuria.  Musculoskeletal:   Positive for arthralgias (right knee). Negative for back pain.  Skin:  Negative for rash.  Neurological:  Negative for dizziness, light-headedness, numbness and headaches.  Psychiatric/Behavioral:  Negative for dysphoric mood. The patient is not nervous/anxious.        Objective:   Vitals:   03/04/24 1414  BP: 116/80  Pulse: 63  Temp: 98 F (36.7 C)  SpO2: 97%   Filed Weights   03/04/24 1414  Weight: 279 lb (126.6 kg)   Body mass index is 47.89 kg/m.  BP Readings from Last 3 Encounters:  03/04/24 116/80  07/04/23 118/70  07/02/23 122/76    Wt Readings from Last 3 Encounters:  03/04/24 279 lb (126.6 kg)  03/04/24 284 lb (128.8 kg)  07/04/23 284 lb (128.8 kg)       Physical Exam Constitutional: She appears well-developed and well-nourished. No distress.  HENT:  Head: Normocephalic and atraumatic.  Right Ear: External ear normal. Normal ear canal and TM Left Ear: External ear normal.  Normal ear canal and TM Mouth/Throat: Oropharynx is clear and moist.  Eyes: Conjunctivae normal.  Neck: Neck supple. No tracheal deviation present. No thyromegaly present.  No carotid bruit  Cardiovascular: Normal rate, regular rhythm and normal heart sounds.   No murmur heard.  No edema. Pulmonary/Chest: Effort normal and breath sounds normal.  No respiratory distress. She has no wheezes. She has no rales.  Breast: deferred   Abdominal: Soft. She exhibits no distension. There is no tenderness.  Lymphadenopathy: She has no cervical adenopathy.  Skin: Skin is warm and dry. She is not diaphoretic.  Psychiatric: She has a normal mood and affect. Her behavior is normal.     Lab Results  Component Value Date   WBC 8.1 12/26/2022   HGB 13.9 12/26/2022   HCT 43.1 12/26/2022   PLT 234.0 12/26/2022   GLUCOSE 98 07/02/2023   CHOL 120 07/02/2023   TRIG 66.0 07/02/2023   HDL 42.70 07/02/2023   LDLCALC 64 07/02/2023   ALT 11 07/02/2023   AST 16 07/02/2023   NA 140 07/02/2023   K  4.0 07/02/2023   CL 103 07/02/2023   CREATININE 1.00 07/02/2023   BUN 18 07/02/2023   CO2 30 07/02/2023   TSH 2.45 12/26/2022   HGBA1C 7.2 (H) 07/02/2023   MICROALBUR 0.7 06/11/2006         Assessment & Plan:   Physical exam: Screening blood work  ordered Exercise  not regular-stressed regular exercise Weight  obese Substance abuse  none   Reviewed recommended immunizations.  Flu immunization administered today.     Health Maintenance  Topic Date Due   Zoster Vaccines- Shingrix (1 of 2) Never done   Diabetic kidney evaluation - Urine ACR  06/12/2007   FOOT EXAM  06/12/2023   HEMOGLOBIN A1C  12/30/2023   Influenza Vaccine  01/24/2024   COVID-19 Vaccine (5 - 2024-25 season) 02/24/2024   Diabetic kidney evaluation - eGFR measurement  07/01/2024   OPHTHALMOLOGY EXAM  07/30/2024   DEXA SCAN  02/09/2025   Medicare Annual Wellness (AWV)  03/04/2025   MAMMOGRAM  04/15/2025   Colonoscopy  07/25/2026   DTaP/Tdap/Td (3 - Td or Tdap) 12/15/2028   Pneumococcal Vaccine: 50+ Years  Completed   Hepatitis C Screening  Completed   HPV VACCINES  Aged Out   Meningococcal B Vaccine  Aged Out          See Problem List for Assessment and Plan of chronic medical problems.

## 2024-03-04 ENCOUNTER — Ambulatory Visit (INDEPENDENT_AMBULATORY_CARE_PROVIDER_SITE_OTHER): Payer: Medicare Other

## 2024-03-04 ENCOUNTER — Other Ambulatory Visit: Payer: Self-pay | Admitting: Internal Medicine

## 2024-03-04 ENCOUNTER — Ambulatory Visit (INDEPENDENT_AMBULATORY_CARE_PROVIDER_SITE_OTHER): Admitting: Internal Medicine

## 2024-03-04 VITALS — Ht 64.0 in | Wt 284.0 lb

## 2024-03-04 VITALS — BP 116/80 | HR 63 | Temp 98.0°F | Ht 64.0 in | Wt 279.0 lb

## 2024-03-04 DIAGNOSIS — G4733 Obstructive sleep apnea (adult) (pediatric): Secondary | ICD-10-CM | POA: Diagnosis not present

## 2024-03-04 DIAGNOSIS — N1831 Chronic kidney disease, stage 3a: Secondary | ICD-10-CM

## 2024-03-04 DIAGNOSIS — M8588 Other specified disorders of bone density and structure, other site: Secondary | ICD-10-CM

## 2024-03-04 DIAGNOSIS — E1169 Type 2 diabetes mellitus with other specified complication: Secondary | ICD-10-CM | POA: Diagnosis not present

## 2024-03-04 DIAGNOSIS — E785 Hyperlipidemia, unspecified: Secondary | ICD-10-CM

## 2024-03-04 DIAGNOSIS — E662 Morbid (severe) obesity with alveolar hypoventilation: Secondary | ICD-10-CM

## 2024-03-04 DIAGNOSIS — I152 Hypertension secondary to endocrine disorders: Secondary | ICD-10-CM

## 2024-03-04 DIAGNOSIS — E1122 Type 2 diabetes mellitus with diabetic chronic kidney disease: Secondary | ICD-10-CM

## 2024-03-04 DIAGNOSIS — Z6841 Body Mass Index (BMI) 40.0 and over, adult: Secondary | ICD-10-CM

## 2024-03-04 DIAGNOSIS — Z0000 Encounter for general adult medical examination without abnormal findings: Secondary | ICD-10-CM | POA: Diagnosis not present

## 2024-03-04 DIAGNOSIS — E559 Vitamin D deficiency, unspecified: Secondary | ICD-10-CM | POA: Diagnosis not present

## 2024-03-04 DIAGNOSIS — Z1231 Encounter for screening mammogram for malignant neoplasm of breast: Secondary | ICD-10-CM

## 2024-03-04 DIAGNOSIS — E1159 Type 2 diabetes mellitus with other circulatory complications: Secondary | ICD-10-CM | POA: Diagnosis not present

## 2024-03-04 DIAGNOSIS — E66813 Obesity, class 3: Secondary | ICD-10-CM

## 2024-03-04 LAB — COMPREHENSIVE METABOLIC PANEL WITH GFR
ALT: 14 U/L (ref 0–35)
AST: 20 U/L (ref 0–37)
Albumin: 4 g/dL (ref 3.5–5.2)
Alkaline Phosphatase: 79 U/L (ref 39–117)
BUN: 17 mg/dL (ref 6–23)
CO2: 30 meq/L (ref 19–32)
Calcium: 9.6 mg/dL (ref 8.4–10.5)
Chloride: 103 meq/L (ref 96–112)
Creatinine, Ser: 0.97 mg/dL (ref 0.40–1.20)
GFR: 60.29 mL/min (ref >60.00)
Glucose, Bld: 85 mg/dL (ref 70–99)
Potassium: 3.7 meq/L (ref 3.5–5.1)
Sodium: 139 meq/L (ref 135–145)
Total Bilirubin: 0.5 mg/dL (ref 0.2–1.2)
Total Protein: 7.6 g/dL (ref 6.0–8.3)

## 2024-03-04 LAB — CBC WITH DIFFERENTIAL/PLATELET
Basophils Absolute: 0 K/uL (ref 0.0–0.1)
Basophils Relative: 0.5 % (ref 0.0–3.0)
Eosinophils Absolute: 0.1 K/uL (ref 0.0–0.7)
Eosinophils Relative: 1.7 % (ref 0.0–5.0)
HCT: 41.1 % (ref 36.0–46.0)
Hemoglobin: 13.1 g/dL (ref 12.0–15.0)
Lymphocytes Relative: 29.4 % (ref 12.0–46.0)
Lymphs Abs: 2.3 K/uL (ref 0.7–4.0)
MCHC: 31.9 g/dL (ref 30.0–36.0)
MCV: 88.4 fl (ref 78.0–100.0)
Monocytes Absolute: 0.7 K/uL (ref 0.1–1.0)
Monocytes Relative: 8.5 % (ref 3.0–12.0)
Neutro Abs: 4.7 K/uL (ref 1.4–7.7)
Neutrophils Relative %: 59.9 % (ref 43.0–77.0)
Platelets: 249 K/uL (ref 150.0–400.0)
RBC: 4.65 Mil/uL (ref 3.87–5.11)
RDW: 14.5 % (ref 11.5–15.5)
WBC: 7.9 K/uL (ref 4.0–10.5)

## 2024-03-04 LAB — HEMOGLOBIN A1C: Hgb A1c MFr Bld: 6.7 % — ABNORMAL HIGH (ref 4.6–6.5)

## 2024-03-04 LAB — LIPID PANEL
Cholesterol: 122 mg/dL (ref 0–200)
HDL: 46.1 mg/dL (ref >39.00)
LDL Cholesterol: 63 mg/dL (ref 0–99)
NonHDL: 76.35
Total CHOL/HDL Ratio: 3
Triglycerides: 67 mg/dL (ref 0.0–149.0)
VLDL: 13.4 mg/dL (ref 0.0–40.0)

## 2024-03-04 LAB — TSH: TSH: 2.4 u[IU]/mL (ref 0.35–5.50)

## 2024-03-04 LAB — MICROALBUMIN / CREATININE URINE RATIO
Creatinine,U: 218.5 mg/dL
Microalb Creat Ratio: 4.6 mg/g (ref 0.0–30.0)
Microalb, Ur: 1 mg/dL (ref 0.0–1.9)

## 2024-03-04 LAB — VITAMIN D 25 HYDROXY (VIT D DEFICIENCY, FRACTURES): VITD: 32.95 ng/mL (ref 30.00–100.00)

## 2024-03-04 MED ORDER — COVID-19 MRNA VAC-TRIS(PFIZER) 30 MCG/0.3ML IM SUSY: 0.3000 mL | PREFILLED_SYRINGE | Freq: Once | INTRAMUSCULAR | 0 refills | Status: AC

## 2024-03-04 NOTE — Patient Instructions (Signed)
 [name],  Thank you for taking the time for your Medicare Wellness Visit. I appreciate your continued commitment to your health goals. Please review the care plan we discussed, and feel free to reach out if I can assist you further.  Medicare recommends these wellness visits once per year to help you and your care team stay ahead of potential health issues. These visits are designed to focus on prevention, allowing your provider to concentrate on managing your acute and chronic conditions during your regular appointments.  Please note that Annual Wellness Visits do not include a physical exam. Some assessments may be limited, especially if the visit was conducted virtually. If needed, we may recommend a separate in-person follow-up with your provider.  Ongoing Care Seeing your primary care provider every 3 to 6 months helps us  monitor your health and provide consistent, personalized care.   Referrals If a referral was made during today's visit and you haven't received any updates within two weeks, please contact the referred provider directly to check on the status.  Recommended Screenings:  Health Maintenance  Topic Date Due   Zoster (Shingles) Vaccine (1 of 2) Never done   Yearly kidney health urinalysis for diabetes  06/12/2007   Complete foot exam   06/12/2023   Hemoglobin A1C  12/30/2023   Flu Shot  01/24/2024   COVID-19 Vaccine (5 - 2024-25 season) 02/24/2024   Medicare Annual Wellness Visit  03/03/2024   Yearly kidney function blood test for diabetes  07/01/2024   Eye exam for diabetics  07/30/2024   DEXA scan (bone density measurement)  02/09/2025   Mammogram  04/15/2025   Colon Cancer Screening  07/25/2026   DTaP/Tdap/Td vaccine (3 - Td or Tdap) 12/15/2028   Pneumococcal Vaccine for age over 60  Completed   Hepatitis C Screening  Completed   HPV Vaccine  Aged Out   Meningitis B Vaccine  Aged Out       03/04/2023    1:36 PM  Advanced Directives  Does Patient Have a  Medical Advance Directive? No  Would patient like information on creating a medical advance directive? No - Patient declined   Advance Care Planning is important because it: Ensures you receive medical care that aligns with your values, goals, and preferences. Provides guidance to your family and loved ones, reducing the emotional burden of decision-making during critical moments.  Vision: Annual vision screenings are recommended for early detection of glaucoma, cataracts, and diabetic retinopathy. These exams can also reveal signs of chronic conditions such as diabetes and high blood pressure.  Dental: Annual dental screenings help detect early signs of oral cancer, gum disease, and other conditions linked to overall health, including heart disease and diabetes.

## 2024-03-04 NOTE — Assessment & Plan Note (Signed)
Chronic Regular exercise and healthy diet encouraged Check lipid panel, CMP, TSH Continue Crestor 5 mg daily

## 2024-03-04 NOTE — Assessment & Plan Note (Signed)
 Chronic, stage 3a GFR decreased on more than 1 occasion Blood pressure well-controlled Does not take NSAIDs Stressed increased water  Encouraged weight loss CBC, CMP

## 2024-03-04 NOTE — Assessment & Plan Note (Signed)
Chronic Taking vitamin D regularly Check vitamin D level

## 2024-03-04 NOTE — Progress Notes (Signed)
 Subjective:   [name] is a 68 y.o. who presents for a Medicare Wellness preventive visit.  As a reminder, Annual Wellness Visits don't include a physical exam, and some assessments may be limited, especially if this visit is performed virtually. We may recommend an in-person follow-up visit with your provider if needed.  Visit Complete: Virtual I connected with  [name] on 03/04/24 by a audio enabled telemedicine application and verified that I am speaking with the correct person using two identifiers.  Patient Location: Home  Provider Location: Office/Clinic  I discussed the limitations of evaluation and management by telemedicine. The patient expressed understanding and agreed to proceed.  Vital Signs: Because this visit was a virtual/telehealth visit, some criteria may be missing or patient reported. Any vitals not documented were not able to be obtained and vitals that have been documented are patient reported.  VideoDeclined- This patient declined Librarian, academic. Therefore the visit was completed with audio only.  Persons Participating in Visit: Patient.  AWV Questionnaire: No: Patient Medicare AWV questionnaire was not completed prior to this visit.  Cardiac Risk Factors include: advanced age (>63men, >77 women);diabetes mellitus;dyslipidemia;hypertension;obesity (BMI >30kg/m2)     Objective:    Today's Vitals   03/04/24 1250  Weight: 284 lb (128.8 kg)  Height: 5' 4 (1.626 m)   Body mass index is 48.75 kg/m.     03/04/2024    1:03 PM 03/04/2023    1:36 PM 02/28/2022    4:15 PM 02/04/2020    4:14 PM 07/02/2018   12:30 PM 06/23/2018    3:06 PM 10/18/2017    4:24 PM  Advanced Directives  Does Patient Have a Medical Advance Directive? Yes No No No No  No  No   Type of Estate agent of [place];Living will        Copy of Healthcare Power of Attorney in Chart? No - copy requested        Would patient like  information on creating a medical advance directive?  No - Patient declined No - Patient declined No - Patient declined No - Patient declined  No - Patient declined  No - Patient declined      Data saved with a previous flowsheet row definition    Current Medications (verified) Outpatient Encounter Medications as of 03/04/2024  Medication Sig   amLODipine  (NORVASC ) 5 MG tablet TAKE 1 TABLET BY MOUTH DAILY   aspirin 81 MG tablet Take 81 mg by mouth daily.   Biotin  5000 MCG CAPS Take 1 capsule by mouth daily.    Cholecalciferol  (VITAMIN D -3) 1000 UNITS CAPS Take 1 capsule by mouth daily.   glimepiride  (AMARYL ) 2 MG tablet TAKE 1 TABLET BY MOUTH DAILY  BEFORE BREAKFAST   hydrochlorothiazide  (HYDRODIURIL ) 12.5 MG tablet TAKE 1 TABLET BY MOUTH DAILY   levocetirizine (XYZAL ) 5 MG tablet TAKE 1 TABLET BY MOUTH DAILY IN  THE EVENING   metoprolol  succinate (TOPROL -XL) 100 MG 24 hr tablet TAKE 1 TABLET BY MOUTH DAILY  WITH OR IMMEDIATELY FOLLOWING A  MEAL   rosuvastatin  (CRESTOR ) 5 MG tablet TAKE 1 TABLET BY MOUTH DAILY   No facility-administered encounter medications on file as of 03/04/2024.    Allergies (verified) Benazepril hcl   History: Past Medical History:  Diagnosis Date   Alopecia    Diabetes mellitus without complication (HCC)    Herpes zoster 1992   posterior thorax   Hyperlipidemia 2006    LDL 104, HDL 50  Hypertension    OA (osteoarthritis) of knee    Bilateral   Other abnormal glucose    Ovarian cyst    Small   Pre-diabetes    Uterine fibroid    Vitamin D  deficiency    Past Surgical History:  Procedure Laterality Date   COLONOSCOPY  2007, 07/25/2016   negative   CYSTECTOMY     peri rectal   FOOT SURGERY     bilaterally   INCONTINENCE SURGERY  2005   Dr Nikki   LIPOMA EXCISION  2011    L chest , Dr Belinda   SHOULDER ARTHROSCOPY WITH ROTATOR CUFF REPAIR AND SUBACROMIAL DECOMPRESSION Right 10/18/2017   Procedure: Right shoulder arthroscopy, subacromial  decompression, labrial debridement mini open rotator cuff repair;  Surgeon: Duwayne Purchase, MD;  Location: [facility];  Service: Orthopedics;  Laterality: Right;  120 mins   SHOULDER ARTHROSCOPY WITH ROTATOR CUFF REPAIR AND SUBACROMIAL DECOMPRESSION Left 07/02/2018   Procedure: Left shoulder arthroscopy, subacromial decompression, mini open rotator cuff repair,;  Surgeon: Duwayne Purchase, MD;  Location: [facility];  Service: Orthopedics;  Laterality: Left;  90 mins   Family History  Problem Relation Age of Onset   Diabetes Mother    Stroke Father 59   Hypertension Father    Hypertension Sister    Hypertension Maternal Aunt    Diabetes Maternal Aunt    Cancer Neg Hx    Colon cancer Neg Hx    Breast cancer Neg Hx    Social History   Socioeconomic History   Marital status: Single    Spouse name: Not on file   Number of children: Not on file   Years of education: Not on file   Highest education level: Some college, no degree  Occupational History   Not on file  Tobacco Use   Smoking status: Former    Current packs/day: 0.00    Average packs/day: 1 pack/day for 6.0 years (6.0 ttl pk-yrs)    Types: Cigarettes    Start date: 06/25/1974    Quit date: 06/25/1980    Years since quitting: 43.7   Smokeless tobacco: Never   Tobacco comments:    smoked [age] , up to 1/2 ppd  Vaping Use   Vaping status: Never Used  Substance and Sexual Activity   Alcohol use: Not Currently   Drug use: No   Sexual activity: Yes    Birth control/protection: Post-menopausal  Other Topics Concern   Not on file  Social History Narrative   Lives alone    Left handed   Caffeine: 1 cup/day at most    Was exercising 5 days/week but fell 1 month ago and has been out of the gym    Social Drivers of Corporate investment banker Strain: Low Risk  (03/04/2024)   Overall Financial Resource Strain (CARDIA)    Difficulty of Paying Living Expenses: Not hard at all  Food Insecurity: No Food Insecurity (03/04/2024)   Hunger  Vital Sign    Worried About Running Out of Food in the Last Year: Never true    Ran Out of Food in the Last Year: Never true  Transportation Needs: No Transportation Needs (03/04/2024)   PRAPARE - Administrator, Civil Service (Medical): No    Lack of Transportation (Non-Medical): No  Physical Activity: Sufficiently Active (03/04/2024)   Exercise Vital Sign    Days of Exercise per Week: 4 days    Minutes of Exercise per Session: 60 min  Stress: No  Stress Concern Present (03/04/2024)   [employer] of Occupational Health - Occupational Stress Questionnaire    Feeling of Stress: Not at all  Social Connections: Moderately Integrated (03/04/2024)   Social Connection and Isolation Panel    Frequency of Communication with Friends and Family: More than three times a week    Frequency of Social Gatherings with Friends and Family: More than three times a week    Attends Religious Services: More than 4 times per year    Active Member of [employer] or Organizations: Yes    Attends Engineer, structural: More than 4 times per year    Marital Status: Never married    Tobacco Counseling Counseling given: No Tobacco comments: smoked [age] , up to 1/2 ppd    Clinical Intake:  Pre-visit preparation completed: Yes  Pain : No/denies pain     BMI - recorded: 48.75 Nutritional Status: BMI > 30  Obese Nutritional Risks: None Diabetes: Yes CBG done?: No Did pt. bring in CBG monitor from home?: No  Lab Results  Component Value Date   HGBA1C 7.2 (H) 07/02/2023   HGBA1C 7.0 (H) 12/26/2022   HGBA1C 7.1 (H) 06/27/2022     How often do you need to have someone help you when you read instructions, pamphlets, or other written materials from your doctor or pharmacy?: 1 - Never  Interpreter Needed?: No  Information entered by :: Verdie Saba, CMA   Activities of Daily Living     03/04/2024   12:53 PM  In your present state of health, do you have any difficulty performing  the following activities:  Hearing? 0  Vision? 0  Difficulty concentrating or making decisions? 0  Walking or climbing stairs? 0  Dressing or bathing? 0  Doing errands, shopping? 0  Preparing Food and eating ? N  Using the Toilet? N  In the past six months, have you accidently leaked urine? Y  Comment wears a pad/pantyliner  Do you have problems with loss of bowel control? N  Managing your Medications? N  Managing your Finances? N  Housekeeping or managing your Housekeeping? N    Patient Care Team: Geofm Glade PARAS, MD as PCP - General (Internal Medicine)  I have updated your Care Teams any recent Medical Services you may have received from other providers in the past year.     Assessment:   This is a routine wellness examination for [name].  Hearing/Vision screen Hearing Screening - Comments:: Denies hearing difficulties   Vision Screening - Comments:: Wears rx glasses - up to date with routine eye exams with [facility]   Goals Addressed               This Visit's Progress     Patient Stated (pt-stated)        Patient stated she wants to lose more weight       Depression Screen     03/04/2024   12:55 PM 07/02/2023    2:18 PM 03/04/2023    1:37 PM 06/27/2022    2:22 PM 02/28/2022    4:40 PM 01/09/2022    4:12 PM 12/12/2021    2:16 PM  PHQ 2/9 Scores  PHQ - 2 Score 0 0 0 0 0 0 0  PHQ- 9 Score 0 0 0 0 0 0 0    Fall Risk     03/04/2024   12:54 PM 07/02/2023    2:18 PM 03/04/2023    1:36 PM 06/27/2022    2:21 PM  02/28/2022    4:41 PM  Fall Risk   Falls in the past year? 1 0 1 0 0  Number falls in past yr: 0 0 0 0 0  Comment 1      Injury with Fall? 1 0 0 0 0  Comment bruises on face  went to Urgent Care; no fractures just bruising    Risk for fall due to :  No Fall Risks  No Fall Risks No Fall Risks  Follow up Falls evaluation completed;Falls prevention discussed Falls evaluation completed Falls evaluation completed;Education provided;Falls prevention discussed Falls  evaluation completed  Falls prevention discussed      Data saved with a previous flowsheet row definition    MEDICARE RISK AT HOME:  Medicare Risk at Home Any stairs in or around the home?: Yes (outside) If so, are there any without handrails?: Yes Home free of loose throw rugs in walkways, pet beds, electrical cords, etc?: Yes Adequate lighting in your home to reduce risk of falls?: Yes Life alert?: No Use of a cane, walker or w/c?: No Grab bars in the bathroom?: No Shower chair or bench in shower?: No Elevated toilet seat or a handicapped toilet?: Yes  TIMED UP AND GO:  Was the test performed?  No  Cognitive Function: 6CIT completed        03/04/2024   12:58 PM 03/04/2023    1:38 PM 02/28/2022    4:42 PM  6CIT Screen  What Year? 0 points 0 points 0 points  What month? 0 points 0 points 0 points  What time? 0 points 0 points 0 points  Count back from 20 0 points 0 points 0 points  Months in reverse 0 points 0 points 0 points  Repeat phrase 0 points 0 points 0 points  Total Score 0 points 0 points 0 points    Immunizations Immunization History  Administered Date(s) Administered   Fluad Quad(high Dose 65+) 06/13/2021   INFLUENZA, HIGH DOSE SEASONAL PF 03/21/2023   Influenza Whole 03/24/2009   Influenza,inj,Quad PF,6+ Mos 06/08/2019, 06/01/2020   PFIZER Comirnaty(Gray Top)Covid-19 Tri-Sucrose Vaccine 03/20/2022   PFIZER(Purple Top)SARS-COV-2 Vaccination 08/23/2019, 09/16/2019   PNEUMOCOCCAL CONJUGATE-20 06/13/2021   Pfizer(Comirnaty)Fall Seasonal Vaccine 12 years and older 03/21/2023   Td 02/26/2008   Tdap 12/16/2018    Screening Tests Health Maintenance  Topic Date Due   Zoster Vaccines- Shingrix (1 of 2) Never done   Diabetic kidney evaluation - Urine ACR  06/12/2007   FOOT EXAM  06/12/2023   HEMOGLOBIN A1C  12/30/2023   Influenza Vaccine  01/24/2024   COVID-19 Vaccine (5 - 2024-25 season) 02/24/2024   Diabetic kidney evaluation - eGFR measurement  07/01/2024    OPHTHALMOLOGY EXAM  07/30/2024   DEXA SCAN  02/09/2025   Medicare Annual Wellness (AWV)  03/04/2025   MAMMOGRAM  04/15/2025   Colonoscopy  07/25/2026   DTaP/Tdap/Td (3 - Td or Tdap) 12/15/2028   Pneumococcal Vaccine: 50+ Years  Completed   Hepatitis C Screening  Completed   HPV VACCINES  Aged Out   Meningococcal B Vaccine  Aged Out    Health Maintenance Items Addressed: 03/04/2024  Additional Screening:  Vision Screening: Recommended annual ophthalmology exams for early detection of glaucoma and other disorders of the eye. Is the patient up to date with their annual eye exam?  Yes  Who is the provider or what is the name of the office in which the patient attends annual eye exams? [facility]  Dental Screening: Recommended annual dental exams  for proper oral hygiene  Community Resource Referral / Chronic Care Management: CRR required this visit?  No   CCM required this visit?  No   Plan:    I have personally reviewed and noted the following in the patient's chart:   Medical and social history Use of alcohol, tobacco or illicit drugs  Current medications and supplements including opioid prescriptions. Patient is not currently taking opioid prescriptions. Functional ability and status Nutritional status Physical activity Advanced directives List of other physicians Hospitalizations, surgeries, and ER visits in previous 12 months Vitals Screenings to include cognitive, depression, and falls Referrals and appointments  In addition, I have reviewed and discussed with patient certain preventive protocols, quality metrics, and best practice recommendations. A written personalized care plan for preventive services as well as general preventive health recommendations were provided to patient.   Verdie CHRISTELLA Saba, CMA   03/04/2024   After Visit Summary: (MyChart) Due to this being a telephonic visit, the after visit summary with patients personalized plan was offered to patient  via MyChart   Notes: Appt today w/PCP for Diabetes f/u.

## 2024-03-04 NOTE — Assessment & Plan Note (Signed)
Chronic Blood pressure well controlled CMP Continue amlodipine 5 mg daily, HCTZ 12.5 mg daily, metoprolol XL 100 mg daily

## 2024-03-04 NOTE — Assessment & Plan Note (Signed)
 Chronic Associated with CKD 3a   Lab Results  Component Value Date   HGBA1C 7.2 (H) 07/02/2023   Sugars controlled Check A1c, urine albumin/creatinine ratio Continue glimepiride  2 mg daily Stressed regular exercise, diabetic diet Encouraged weight loss

## 2024-03-04 NOTE — Assessment & Plan Note (Signed)
 Chronic DEXA up-to-date Encouraged regular exercise Taking vitamin D  daily Will check vitamin D  level

## 2024-03-04 NOTE — Assessment & Plan Note (Signed)
 Chronic Encouraged weight loss Stressed regular exercise-she will try to get back into her regular exercise Advise decrease portions, discussed healthy diet

## 2024-03-04 NOTE — Assessment & Plan Note (Signed)
 Chronic ?Using CPAP nightly ?

## 2024-03-05 ENCOUNTER — Ambulatory Visit: Payer: Self-pay | Admitting: Internal Medicine

## 2024-03-22 ENCOUNTER — Other Ambulatory Visit: Payer: Self-pay | Admitting: Internal Medicine

## 2024-03-22 DIAGNOSIS — I10 Essential (primary) hypertension: Secondary | ICD-10-CM

## 2024-04-16 ENCOUNTER — Ambulatory Visit
Admission: RE | Admit: 2024-04-16 | Discharge: 2024-04-16 | Disposition: A | Discharge status: Home / Self Care | Source: Ambulatory Visit | Attending: Internal Medicine | Admitting: Internal Medicine

## 2024-04-16 DIAGNOSIS — Z1231 Encounter for screening mammogram for malignant neoplasm of breast: Secondary | ICD-10-CM | POA: Diagnosis not present

## 2024-05-18 ENCOUNTER — Encounter: Payer: Self-pay | Admitting: *Deleted

## 2024-05-18 NOTE — Progress Notes (Signed)
 [name]                                          MRN: [id number]   05/18/2024   The [facility] Quality Team Specialist reviewed this patient medical record for the purposes of chart review for care gap closure. The following were reviewed: abstraction for care gap closure-glycemic status assessment.    [facility] Quality Team

## 2024-07-02 NOTE — Progress Notes (Unsigned)
 [name]

## 2024-07-06 ENCOUNTER — Encounter: Payer: Self-pay | Admitting: Adult Health

## 2024-07-06 ENCOUNTER — Ambulatory Visit: Payer: Medicare Other | Admitting: Adult Health

## 2024-07-06 VITALS — BP 113/65 | HR 57 | Ht 65.0 in | Wt 288.0 lb

## 2024-07-06 DIAGNOSIS — G4733 Obstructive sleep apnea (adult) (pediatric): Secondary | ICD-10-CM | POA: Diagnosis not present

## 2024-07-06 NOTE — Progress Notes (Signed)
 "   PATIENT: [name] DOB: May 23, 1956  REASON FOR VISIT: follow up HISTORY FROM: patient  Chief Complaint  Patient presents with   Follow-up    Patient in room 4 alone.  Patient is here for cpap follow up, patient states no issues or concerns at the time.  ESS score is 4     HISTORY OF PRESENT ILLNESS: Today 07/06/2024:  [name] is a 69 y.o. female with a history of OSA on CPAP. Returns today for follow-up. Reports that cpap is working well. Currently wearing dreamwear mask. Continues to find it beneficial. Download is below.      07/04/23: [name] is a 69 y.o. female with a history of OSA on CPAP  Returns today for follow-up.  Download is below.  She states that she does not like using the CPAP but understands importance of while she is using.  Denies any new issues.  Her download is below     07/19/22: [name] is a 69 y.o. female with a history of OSA on CPAP. Returns today for follow-up.  She reports that she does not like using the CPAP but she does try to use it consistently.  She states that since she is been on CPAP she has not noticed a difference in how she feels.  She questions whether the home sleep test is completely accurate since she has not noticed any changes in the way she feels.  Her download is below      07/19/21: [name] is a 69 year old female with a history of obstructive sleep apnea on CPAP.  She returns today for follow-up.  She reports that she does not like using the CPAP but understands the risk associated with untreated sleep apnea.  She did inquire about inspire however her BMI is elevated.  She is trying to work on weight loss.  She returns today for evaluation.   04/06/20: [name] is a 69 year old female with a history of obstructive sleep apnea on CPAP.  Her download indicates that she used her machine 29 out of 30 days for compliance of 97%.  She used her machine greater than 4 hours 28 days for compliance of 93%.  On  average she uses her machine 7 hours and 3 minutes.  Her residual AHI is 1.5 on 6 to 18 cm of water  with EPR 2.  Leak in the 95th percentile is 8.6.  The patient states that she has tried to remain faithful to using her CPAP.  Although she has not been able to tell a big difference.  She states that she never really had symptoms before being diagnosed.  She states that she is diabetic with hemoglobin A1c at 6.8.  She states that she has been trying to fix her diet.  Currently she only eats 1 big meal a day.  She returns today for an evaluation.    REVIEW OF SYSTEMS: Out of a complete 14 system review of symptoms, the patient complains only of the following symptoms, and all other reviewed systems are negative.  ESS 4   ALLERGIES: Allergies  Allergen Reactions   Benazepril Hcl     REACTION: angioedema of lip. She cannot take angiotensin receptor blockers because of this history.    HOME MEDICATIONS: Outpatient Medications Prior to Visit  Medication Sig Dispense Refill   amLODipine  (NORVASC ) 5 MG tablet TAKE 1 TABLET BY MOUTH DAILY 100 tablet 2   aspirin 81 MG tablet Take 81 mg by mouth daily.  Biotin  5000 MCG CAPS Take 1 capsule by mouth daily.      Cholecalciferol  (VITAMIN D -3) 1000 UNITS CAPS Take 1 capsule by mouth daily.     glimepiride  (AMARYL ) 2 MG tablet TAKE 1 TABLET BY MOUTH DAILY  BEFORE BREAKFAST 80 tablet 3   hydrochlorothiazide  (HYDRODIURIL ) 12.5 MG tablet TAKE 1 TABLET BY MOUTH DAILY 100 tablet 2   levocetirizine (XYZAL ) 5 MG tablet TAKE 1 TABLET BY MOUTH DAILY IN  THE EVENING 100 tablet 2   metoprolol  succinate (TOPROL -XL) 100 MG 24 hr tablet TAKE 1 TABLET BY MOUTH DAILY  WITH OR IMMEDIATELY FOLLOWING A  MEAL 100 tablet 2   rosuvastatin  (CRESTOR ) 5 MG tablet TAKE 1 TABLET BY MOUTH DAILY 100 tablet 2   No facility-administered medications prior to visit.    PAST MEDICAL HISTORY: Past Medical History:  Diagnosis Date   Alopecia    Diabetes mellitus without  complication (HCC)    Herpes zoster 1992   posterior thorax   Hyperlipidemia 2006    LDL 104, HDL 50   Hypertension    OA (osteoarthritis) of knee    Bilateral   Other abnormal glucose    Ovarian cyst    Small   Pre-diabetes    Uterine fibroid    Vitamin D  deficiency     PAST SURGICAL HISTORY: Past Surgical History:  Procedure Laterality Date   COLONOSCOPY  2007, 07/25/2016   negative   CYSTECTOMY     peri rectal   FOOT SURGERY     bilaterally   INCONTINENCE SURGERY  2005   Dr Nikki   LIPOMA EXCISION  2011    L chest , Dr Belinda   SHOULDER ARTHROSCOPY WITH ROTATOR CUFF REPAIR AND SUBACROMIAL DECOMPRESSION Right 10/18/2017   Procedure: Right shoulder arthroscopy, subacromial decompression, labrial debridement mini open rotator cuff repair;  Surgeon: Duwayne Purchase, MD;  Location: [facility];  Service: Orthopedics;  Laterality: Right;  120 mins   SHOULDER ARTHROSCOPY WITH ROTATOR CUFF REPAIR AND SUBACROMIAL DECOMPRESSION Left 07/02/2018   Procedure: Left shoulder arthroscopy, subacromial decompression, mini open rotator cuff repair,;  Surgeon: Duwayne Purchase, MD;  Location: [facility];  Service: Orthopedics;  Laterality: Left;  90 mins    FAMILY HISTORY: Family History  Problem Relation Age of Onset   Diabetes Mother    Stroke Father 27   Hypertension Father    Hypertension Sister    Hypertension Maternal Aunt    Diabetes Maternal Aunt    Cancer Neg Hx    Colon cancer Neg Hx    Breast cancer Neg Hx     SOCIAL HISTORY: Social History   Socioeconomic History   Marital status: Single    Spouse name: Not on file   Number of children: Not on file   Years of education: Not on file   Highest education level: Some college, no degree  Occupational History   Not on file  Tobacco Use   Smoking status: Former    Current packs/day: 0.00    Average packs/day: 1 pack/day for 6.0 years (6.0 ttl pk-yrs)    Types: Cigarettes    Start date: 06/25/1974    Quit date: 06/25/1980    Years  since quitting: 44.0   Smokeless tobacco: Never   Tobacco comments:    smoked 16-25 , up to 1/2 ppd  Vaping Use   Vaping status: Never Used  Substance and Sexual Activity   Alcohol use: Not Currently   Drug use: No   Sexual activity: Yes  Birth control/protection: Post-menopausal  Other Topics Concern   Not on file  Social History Narrative   Lives alone    Left handed   Caffeine: 1 cup/day at most    Was exercising 5 days/week but fell 1 month ago and has been out of the gym    Patient is retired    Chief Executive Officer Drivers of Health   Tobacco Use: Medium Risk (03/04/2024)   Patient History    Smoking Tobacco Use: Former    Smokeless Tobacco Use: Never    Passive Exposure: Not on Actuary Strain: Low Risk (03/04/2024)   Overall Financial Resource Strain (CARDIA)    Difficulty of Paying Living Expenses: Not hard at all  Food Insecurity: No Food Insecurity (03/04/2024)   Epic    Worried About Programme Researcher, Broadcasting/film/video in the Last Year: Never true    Ran Out of Food in the Last Year: Never true  Transportation Needs: No Transportation Needs (03/04/2024)   Epic    Lack of Transportation (Medical): No    Lack of Transportation (Non-Medical): No  Physical Activity: Sufficiently Active (03/04/2024)   Exercise Vital Sign    Days of Exercise per Week: 4 days    Minutes of Exercise per Session: 60 min  Stress: No Stress Concern Present (03/04/2024)   [employer] of Occupational Health - Occupational Stress Questionnaire    Feeling of Stress: Not at all  Social Connections: Moderately Integrated (03/04/2024)   Social Connection and Isolation Panel    Frequency of Communication with Friends and Family: More than three times a week    Frequency of Social Gatherings with Friends and Family: More than three times a week    Attends Religious Services: More than 4 times per year    Active Member of Clubs or Organizations: Yes    Attends Banker Meetings: More than 4  times per year    Marital Status: Never married  Intimate Partner Violence: Not At Risk (03/04/2024)   Epic    Fear of Current or Ex-Partner: No    Emotionally Abused: No    Physically Abused: No    Sexually Abused: No  Depression (PHQ2-9): Low Risk (03/04/2024)   Depression (PHQ2-9)    PHQ-2 Score: 0  Alcohol Screen: Low Risk (03/04/2024)   Alcohol Screen    Last Alcohol Screening Score (AUDIT): 0  Housing: Unknown (03/04/2024)   Epic    Unable to Pay for Housing in the Last Year: No    Number of Times Moved in the Last Year: Not on file    Homeless in the Last Year: No  Utilities: Not At Risk (03/04/2024)   Epic    Threatened with loss of utilities: No  Health Literacy: Adequate Health Literacy (03/04/2024)   B1300 Health Literacy    Frequency of need for help with medical instructions: Never      PHYSICAL EXAM  Vitals:   07/06/24 1253  BP: 113/65  Pulse: (!) 57  Weight: 288 lb (130.6 kg)  Height: 5' 5 (1.651 m)     Body mass index is 47.93 kg/m.  Generalized: Well developed, in no acute distress  Chest: Lungs clear to auscultation bilaterally  Neurological examination  Mentation: Alert oriented to time, place, history taking. Follows all commands speech and language fluent Gait and station: Gait is normal.    DIAGNOSTIC DATA (LABS, IMAGING, TESTING) - I reviewed patient records, labs, notes, testing and imaging myself where available.  Lab Results  Component Value Date   WBC 7.9 03/04/2024   HGB 13.1 03/04/2024   HCT 41.1 03/04/2024   MCV 88.4 03/04/2024   PLT 249.0 03/04/2024      Component Value Date/Time   NA 139 03/04/2024 1458   K 3.7 03/04/2024 1458   CL 103 03/04/2024 1458   CO2 30 03/04/2024 1458   GLUCOSE 85 03/04/2024 1458   BUN 17 03/04/2024 1458   CREATININE 0.97 03/04/2024 1458   CREATININE 1.12 (H) 08/11/2020 1548   CALCIUM  9.6 03/04/2024 1458   PROT 7.6 03/04/2024 1458   ALBUMIN 4.0 03/04/2024 1458   AST 20 03/04/2024 1458   ALT  14 03/04/2024 1458   ALKPHOS 79 03/04/2024 1458   BILITOT 0.5 03/04/2024 1458   GFRNONAA 60 (L) 06/23/2018 1531   GFRAA >60 06/23/2018 1531   Lab Results  Component Value Date   CHOL 122 03/04/2024   HDL 46.10 03/04/2024   LDLCALC 63 03/04/2024   TRIG 67.0 03/04/2024   CHOLHDL 3 03/04/2024   Lab Results  Component Value Date   HGBA1C 6.7 (H) 03/04/2024   No results found for: VITAMINB12 Lab Results  Component Value Date   TSH 2.40 03/04/2024      ASSESSMENT AND PLAN 69 y.o. year old female  has a past medical history of Alopecia, Diabetes mellitus without complication (HCC), Herpes zoster (1992), Hyperlipidemia (2006), Hypertension, OA (osteoarthritis) of knee, Other abnormal glucose, Ovarian cyst, Pre-diabetes, Uterine fibroid, and Vitamin D  deficiency. here with:  OSA on CPAP  - CPAP compliance excellent - Good treatment of AHI  - Encourage patient to use CPAP nightly and > 4 hours each night - F/U in 1 year or sooner if needed    Duwaine Russell, MSN, NP-C 07/06/2024, 1:02 PM [facility] [address], [address] [place], [place] [address] [phone]  The patient's condition requires frequent monitoring and adjustments in the treatment plan, reflecting the ongoing complexity of care.  This provider is the continuing focal point for all needed services for this condition.  "

## 2024-07-06 NOTE — Patient Instructions (Signed)
 Your Plan:  Continue CPAP therapy If your symptoms worsen or you develop new symptoms please let us  know.   Thank you for coming to see us  at [facility]. I hope we have been able to provide you high quality care today.  You may receive a patient satisfaction survey over the next few weeks. We would appreciate your feedback and comments so that we may continue to improve ourselves and the health of our patients.

## 2024-09-02 ENCOUNTER — Ambulatory Visit: Admitting: Internal Medicine

## 2025-07-08 ENCOUNTER — Ambulatory Visit: Admitting: Adult Health
